# Patient Record
Sex: Female | Born: 1960
Health system: Southern US, Community
[De-identification: ages and names within clinical notes are randomized; demographics above are authoritative.]

## PROBLEM LIST (undated history)

## (undated) DIAGNOSIS — F32A Depression, unspecified: Secondary | ICD-10-CM

## (undated) DIAGNOSIS — F329 Major depressive disorder, single episode, unspecified: Secondary | ICD-10-CM

## (undated) DIAGNOSIS — I10 Essential (primary) hypertension: Secondary | ICD-10-CM

## (undated) DIAGNOSIS — M1990 Unspecified osteoarthritis, unspecified site: Secondary | ICD-10-CM

## (undated) DIAGNOSIS — G43909 Migraine, unspecified, not intractable, without status migrainosus: Secondary | ICD-10-CM

## (undated) DIAGNOSIS — E669 Obesity, unspecified: Secondary | ICD-10-CM

## (undated) HISTORY — PX: BREAST MASS EXCISION: SHX1267

## (undated) HISTORY — PX: ABDOMINAL HYSTERECTOMY: SHX81

## (undated) HISTORY — DX: Unspecified osteoarthritis, unspecified site: M19.90

## (undated) HISTORY — DX: Essential (primary) hypertension: I10

## (undated) HISTORY — DX: Migraine, unspecified, not intractable, without status migrainosus: G43.909

## (undated) HISTORY — DX: Depression, unspecified: F32.A

## (undated) HISTORY — DX: Obesity, unspecified: E66.9

---
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
## 1998-07-04 ENCOUNTER — Emergency Department (HOSPITAL_COMMUNITY): Admission: EM | Admit: 1998-07-04 | Discharge: 1998-07-04 | Payer: Self-pay | Admitting: Emergency Medicine

## 1999-07-12 ENCOUNTER — Encounter: Payer: Self-pay | Admitting: Emergency Medicine

## 1999-07-12 ENCOUNTER — Emergency Department (HOSPITAL_COMMUNITY): Admission: EM | Admit: 1999-07-12 | Discharge: 1999-07-12 | Payer: Self-pay | Admitting: Emergency Medicine

## 2000-04-19 ENCOUNTER — Other Ambulatory Visit: Admission: RE | Admit: 2000-04-19 | Discharge: 2000-04-19 | Payer: Self-pay | Admitting: Obstetrics and Gynecology

## 2003-06-22 ENCOUNTER — Emergency Department (HOSPITAL_COMMUNITY): Admission: EM | Admit: 2003-06-22 | Discharge: 2003-06-23 | Payer: Self-pay | Admitting: Emergency Medicine

## 2003-06-26 ENCOUNTER — Emergency Department (HOSPITAL_COMMUNITY): Admission: EM | Admit: 2003-06-26 | Discharge: 2003-06-26 | Payer: Self-pay | Admitting: Emergency Medicine

## 2003-07-01 ENCOUNTER — Emergency Department (HOSPITAL_COMMUNITY): Admission: EM | Admit: 2003-07-01 | Discharge: 2003-07-01 | Payer: Self-pay | Admitting: *Deleted

## 2003-12-22 ENCOUNTER — Emergency Department (HOSPITAL_COMMUNITY): Admission: EM | Admit: 2003-12-22 | Discharge: 2003-12-22 | Payer: Self-pay | Admitting: Emergency Medicine

## 2007-01-09 ENCOUNTER — Encounter: Admission: RE | Admit: 2007-01-09 | Discharge: 2007-01-09 | Payer: Self-pay | Admitting: Obstetrics and Gynecology

## 2007-02-06 ENCOUNTER — Encounter (INDEPENDENT_AMBULATORY_CARE_PROVIDER_SITE_OTHER): Payer: Self-pay | Admitting: Obstetrics and Gynecology

## 2007-02-06 ENCOUNTER — Ambulatory Visit (HOSPITAL_COMMUNITY): Admission: RE | Admit: 2007-02-06 | Discharge: 2007-02-07 | Payer: Self-pay | Admitting: Obstetrics and Gynecology

## 2008-01-28 ENCOUNTER — Emergency Department (HOSPITAL_COMMUNITY): Admission: EM | Admit: 2008-01-28 | Discharge: 2008-01-29 | Payer: Self-pay | Admitting: Emergency Medicine

## 2009-11-25 ENCOUNTER — Emergency Department (HOSPITAL_COMMUNITY): Admission: EM | Admit: 2009-11-25 | Discharge: 2009-11-25 | Payer: Self-pay | Admitting: Emergency Medicine

## 2009-12-16 ENCOUNTER — Emergency Department (HOSPITAL_COMMUNITY): Admission: EM | Admit: 2009-12-16 | Discharge: 2009-12-16 | Payer: Self-pay | Admitting: Emergency Medicine

## 2009-12-29 ENCOUNTER — Emergency Department (HOSPITAL_COMMUNITY): Admission: EM | Admit: 2009-12-29 | Discharge: 2009-12-29 | Payer: Self-pay | Admitting: Emergency Medicine

## 2010-01-02 ENCOUNTER — Emergency Department (HOSPITAL_COMMUNITY): Admission: EM | Admit: 2010-01-02 | Discharge: 2010-01-02 | Payer: Self-pay | Admitting: Emergency Medicine

## 2010-01-07 ENCOUNTER — Emergency Department (HOSPITAL_COMMUNITY): Admission: EM | Admit: 2010-01-07 | Discharge: 2010-01-07 | Payer: Self-pay | Admitting: Emergency Medicine

## 2010-01-13 ENCOUNTER — Emergency Department (HOSPITAL_COMMUNITY): Admission: EM | Admit: 2010-01-13 | Discharge: 2010-01-13 | Payer: Self-pay | Admitting: Emergency Medicine

## 2010-01-16 ENCOUNTER — Emergency Department (HOSPITAL_COMMUNITY): Admission: EM | Admit: 2010-01-16 | Discharge: 2010-01-16 | Payer: Self-pay | Admitting: Emergency Medicine

## 2010-01-19 ENCOUNTER — Emergency Department (HOSPITAL_COMMUNITY): Admission: EM | Admit: 2010-01-19 | Discharge: 2010-01-19 | Payer: Self-pay | Admitting: Emergency Medicine

## 2010-01-20 ENCOUNTER — Emergency Department (HOSPITAL_COMMUNITY): Admission: EM | Admit: 2010-01-20 | Discharge: 2010-01-20 | Payer: Self-pay | Admitting: Emergency Medicine

## 2010-05-28 ENCOUNTER — Emergency Department (HOSPITAL_COMMUNITY): Admission: EM | Admit: 2010-05-28 | Discharge: 2009-12-26 | Payer: Self-pay | Admitting: Emergency Medicine

## 2010-11-03 NOTE — H&P (Signed)
NAMEALLIAH, Jennifer Waters            ACCOUNT NO.:  [id number]   MEDICAL RECORD NO.:  [id number]          PATIENT TYPE:  AMB   LOCATION:  SDC                           FACILITY:  [facility]   PHYSICIAN:  Janine Limbo, M.D.DATE OF BIRTH:  07-13-1960   DATE OF ADMISSION:  02/06/2007  DATE OF DISCHARGE:                              HISTORY & PHYSICAL   HISTORY OF PRESENT ILLNESS:  Jennifer Waters is a 50 year old female, para  0-1-0-1, who presents for a vaginal hysterectomy.  The patient has been  followed at the [facility]  [facility].  The patient complains of irregular  bleeding, dysmenorrhea, and fibroids.  Her evaluation included an  ultrasound which showed a uterus that measured 10.7 cm x 9.5 cm x 7.5  cm.  Her ovaries appeared normal.  Multiple fibroids were seen with the  largest fibroid measuring 5.3 cm in size.  The patient has had a prior  tubal ligation.  The patient had an endometrial biopsy that showed  benign elements.  Her most recent Pap smear was within normal limits.  Her most recent mammogram was within normal limits.  The patient does  have a past history of herpes simplex virus but she denies any other  sexually transmitted infections.  She has had a tubal ligation in the  past.   OBSTETRICAL HISTORY:  In 1991 the patient had a preterm vaginal  delivery.   DRUG ALLERGIES:  No known drug allergies.  No latex allergies.  No  Betadine allergies.   PAST MEDICAL HISTORY:  The patient has a history of hypertension,  depression/anxiety, migraine headaches, and seasonal allergies.   CURRENT MEDICATIONS:  Atenolol, Effexor, Zyrtec, ibuprofen, and Relpax.   She did have one wisdom tooth removed in 1990.  She has had a tubal  ligation as mentioned above.   SOCIAL HISTORY:  The patient smokes one pack of cigarettes each day.  She drinks alcohol socially.  She denies other recreational drug uses.  She is  currently divorced.   REVIEW OF SYSTEMS:  Please see history of present illness and past  medical history.   FAMILY HISTORY:  The patient has a family history of hypertension,  emphysema, and diabetes.   PHYSICAL EXAM:  Weight is 217 pounds.  Height is 5 feet 8 inches.  HEENT:  Within normal limits.  CHEST:  Show smoker's rhonchi.  HEART:  Regular rate and rhythm.  BREASTS:  Without masses.  ABDOMEN:  Nontender.  NEUROLOGIC:  Grossly normal.  EXTREMITIES:  Grossly normal.  PELVIC EXAM:  External genitalia is normal.  Vagina is normal.  Cervix  is nontender.  The uterus is 10-12 weeks' size and irregular, adnexa, no  masses. Rectovaginal exam confirms.   ASSESSMENT:  1. Fibroid uterus.  2. Menorrhagia.  3. Dysmenorrhea.  4. Hypertension.  5. Depression/anxiety.  6. Migraine headaches.  7. Seasonal allergies.  8. Obesity.   PLAN:  The patient will undergo a vaginal hysterectomy.  She understands  the indications for her surgical procedure as well as the alternative  treatment options.  She accepts  the risks of, but not limited to,  anesthetic complications, bleeding, infection, and possible damage to  the surrounding organs.      Janine Limbo, M.D.  Electronically Signed     AVS/MEDQ  D:  02/01/2007  T:  02/02/2007  Job:  [id number]

## 2010-11-03 NOTE — Op Note (Signed)
NAMELEE-ANNE, FLICKER            ACCOUNT NO.:  [id number]   MEDICAL RECORD NO.:  [id number]          PATIENT TYPE:  AMB   LOCATION:  SDC                           FACILITY:  [facility]   PHYSICIAN:  Janine Limbo, M.D.DATE OF BIRTH:  17-Mar-1961   DATE OF PROCEDURE:  02/06/2007  DATE OF DISCHARGE:                               OPERATIVE REPORT   PREOPERATIVE DIAGNOSIS:  1. Fibroid uterus.  2. Dysmenorrhea.  3. Menorrhagia.   POSTOPERATIVE DIAGNOSIS:  1. Fibroid uterus.  2. Dysmenorrhea.  3. Menorrhagia.   PROCEDURE:  Vaginal hysterectomy with the uterine morcellation.   SURGEON:  Dr. Leonard Schwartz   FIRST ASSISTANT:  Dois Davenport A. Rivard, M.D.   ANESTHETIC:  Is a general.   DISPOSITION:  Ms. Nembhard is a 50 year old female, para 0-1-0-1, who  presents with the above-mentioned diagnosis.  The patient understands  the indications for her surgical procedure and she accepts the risks of,  but not limited to, anesthetic complications, bleeding, infection, and  possible damage to surrounding organs.   FINDINGS:  The patient was noted to have a 14-week size multi fibroid  uterus.  The fallopian tubes and ovaries appeared normal.  The estimated  weight of the uterus was approximately 350 grams.   PROCEDURE:  The patient was taken to the operating room where general  anesthetic was given.  The patient's abdomen, perineum, and vagina were  prepped with multiple layers of Betadine.  A Foley catheter was placed  in the bladder.  The patient was sterilely draped.  Examination under  anesthesia was performed.  The cervix was injected with 20 mL of a  diluted solution of Pitressin and saline.  A circumferential incision  was made around the cervix and the vaginal mucosa was advanced  anteriorly and posteriorly.  The posterior cul-de-sac was sharply  entered.  Alternating from right to left uterosacral ligaments,  paracervical tissues, parametrial tissues, and uterine arteries  were  clamped, cut, sutured, and tied securely.  The anterior cul-de-sac was  sharply entered.  Attempts were made to invert the uterus to the  posterior colpotomy but these attempts were unsuccessful.  We then began  a sequential morcellation of the uterus.  Multiple fibroids were removed  through the posterior colpotomy.  The uterus was cut into multiple small  pieces.  We were finally able to isolate the upper pedicles with Heaney  clamps.  The uterus was transected from the adnexa and the uterus was  removed from the operative field.  The sutures attached to the  uterosacral ligaments were brought out through the vaginal angles.  A  McCall culdoplasty suture was placed in the posterior cul-de-sac  incorporating the uterosacral ligaments bilaterally.  Figure-of-eight  sutures were placed at the right adnexa when a small area of bleeding  was encountered.  Hemostasis was confirmed throughout at this point.  The vaginal cuff was closed using figure-of-eight sutures incorporating  the anterior vaginal mucosa, the anterior peritoneum, the posterior  peritoneum, and the posterior vaginal mucosa.  All sutures were tied  securely.  The McCall culdoplasty suture was tied and the apex of  the  vagina was noted to elevate into the mid pelvis.  Sponge, needle,  instrument counts were correct on two occasions.  Estimated blood loss  for the procedure was 250 mL.  The patient tolerated her procedure well.  She was awakened from her anesthetic and taken to the recovery room in  stable condition.  0 Vicryl suture material used throughout the  procedure.  The uterus (which had been morcellated) was taken to  pathology area for evaluation.  The patient was taken to the recovery  room in stable condition.  She was noted to drain 100 mL of clear yellow  urine.      Janine Limbo, M.D.  Electronically Signed     AVS/MEDQ  D:  02/06/2007  T:  02/06/2007  Job:  [id number]

## 2010-11-06 NOTE — Discharge Summary (Signed)
Jennifer Waters            ACCOUNT NO.:  [id number]   MEDICAL RECORD NO.:  [id number]          PATIENT TYPE:  OIB   LOCATION:  9317                          FACILITY:  [facility]   PHYSICIAN:  Janine Limbo, M.D.DATE OF BIRTH:  12-01-1960   DATE OF ADMISSION:  02/06/2007  DATE OF DISCHARGE:  02/07/2007                               DISCHARGE SUMMARY   DISCHARGE DIAGNOSES:  1. Symptomatic uterine fibroids.  2. Adenomyosis.  3. Menorrhagia.  4. Dysmenorrhea.   OPERATION:  On the day of admission, the patient underwent a vaginal  hysterectomy with uterine morcellation, tolerating procedure well.  The  patient was found to have a 14-week size, multi fibroid uterus along  with fallopian tubes and ovaries that appeared normal.  Estimated weight  of the uterus was approximately 350 grams.   HISTORY OF PRESENT ILLNESS:  Jennifer Waters is a 50 year old female, para  0-1-0-1, who presents for a vaginal hysterectomy because of symptomatic  uterine fibroids.  Please see the patient's dictated History and  Physical examination for details.  Preoperative physical exam showed  weight 217 pounds, height 5 feet 8 inches tall. General exam was within  normal limits.  Pelvic exam:  External genitalia was normal, vagina  normal, cervix nontender.  Uterus was 10 to 12-week size, irregular.  Adnexa was without masses.  Rectovaginal exam confirms.   HOSPITAL COURSE:  On the day of admission, the patient underwent  aforementioned procedures, tolerating them all well.  Postoperative  course was unremarkable with the patient tolerating a postop hemoglobin  of 7.5 (preop hemoglobin 12.8) and, therefore, declining blood  transfusion.  The patient had resumed bowel and bladder function by  postop day #1 and was, therefore, deemed ready for discharge home.   Discharge medications are not available at the time of this dictation.   FOLLOW UP:  The patient is to call [facility] at [phone]  to schedule a 6-week postoperative visit with Dr. Stefano Gaul.   DISCHARGE INSTRUCTIONS:  The patient was given a copy of [address]  [facility] postoperative instruction sheet.  She was further  advised to avoid driving for 1-2 weeks, heavy lifting for 4 weeks,  intercourse for 6 weeks. She may shower. She may walk up steps.  She  should increase her activities slowly.  Her diet was without  restriction.   FINAL PATHOLOGY:  Uterus hysterectomy:  Uterine cervix:  Benign  ectocervical and endocervical mucosa.  Uterine corpus:  Benign  proliferative endometrium, adenomyosis and multiple submucosal  intramural and subserosal leiomyomata.      Jennifer Waters.      Janine Limbo, M.D.  Electronically Signed    EJP/MEDQ  D:  03/04/2007  T:  03/05/2007  Job:  [id number]

## 2011-03-19 LAB — POCT I-STAT, CHEM 8
HCT: 38
Hemoglobin: 12.9
Potassium: 3.7
Sodium: 143

## 2011-03-19 LAB — POCT CARDIAC MARKERS
CKMB, poc: <1 — ABNORMAL LOW
Myoglobin, poc: 37.4

## 2011-04-02 LAB — URINALYSIS, ROUTINE W REFLEX MICROSCOPIC
Bilirubin Urine: NEGATIVE
Ketones, ur: NEGATIVE
Nitrite: NEGATIVE
Protein, ur: NEGATIVE
pH: 5.5

## 2011-04-02 LAB — CBC
HCT: 24 — ABNORMAL LOW
HCT: 37.7
Hemoglobin: 12.8
Hemoglobin: 7.5 — CL
Hemoglobin: 8.1 — ABNORMAL LOW
MCHC: 33.7
MCHC: 33.8
MCV: 91
Platelets: 311
RBC: 2.46 — ABNORMAL LOW
RBC: 2.68 — ABNORMAL LOW
RDW: 13.6
WBC: 9.2

## 2011-04-02 LAB — BASIC METABOLIC PANEL
GFR calc Af Amer: >60
GFR calc non Af Amer: >60
Potassium: 3.7
Sodium: 138

## 2011-04-02 LAB — URINE MICROSCOPIC-ADD ON

## 2014-10-17 ENCOUNTER — Ambulatory Visit (INDEPENDENT_AMBULATORY_CARE_PROVIDER_SITE_OTHER): Payer: BLUE CROSS/BLUE SHIELD | Admitting: Family

## 2014-10-17 ENCOUNTER — Other Ambulatory Visit (INDEPENDENT_AMBULATORY_CARE_PROVIDER_SITE_OTHER): Payer: BLUE CROSS/BLUE SHIELD

## 2014-10-17 ENCOUNTER — Encounter: Payer: Self-pay | Admitting: Family

## 2014-10-17 VITALS — BP 144/100 | HR 92 | Temp 98.1°F | Resp 18 | Ht 69.0 in | Wt 225.0 lb

## 2014-10-17 DIAGNOSIS — I10 Essential (primary) hypertension: Secondary | ICD-10-CM | POA: Insufficient documentation

## 2014-10-17 DIAGNOSIS — M159 Polyosteoarthritis, unspecified: Secondary | ICD-10-CM | POA: Diagnosis not present

## 2014-10-17 DIAGNOSIS — M62838 Other muscle spasm: Secondary | ICD-10-CM | POA: Insufficient documentation

## 2014-10-17 DIAGNOSIS — M1990 Unspecified osteoarthritis, unspecified site: Secondary | ICD-10-CM | POA: Insufficient documentation

## 2014-10-17 LAB — BASIC METABOLIC PANEL
BUN: 16 mg/dL (ref 6–23)
CALCIUM: 9.5 mg/dL (ref 8.4–10.5)
CO2: 30 mEq/L (ref 19–32)
Chloride: 104 mEq/L (ref 96–112)
Creatinine, Ser: 0.9 mg/dL (ref 0.40–1.20)
GFR: 84.26 mL/min (ref >60.00)
Glucose, Bld: 105 mg/dL — ABNORMAL HIGH (ref 70–99)
Potassium: 4.4 mEq/L (ref 3.5–5.1)
SODIUM: 139 meq/L (ref 135–145)

## 2014-10-17 LAB — MAGNESIUM: Magnesium: 2 mg/dL (ref 1.5–2.5)

## 2014-10-17 LAB — PHOSPHORUS: PHOSPHORUS: 3.5 mg/dL (ref 2.3–4.6)

## 2014-10-17 MED ORDER — AMLODIPINE BESYLATE 5 MG PO TABS: 5.0000 mg | ORAL_TABLET | Freq: Every day | ORAL | Status: DC

## 2014-10-17 NOTE — Progress Notes (Signed)
Subjective:    Patient ID: Jennifer Waters, female    DOB: 05/21/1961, 54 y.o.   MRN: [id number]  Chief Complaint  Patient presents with  . Establish Care    states that she gets muscle spasms all over her body, it has been going on for years, happens randomly and getting worse, has pain in hands and joints,     HPI:  Jennifer GIAMMONA is a 54 y.o. female with a PMH of arthritis, hypertension, and migraines who presents today for an office visit to establish care.   1) Muscle spasms - Associated symptom of muscle spasm located all over her body has been going on for several years. Notes that a lot of the muscle cramps/spasms occurs around her abdomen and back. Described as sharp and severity ranges depending upon where it is on her body. Modifying factor includes pushing on them or any over the counter medications. Muscles spasms have gotten worse. Indicates that her caffeine consumption is about 8-10 cups per day.   2) Pain in hands / joints - associated pain located in multiple joints, but specifically around her right thumb which has been going on for a couple of years. Indicates that the right thumb is worse then the left thumb. Denies any modifying factors that make it better or worse.    No Known Allergies   No current outpatient prescriptions on file prior to visit.   No current facility-administered medications on file prior to visit.    Past Medical History  Diagnosis Date  . Arthritis   . Migraines   . Hypertension     Past Surgical History  Procedure Laterality Date  . Abdominal hysterectomy      Family History  Problem Relation Age of Onset  . Arthritis Mother   . Ovarian cancer Mother   . Migraines Mother     History   Social History  . Marital Status: Divorced    Spouse Name: N/A  . Number of Children: 1  . Years of Education: 14   Occupational History  . Supervisor    Social History Main Topics  . Smoking status: Current Some Day Smoker      Types: Cigarettes  . Smokeless tobacco: Never Used  . Alcohol Use: 3.0 oz/week    0 Standard drinks or equivalent, 5 Glasses of wine per week  . Drug Use: No  . Sexual Activity: Yes    Birth Control/ Protection: Surgical   Other Topics Concern  . Not on file   Social History Narrative   Fun: Puzzles, cats   Denies any religious beliefs effecting health care.     Review of Systems  Constitutional: Negative for fever and chills.  Musculoskeletal: Positive for arthralgias.       Positive for muscle cramping.       Objective:    BP 144/100 mmHg  Pulse 92  Temp(Src) 98.1 F (36.7 C) (Oral)  Resp 18  Ht 5\' 9"  (1.753 m)  Wt 225 lb (102.059 kg)  BMI 33.21 kg/m2  SpO2 94% Nursing note and vital signs reviewed.  Physical Exam  Constitutional: She is oriented to person, place, and time. She appears well-developed and well-nourished. No distress.  Cardiovascular: Normal rate, regular rhythm, normal heart sounds and intact distal pulses.   Pulmonary/Chest: Effort normal and breath sounds normal.  Musculoskeletal:  Right thumb: No obvious deformity, discoloration, or edema right thumb noted. Mild tenderness elicited over MCP joint. Some range of motion and strength are  normal. Ligamentous testing is negative.  Neurological: She is alert and oriented to person, place, and time.  Skin: Skin is warm and dry.  Psychiatric: She has a normal mood and affect. Her behavior is normal. Judgment and thought content normal.       Assessment & Plan:

## 2014-10-17 NOTE — Assessment & Plan Note (Signed)
Right thumb symptoms consistent with osteoarthritis. Treat conservatively at this time with nonsteroidal anti-inflammatories as needed, ice after activity, and a thumb spica splint as needed. If symptoms worsen consider imaging and potential steroid injection.

## 2014-10-17 NOTE — Progress Notes (Signed)
Pre visit review using our clinic review tool, if applicable. No additional management support is needed unless otherwise documented below in the visit note. 

## 2014-10-17 NOTE — Patient Instructions (Signed)
Thank you for choosing [facility].  Summary/Instructions:   For your thumb recommend ice after activity and possible thumb spica splint as needed. May also use tylenol, ibuprofen or aleve.   Please stop by the lab on the basement level of the building for your blood work. Your results will be released to [facility] (or called to you) after review, usually within 72 hours after test completion. If any changes need to be made, you will be notified at that same time.  If your symptoms worsen or fail to improve, please contact our office for further instruction, or in case of emergency go directly to the emergency room at the closest medical facility.    Osteoarthritis Osteoarthritis is a disease that causes soreness and inflammation of a joint. It occurs when the cartilage at the affected joint wears down. Cartilage acts as a cushion, covering the ends of bones where they meet to form a joint. Osteoarthritis is the most common form of arthritis. It often occurs in older people. The joints affected most often by this condition include those in the:  Ends of the fingers.  Thumbs.  Neck.  Lower back.  Knees.  Hips. CAUSES  Over time, the cartilage that covers the ends of bones begins to wear away. This causes bone to rub on bone, producing pain and stiffness in the affected joints.  RISK FACTORS Certain factors can increase your chances of having osteoarthritis, including:  Older age.  Excessive body weight.  Overuse of joints.  Previous joint injury. SIGNS AND SYMPTOMS   Pain, swelling, and stiffness in the joint.  Over time, the joint may lose its normal shape.  Small deposits of bone (osteophytes) may grow on the edges of the joint.  Bits of bone or cartilage can break off and float inside the joint space. This may cause more pain and damage. DIAGNOSIS  Your health care provider will do a physical exam and ask about your symptoms. Various tests may be ordered, such  as:  X-rays of the affected joint.  An MRI scan.  Blood tests to rule out other types of arthritis.  Joint fluid tests. This involves using a needle to draw fluid from the joint and examining the fluid under a microscope. TREATMENT  Goals of treatment are to control pain and improve joint function. Treatment plans may include:  A prescribed exercise program that allows for rest and joint relief.  A weight control plan.  Pain relief techniques, such as:  Properly applied heat and cold.  Electric pulses delivered to nerve endings under the skin (transcutaneous electrical nerve stimulation [TENS]).  Massage.  Certain nutritional supplements.  Medicines to control pain, such as:  Acetaminophen.  Nonsteroidal anti-inflammatory drugs (NSAIDs), such as naproxen.  Narcotic or central-acting agents, such as tramadol.  Corticosteroids. These can be given orally or as an injection.  Surgery to reposition the bones and relieve pain (osteotomy) or to remove loose pieces of bone and cartilage. Joint replacement may be needed in advanced states of osteoarthritis. HOME CARE INSTRUCTIONS   Take medicines only as directed by your health care provider.  Maintain a healthy weight. Follow your health care provider's instructions for weight control. This may include dietary instructions.  Exercise as directed. Your health care provider can recommend specific types of exercise. These may include:  Strengthening exercises. These are done to strengthen the muscles that support joints affected by arthritis. They can be performed with weights or with exercise bands to add resistance.  Aerobic activities.  These are exercises, such as brisk walking or low-impact aerobics, that get your heart pumping.  Range-of-motion activities. These keep your joints limber.  Balance and agility exercises. These help you maintain daily living skills.  Rest your affected joints as directed by your health care  provider.  Keep all follow-up visits as directed by your health care provider. SEEK MEDICAL CARE IF:   Your skin turns red.  You develop a rash in addition to your joint pain.  You have worsening joint pain.  You have a fever along with joint or muscle aches. SEEK IMMEDIATE MEDICAL CARE IF:  You have a significant loss of weight or appetite.  You have night sweats. [place] of Arthritis and Musculoskeletal and Skin Diseases: www.niams.SouthExposed.es  [employer] on Aging: http://kim-miller.com/  American College of Rheumatology: www.rheumatology.org Document Released: 06/07/2005 Document Revised: 10/22/2013 Document Reviewed: 02/12/2013 [facility] Patient Information 2015 [place], [place]. This information is not intended to replace advice given to you by your health care provider. Make sure you discuss any questions you have with your health care provider.

## 2014-10-17 NOTE — Assessment & Plan Note (Signed)
No muscle spasms experienced during visit. Obtain basic metabolic panel, magnesium, and phosphorus to rule out metabolic causes. Patient instructed to decrease her caffeine intake and increase her water intake. Also discussed stretching muscles as needed. Follow up pending lab work.

## 2014-10-17 NOTE — Assessment & Plan Note (Signed)
Hypertension not currently treated with medications. She is being treated with atenolol for her headaches, which has had minimal effect on her blood pressure. It remains above goal of 140/90. Start amlodipine 5 mg daily. Follow-up in one month to check blood pressure.

## 2014-10-19 ENCOUNTER — Telehealth: Payer: Self-pay | Admitting: Family

## 2014-10-19 NOTE — Telephone Encounter (Signed)
Please inform the patient that her kidney function and electrolytes are all within the normal limits which does not give [name] a reason for her cramping feeling all over her body. Please have her continue to monitor the cramping for potential causes

## 2014-10-19 NOTE — Telephone Encounter (Signed)
Please inform the patient that her kidney function and electrolytes are all within the normal limits which does not give [name] a reason for her cramping feeling all over her body. Please have her continue to monitor the cramping for potential causes.

## 2014-10-21 NOTE — Telephone Encounter (Signed)
LVM for pt to call back.

## 2014-10-21 NOTE — Telephone Encounter (Signed)
Pt aware of results 

## 2014-10-21 NOTE — Telephone Encounter (Signed)
Pt stated she is at work and not able to answer.  Please call and left it on her VM

## 2015-05-01 ENCOUNTER — Encounter: Payer: Self-pay | Admitting: Family

## 2015-05-01 ENCOUNTER — Ambulatory Visit (INDEPENDENT_AMBULATORY_CARE_PROVIDER_SITE_OTHER): Payer: BLUE CROSS/BLUE SHIELD | Admitting: Family

## 2015-05-01 VITALS — BP 142/98 | HR 98 | Temp 98.2°F | Resp 18 | Ht 69.0 in | Wt 228.1 lb

## 2015-05-01 DIAGNOSIS — M62838 Other muscle spasm: Secondary | ICD-10-CM

## 2015-05-01 DIAGNOSIS — Z23 Encounter for immunization: Secondary | ICD-10-CM | POA: Diagnosis not present

## 2015-05-01 DIAGNOSIS — J309 Allergic rhinitis, unspecified: Secondary | ICD-10-CM | POA: Diagnosis not present

## 2015-05-01 MED ORDER — ALBUTEROL SULFATE HFA 108 (90 BASE) MCG/ACT IN AERS: 2.0000 | INHALATION_SPRAY | Freq: Four times a day (QID) | RESPIRATORY_TRACT | Status: DC | PRN

## 2015-05-01 MED ORDER — HYDROCODONE-HOMATROPINE 5-1.5 MG/5ML PO SYRP: 5.0000 mL | ORAL_SOLUTION | Freq: Three times a day (TID) | ORAL | Status: DC | PRN

## 2015-05-01 MED ORDER — AMLODIPINE BESYLATE 5 MG PO TABS: 5.0000 mg | ORAL_TABLET | Freq: Every day | ORAL | Status: DC

## 2015-05-01 MED ORDER — PREDNISONE 20 MG PO TABS
20.0000 mg | ORAL_TABLET | Freq: Two times a day (BID) | ORAL | Status: DC
Start: 2015-05-01 — End: 2015-05-13

## 2015-05-01 MED ORDER — BENZONATATE 100 MG PO CAPS: 100.0000 mg | ORAL_CAPSULE | Freq: Three times a day (TID) | ORAL | Status: DC | PRN

## 2015-05-01 NOTE — Assessment & Plan Note (Signed)
Previous blood work negative for watchful and imbalance. Continues to experience sporadic muscle spasms in various locations of undetermined origin. Most likely not orthopedic related in nature with next set pattern. Refer to neurology for further assessment and workup. Follow-up if symptoms worsen or fail to improve.

## 2015-05-01 NOTE — Progress Notes (Signed)
Pre visit review using our clinic review tool, if applicable. No additional management support is needed unless otherwise documented below in the visit note. 

## 2015-05-01 NOTE — Progress Notes (Signed)
Subjective:    Patient ID: Jennifer Waters, female    DOB: Sep 01, 1960, 54 y.o.   MRN: [id number]  Chief Complaint  Patient presents with  . Cough    has a cough that started yesterday thinks she has bronchitis, already has bad allergies, did have an issue a few days ago with runny nose    HPI:  Jennifer Waters is a 54 y.o. female who  has a past medical history of Arthritis; Migraines; and Hypertension. and presents today for an acute office visit.   1.) Cough - This is a new problem. Associated symptom of cough, itchy watery eyes, sneezing,  and runny nose has been going on for about 1 day. 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 Modifying factors include diphenhydramine which has not helped with her symptoms. Timing of her symptoms are worse at night.  2.) Muscle Spasms - associated symptoms of transient and sporadic muscle spasms has been going on for a couple years. Previous workup for electrolytes and magnesium was negative. Describes no specific pattern or timing of symptoms. Most recent event was last evening when she experienced muscle spasm in her right side which prevention eased up on its own after several hours.  No Known Allergies   Current Outpatient Prescriptions on File Prior to Visit  Medication Sig Dispense Refill  . atenolol (TENORMIN) 100 MG tablet Take 100 mg by mouth daily.    . divalproex (DEPAKOTE) 250 MG DR tablet Take 250 mg by mouth 2 (two) times daily.    Marland Kitchen venlafaxine (EFFEXOR) 75 MG tablet Take 75 mg by mouth 2 (two) times daily.     No current facility-administered medications on file prior to visit.   Past Medical History  Diagnosis Date  . Arthritis   . Migraines   . Hypertension      Review of Systems  Constitutional: Negative for fever and chills.  HENT: Positive for rhinorrhea, sneezing and sore throat.   Respiratory: Positive for cough. Negative for chest tightness and  shortness of breath.       Objective:    BP 142/98 mmHg  Pulse 98  Temp(Src) 98.2 F (36.8 C) (Oral)  Resp 18  Ht 5\' 9"  (1.753 m)  Wt 228 lb 1.9 oz (103.475 kg)  BMI 33.67 kg/m2  SpO2 99% Nursing note and vital signs reviewed.  Physical Exam  Constitutional: She is oriented to person, place, and time. She appears well-developed and well-nourished. No distress.  HENT:  Right Ear: Hearing, tympanic membrane, external ear and ear canal normal.  Left Ear: Hearing, tympanic membrane, external ear and ear canal normal.  Nose: Nose normal. Right sinus exhibits no maxillary sinus tenderness and no frontal sinus tenderness. Left sinus exhibits no maxillary sinus tenderness and no frontal sinus tenderness.  Mouth/Throat: Uvula is midline, oropharynx is clear and moist and mucous membranes are normal.  Enlarged turbinates noted in nose bilaterally  Neck: Neck supple.  Cardiovascular: Normal rate, regular rhythm, normal heart sounds and intact distal pulses.   Pulmonary/Chest: Effort normal and breath sounds normal.  Lymphadenopathy:    She has no cervical adenopathy.  Neurological: She is alert and oriented to person, place, and time.  Skin: Skin is warm and dry.  Psychiatric: She has a normal mood and affect. Her behavior is normal. Judgment and thought content normal.       Assessment & Plan:   Problem List Items Addressed This Visit  Respiratory   Allergic rhinitis    Symptoms and exam consistent with allergic rhinitis. Start second-generation antihistamine and nasal corticosteroid. Start prednisone. Start Hycodan as needed for cough and sleep. Continue over-the-counter medications as needed for symptom relief and supportive care. Follow-up if symptoms worsen or fail to improve for possible antibiotic therapy.        Other   Muscle spasm    Previous blood work negative for watchful and imbalance. Continues to experience sporadic muscle spasms in various locations of  undetermined origin. Most likely not orthopedic related in nature with next set pattern. Refer to neurology for further assessment and workup. Follow-up if symptoms worsen or fail to improve.      Relevant Orders   Ambulatory referral to Neurology    Other Visit Diagnoses    Encounter for immunization    -  Primary

## 2015-05-01 NOTE — Assessment & Plan Note (Signed)
Symptoms and exam consistent with allergic rhinitis. Start second-generation antihistamine and nasal corticosteroid. Start prednisone. Start Hycodan as needed for cough and sleep. Continue over-the-counter medications as needed for symptom relief and supportive care. Follow-up if symptoms worsen or fail to improve for possible antibiotic therapy.

## 2015-05-01 NOTE — Patient Instructions (Signed)
  Thank you for choosing [facility].  Summary/Instructions:  Your prescription(s) have been submitted to your pharmacy or been printed and provided for you. Please take as directed and contact our office if you believe you are having problem(s) with the medication(s) or have any questions.  Referrals have been made during this visit. You should expect to hear back from our schedulers in about 7-10 days in regards to establishing an appointment with the specialists we discussed.   If your symptoms worsen or fail to improve, please contact our office for further instruction, or in case of emergency go directly to the emergency room at the closest medical facility.    Over the counter flonase and any of : Claritin, Allegra, or Zyrtec.

## 2015-05-06 ENCOUNTER — Telehealth: Payer: Self-pay | Admitting: Family

## 2015-05-06 NOTE — Telephone Encounter (Signed)
Pt states she still is not feeling better and she was told that you would call in an antibiotic. Pharmacy is [facility] on [address].

## 2015-05-07 MED ORDER — AZITHROMYCIN 250 MG PO TABS: ORAL_TABLET | ORAL | Status: DC

## 2015-05-07 NOTE — Addendum Note (Signed)
Addended by: Mauricio Po D on: 05/07/2015 12:46 PM   Modules accepted: Orders

## 2015-05-07 NOTE — Telephone Encounter (Signed)
LVM letting pt know.  

## 2015-05-07 NOTE — Telephone Encounter (Signed)
Medication sent.

## 2015-05-07 NOTE — Telephone Encounter (Signed)
[facility] on [address]. Visit notes do not mention a specific antibiotic that you wanted to use. Please follow up.

## 2015-05-07 NOTE — Telephone Encounter (Signed)
Patient is calling to follow up on this. It appears that the note was closed in error. Patient is awaiting antibiotic to be sent to

## 2015-05-13 ENCOUNTER — Encounter: Payer: Self-pay | Admitting: Internal Medicine

## 2015-05-13 ENCOUNTER — Ambulatory Visit (INDEPENDENT_AMBULATORY_CARE_PROVIDER_SITE_OTHER): Payer: BLUE CROSS/BLUE SHIELD | Admitting: Internal Medicine

## 2015-05-13 VITALS — BP 130/88 | HR 80 | Temp 98.3°F | Resp 16 | Ht 69.0 in | Wt 228.0 lb

## 2015-05-13 DIAGNOSIS — J040 Acute laryngitis: Secondary | ICD-10-CM | POA: Diagnosis not present

## 2015-05-13 NOTE — Patient Instructions (Signed)
Laryngitis  Laryngitis is inflammation of your vocal cords. This causes hoarseness, coughing, loss of voice, sore throat, or a dry throat. Your vocal cords are two bands of muscles that are found in your throat. When you speak, these cords come together and vibrate. These vibrations come out through your mouth as sound. When your vocal cords are inflamed, your voice sounds different.  Laryngitis can be temporary (acute) or long-term (chronic). Most cases of acute laryngitis improve with time. Chronic laryngitis is laryngitis that lasts for more than three weeks.  CAUSES  Acute laryngitis may be caused by:   A viral infection.   Lots of talking, yelling, or singing. This is also called vocal strain.   Bacterial infections.  Chronic laryngitis may be caused by:   Vocal strain.   Injury to your vocal cords.   Acid reflux (gastroesophageal reflux disease or GERD).   Allergies.   Sinus infection.   Smoking.   Alcohol abuse.   Breathing in chemicals or dust.   Growths on the vocal cords.  RISK FACTORS  Risk factors for laryngitis include:   Smoking.   Alcohol abuse.   Having allergies.  SIGNS AND SYMPTOMS  Symptoms of laryngitis may include:   Low, hoarse voice.   Loss of voice.   Dry cough.   Sore throat.   Stuffy nose.  DIAGNOSIS  Laryngitis may be diagnosed by:   Physical exam.   Throat culture.   Blood test.   Laryngoscopy. This procedure allows your health care provider to look at your vocal cords with a mirror or viewing tube.  TREATMENT  Treatment for laryngitis depends on what is causing it. Usually, treatment involves resting your voice and using medicines to soothe your throat. However, if your laryngitis is caused by a bacterial infection, you may need to take antibiotic medicine. If your laryngitis is caused by a growth, you may need to have a procedure to remove it.  HOME CARE INSTRUCTIONS   Drink enough fluid to keep your urine clear or pale yellow.   Breathe in moist air. Use a  humidifier if you live in a dry climate.   Take medicines only as directed by your health care provider.   If you were prescribed an antibiotic medicine, finish it all even if you start to feel better.   Do not smoke cigarettes or electronic cigarettes. If you need help quitting, ask your health care provider.   Talk as little as possible. Also avoid whispering, which can cause vocal strain.   Write instead of talking. Do this until your voice is back to normal.  SEEK MEDICAL CARE IF:   You have a fever.   You have increasing pain.   You have difficulty swallowing.  SEEK IMMEDIATE MEDICAL CARE IF:   You cough up blood.   You have trouble breathing.     This information is not intended to replace advice given to you by your health care provider. Make sure you discuss any questions you have with your health care provider.     Document Released: 06/07/2005 Document Revised: 06/28/2014 Document Reviewed: 11/20/2013  Elsevier Interactive Patient Education 2016 [employer]

## 2015-05-13 NOTE — Progress Notes (Signed)
Subjective:  Patient ID: Jennifer Waters, female    DOB: 1961-01-19  Age: 54 y.o. 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 The other complaints have all resolved.  Outpatient Prescriptions Prior to Visit  Medication Sig Dispense Refill  . albuterol (PROVENTIL HFA;VENTOLIN HFA) 108 (90 BASE) MCG/ACT inhaler Inhale 2 puffs into the lungs every 6 (six) hours as needed for wheezing or shortness of breath. 1 Inhaler 0  . amLODipine (NORVASC) 5 MG tablet Take 1 tablet (5 mg total) by mouth daily. 90 tablet 3  . atenolol (TENORMIN) 100 MG tablet Take 100 mg by mouth daily.    Marland Kitchen azithromycin (ZITHROMAX) 250 MG tablet Take 2 tablets for 1 day and then 1 tablet daily for 4 days. 6 tablet 0  . benzonatate (TESSALON) 100 MG capsule Take 1 capsule (100 mg total) by mouth 3 (three) times daily as needed for cough. 30 capsule 0  . divalproex (DEPAKOTE) 250 MG DR tablet Take 250 mg by mouth 2 (two) times daily.    Marland Kitchen HYDROcodone-homatropine (HYCODAN) 5-1.5 MG/5ML syrup Take 5 mLs by mouth every 8 (eight) hours as needed for cough. 120 mL 0  . predniSONE (DELTASONE) 20 MG tablet Take 1 tablet (20 mg total) by mouth 2 (two) times daily with a meal. 10 tablet 0  . venlafaxine (EFFEXOR) 75 MG tablet Take 75 mg by mouth 2 (two) times daily.     No facility-administered medications prior to visit.    ROS Review of Systems  Constitutional: Negative.  Negative for fever, chills, diaphoresis, appetite change and fatigue.  HENT: Positive for voice change. Negative for congestion, facial swelling, rhinorrhea, sinus pressure and sore throat.   Eyes: Negative.   Respiratory: Negative.  Negative for cough, choking, chest tightness, shortness of breath and stridor.   Cardiovascular: Negative.  Negative for chest  pain, palpitations and leg swelling.  Gastrointestinal: Negative.  Negative for nausea, vomiting, abdominal pain, diarrhea and constipation.  Endocrine: Negative.   Genitourinary: Negative.   Musculoskeletal: Negative.   Skin: Negative.  Negative for rash.  Allergic/Immunologic: Negative.   Neurological: Negative.   Hematological: Negative.  Negative for adenopathy. Does not bruise/bleed easily.  Psychiatric/Behavioral: Negative.     Objective:  BP 130/88 mmHg  Pulse 80  Temp(Src) 98.3 F (36.8 C) (Oral)  Resp 16  Ht 5\' 9"  (1.753 m)  Wt 228 lb (103.42 kg)  BMI 33.65 kg/m2  SpO2 94%  BP Readings from Last 3 Encounters:  05/13/15 130/88  05/01/15 142/98  10/17/14 144/100    Wt Readings from Last 3 Encounters:  05/13/15 228 lb (103.42 kg)  05/01/15 228 lb 1.9 oz (103.475 kg)  10/17/14 225 lb (102.059 kg)    Physical Exam  Constitutional: She is oriented to person, place, and time.  Non-toxic appearance. She does not have a sickly appearance. She does not appear ill. No distress.  Her voice is mildly raspy  HENT:  Mouth/Throat: Oropharynx is clear and moist. No oropharyngeal exudate.  Eyes: Conjunctivae are normal. Right eye exhibits no discharge. Left eye exhibits no discharge. No scleral icterus.  Neck: Normal range of motion. Neck supple. No JVD present. No tracheal deviation present. No thyromegaly present.  Cardiovascular: Normal rate, regular rhythm, normal heart sounds and intact distal pulses.  Exam reveals  no gallop and no friction rub.   No murmur heard. Pulmonary/Chest: Effort normal and breath sounds normal. No stridor. No respiratory distress. She has no wheezes. She has no rales. She exhibits no tenderness.  Abdominal: Soft. Bowel sounds are normal. She exhibits no distension and no mass. There is no tenderness. There is no rebound and no guarding.  Musculoskeletal: Normal range of motion. She exhibits no edema or tenderness.  Lymphadenopathy:    She has no  cervical adenopathy.  Neurological: She is oriented to person, place, and time.  Skin: Skin is warm and dry. No rash noted. She is not diaphoretic. No erythema. No pallor.    Lab Results  Component Value Date   WBC 19.7* 02/07/2007   HGB 12.9 01/29/2008   HCT 38.0 01/29/2008   PLT 229 02/07/2007   GLUCOSE 105* 10/17/2014   NA 139 10/17/2014   K 4.4 10/17/2014   CL 104 10/17/2014   CREATININE 0.90 10/17/2014   BUN 16 10/17/2014   CO2 30 10/17/2014    No results found.  Assessment & Plan:   Earnstine was seen today for laryngitis.  Diagnoses and all orders for this visit:  Laryngitis- she will rest her voice for 2 weeks, she was asked to quit smoking, she will gargle with warm salty water, she will let me know if the laryngitis does not resolve, if it doesn't then I will refer to ENT for evaluation   I have discontinued Ms. Hinnant's venlafaxine, predniSONE, benzonatate, HYDROcodone-homatropine, and azithromycin. I am also having her maintain her atenolol, albuterol, amLODipine, divalproex, and venlafaxine XR.  Meds ordered this encounter  Medications  . divalproex (DEPAKOTE) 500 MG DR tablet    Sig:   . venlafaxine XR (EFFEXOR-XR) 75 MG 24 hr capsule    Sig:      Follow-up: Return in about 3 weeks (around 06/03/2015).  Scarlette Calico, MD

## 2015-05-13 NOTE — Progress Notes (Signed)
Pre visit review using our clinic review tool, if applicable. No additional management support is needed unless otherwise documented below in the visit note. 

## 2015-05-22 ENCOUNTER — Telehealth: Payer: Self-pay | Admitting: Family

## 2015-05-22 DIAGNOSIS — J040 Acute laryngitis: Secondary | ICD-10-CM

## 2015-05-22 NOTE — Telephone Encounter (Signed)
Pt was seen by Dr. Ronnald Ramp on 11/22 and was taken off work till 12/5. She states her voice still is not better and is requesting another work not extending her time off. Please advise

## 2015-05-22 NOTE — Telephone Encounter (Signed)
No, either she goes back to work or she sees an ENT about the laryngitis

## 2015-05-22 NOTE — Telephone Encounter (Signed)
Ok to extend until what date? i will type letter.

## 2015-05-23 NOTE — Telephone Encounter (Signed)
Pt has called back regarding the encounters below. She would like a referral to an ENT

## 2015-05-26 NOTE — Telephone Encounter (Signed)
Referral made 

## 2015-05-27 NOTE — Telephone Encounter (Signed)
Pt aware.

## 2015-08-01 ENCOUNTER — Encounter: Payer: Self-pay | Admitting: Neurology

## 2015-08-01 ENCOUNTER — Other Ambulatory Visit (INDEPENDENT_AMBULATORY_CARE_PROVIDER_SITE_OTHER): Payer: BLUE CROSS/BLUE SHIELD

## 2015-08-01 ENCOUNTER — Ambulatory Visit (INDEPENDENT_AMBULATORY_CARE_PROVIDER_SITE_OTHER): Payer: BLUE CROSS/BLUE SHIELD | Admitting: Neurology

## 2015-08-01 VITALS — BP 130/90 | HR 85 | Wt 230.0 lb

## 2015-08-01 DIAGNOSIS — R252 Cramp and spasm: Secondary | ICD-10-CM | POA: Diagnosis not present

## 2015-08-01 DIAGNOSIS — F17200 Nicotine dependence, unspecified, uncomplicated: Secondary | ICD-10-CM | POA: Diagnosis not present

## 2015-08-01 DIAGNOSIS — E669 Obesity, unspecified: Secondary | ICD-10-CM

## 2015-08-01 DIAGNOSIS — R202 Paresthesia of skin: Secondary | ICD-10-CM | POA: Diagnosis not present

## 2015-08-01 LAB — SEDIMENTATION RATE: Sed Rate: 3 mm/hr (ref 0–22)

## 2015-08-01 LAB — VITAMIN B12: Vitamin B-12: 862 pg/mL (ref 211–911)

## 2015-08-01 LAB — TSH: TSH: 0.93 u[IU]/mL (ref 0.35–4.50)

## 2015-08-01 LAB — CK: CK TOTAL: 213 U/L — AB (ref 7–177)

## 2015-08-01 MED ORDER — CYCLOBENZAPRINE HCL 5 MG PO TABS: 5.0000 mg | ORAL_TABLET | Freq: Every evening | ORAL | Status: DC | PRN

## 2015-08-01 NOTE — Progress Notes (Signed)
[facility] Clinic Note - Initial Visit   Date: 08/01/2015  Jennifer Waters MRN: [id number] DOB: 1960/10/29   Dear Jennifer Po, NP:  Thank you for your kind referral of Jennifer Waters for consultation of muscle cramps. Although her history is well known to you, please allow [name] to reiterate it for the purpose of our medical record. The patient was accompanied to the clinic by self.    History of Present Illness: Jennifer Waters is a 55 y.o. right-handed African [place] female with migraines, hypertension, tobacco user, and arthritis presenting for evaluation of muscle cramps.    Starting around in her 50s, she began having sudden and severe muscle cramps involving the abdomen and feet.  There involve her abdomen most commonly, but also affects her hands and feet and have been worsening over the years.   Her muscle spasms are painful and prevent her from staying active.  She tries to massage and rub the muscles to relax, but can last 5 minutes.  Activity usually triggers the spasms.  The frequency varies and can occur once every few weeks to several times per day.  She stays well-hydrated, but admits to drinking four large cups of coffee daily.  She denies any stiffness, muscle spasm, dysarthria, dysphagia, or dark-colored urine.  There is no family history of neuromuscular disorders.  In early 2017, she began experiencing tingling sensation over the right from her fingertips to her elbow.  Symptoms are intermittent throughout the day.  Denies any weakness or neck pain.   She sees Dr. Melton Alar for her migraines and takes depakote 517m BID and venlafaxine 761mdaily.  Out-side paper records, electronic medical record, and images have been reviewed where available and summarized as:  Labs Na 139, K 4.4, Chl 104, BUN 16, Cr 0.90, Mg 2.0, Phos 3.5  XR cervical spine 12/23/2003: Straightening of the normal cervical lordosis without evidence for acute bony  abnormality. This can be related to patient positioning, muscle spasm, or soft tissue injury.      Past Medical History  Diagnosis Date  . Arthritis   . Migraines   . Hypertension     Past Surgical History  Procedure Laterality Date  . Abdominal hysterectomy       Medications:  Outpatient Encounter Prescriptions as of 08/01/2015  Medication Sig Note  . amLODipine (NORVASC) 5 MG tablet Take 1 tablet (5 mg total) by mouth daily.   . Marland Kitchentenolol (TENORMIN) 100 MG tablet Take 100 mg by mouth daily.   . divalproex (DEPAKOTE) 500 MG DR tablet  05/13/2015: Received from: External Pharmacy  . omeprazole (PRILOSEC) 40 MG capsule take 1 capsule by mouth once daily BEFORE DINNER 08/01/2015: Received from: External Pharmacy  . venlafaxine XR (EFFEXOR-XR) 75 MG 24 hr capsule  05/13/2015: Received from: External Pharmacy  . [DISCONTINUED] albuterol (PROVENTIL HFA;VENTOLIN HFA) 108 (90 BASE) MCG/ACT inhaler Inhale 2 puffs into the lungs every 6 (six) hours as needed for wheezing or shortness of breath.   . cyclobenzaprine (FLEXERIL) 5 MG tablet Take 1 tablet (5 mg total) by mouth at bedtime as needed for muscle spasms.    No facility-administered encounter medications on file as of 08/01/2015.     Allergies: No Known Allergies  Family History: Family History  Problem Relation Age of Onset  . Arthritis Mother   . Ovarian cancer Mother   . Migraines Mother     Social History: Social History  Substance Use Topics  . Smoking status: Current Every  Day Smoker -- 0.50 packs/day for 20 years    Types: Cigarettes  . Smokeless tobacco: Never Used  . Alcohol Use: 3.0 oz/week    0 Standard drinks or equivalent, 5 Glasses of wine per week   Social History   Social History Narrative   Fun: Puzzles, cats   Denies any religious beliefs effecting health care. Lives with daughter in a one story home.  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  Education: some college.                                                                +    Review of Systems:  CONSTITUTIONAL: No fevers, chills, night sweats, or weight loss.   EYES: No visual changes or eye pain ENT: No hearing changes.  No history of nose bleeds.   RESPIRATORY: No cough, wheezing and shortness of breath.   CARDIOVASCULAR: Negative for chest pain, and palpitations.   GI: Negative for abdominal discomfort, blood in stools or black stools.  No recent change in bowel habits.   GU:  No history of incontinence.   MUSCLOSKELETAL: No history of joint pain or swelling.  No myalgias.   SKIN: Negative for lesions, rash, and itching.   HEMATOLOGY/ONCOLOGY: Negative for prolonged bleeding, bruising easily, and swollen nodes.  No history of cancer.   ENDOCRINE: Negative for cold or heat intolerance, polydipsia or goiter.   PSYCH:  No depression or anxiety symptoms.   NEURO: As Above.   Vital Signs:  BP 130/90 mmHg  Pulse 85  Wt 230 lb (104.327 kg)  SpO2 96% Pain Scale: 0 on a scale of 0-10   General Medical Exam:   General:  Well appearing, comfortable.   Eyes/ENT: see cranial nerve examination.   Neck: No masses appreciated.  Full range of motion without tenderness.  No carotid bruits. Respiratory:  Clear to auscultation, good air entry bilaterally.   Cardiac:  Regular rate and rhythm, no murmur.   Extremities:  No deformities, edema, or skin discoloration.  Skin:  No rashes or lesions.  Neurological Exam: MENTAL STATUS including orientation to time, place, person, recent and remote memory, attention span and concentration, language, and fund of knowledge is normal.  Speech is not dysarthric.  CRANIAL NERVES: II:  No visual field defects.  Unremarkable fundi.   III-IV-VI: Pupils equal round and reactive to light.  Normal conjugate, extra-ocular eye movements in all directions of gaze.  No nystagmus.  No ptosis.   V:  Normal facial sensation.    VII:  Normal facial symmetry and movements.  No pathologic facial  reflexes.  VIII:  Normal hearing and vestibular function.   IX-X:  Normal palatal movement.   XI:  Normal shoulder shrug and head rotation.   XII:  Normal tongue strength and range of motion, no deviation or fasciculation.  MOTOR:  No atrophy, fasciculations or abnormal movements.  No pronator drift.  Tone is normal.  No eyelid or grip myotonia.  No percussion myotonia.  Right Upper Extremity:    Left Upper Extremity:    Deltoid  5/5   Deltoid  5/5   Biceps  5/5   Biceps  5/5   Triceps  5/5   Triceps  5/5   Wrist extensors  5/5  Wrist extensors  5/5   Wrist flexors  5/5   Wrist flexors  5/5   Finger extensors  5/5   Finger extensors  5/5   Finger flexors  5/5   Finger flexors  5/5   Dorsal interossei  5/5   Dorsal interossei  5/5   Abductor pollicis  5/5   Abductor pollicis  5/5   Tone (Ashworth scale)  0  Tone (Ashworth scale)  0   Right Lower Extremity:    Left Lower Extremity:    Hip flexors  5/5   Hip flexors  5/5   Hip extensors  5/5   Hip extensors  5/5   Knee flexors  5/5   Knee flexors  5/5   Knee extensors  5/5   Knee extensors  5/5   Dorsiflexors  5/5   Dorsiflexors  5/5   Plantarflexors  5/5   Plantarflexors  5/5   Toe extensors  5/5   Toe extensors  5/5   Toe flexors  5/5   Toe flexors  5/5   Tone (Ashworth scale)  0  Tone (Ashworth scale)  0   MSRs:  Right                                                                 Left brachioradialis 2+  brachioradialis 2+  biceps 2+  biceps 2+  triceps 2+  triceps 2+  patellar 2+  patellar 2+  ankle jerk 2+  ankle jerk 2+  Hoffman no  Hoffman no  plantar response down  plantar response down   SENSORY:  Normal and symmetric perception of light touch, pinprick, vibration, and proprioception.  Romberg's sign absent.   COORDINATION/GAIT: Normal finger-to- nose-finger and heel-to-shin.  Intact rapid alternating movements bilaterally.  Able to rise from a chair without using arms.  Gait narrow based and stable. Tandem and  stressed gait intact.    IMPRESSION: Ms. Borowski is a delightful 55 year-old female referred for evaluation of muscle cramps and new complaints of right arm paresthesias.  Her exam is entirely normal.  There is no evidence myopathy or spasticity to suggest cramps are from muscle disease or spinal stenosis.  Medications were reviewed and are not associated with muscle cramps.  Her electrolytes are within normal limits.  I explained that sometimes it is difficult to find the underlying etiology of muscle cramps, but for completeness I will check labs for potential causes.  Because of her right hand paresthesias, NCS/EMG will be done.    Other options:   neurontin 369m q HS/elavil 25-100 q HS, Riboflavin 1071m Benadryl 2551mI also spent a lot of time counseling her on tobacco cessation and weight loss strategies.  PLAN: 1.  Check ESR, TSH, CK, aldolase, Vitamin E excess, selenium deficiency, vitamin B12 2.  NCS/EMG of the right arm 3.  Start magnesium oxide every day for 5 day, then increase to one tablet twice daily 4.  Try tonic water 2-3 glasses daily 5.  Start flexeril 5mg24m bedtime 6.  Start yoga or pilates exercises 7.  Stay well hydrated and limit coffee to two cups daily 8.  Smoking cessation instruction/counseling given:  counseled patient on the dangers of tobacco use, advised patient to stop smoking, and reviewed strategies  to maximize success   Return to clinic in 3 months   The duration of this appointment visit was 60 minutes of face-to-face time with the patient.  Greater than 50% of this time was spent in counseling, explanation of diagnosis, planning of further management, and coordination of care.   Thank you for allowing me to participate in patient's care.  If I can answer any additional questions, I would be pleased to do so.    Sincerely,    Calin Ellery K. Posey Pronto, DO

## 2015-08-01 NOTE — Patient Instructions (Addendum)
1.  Start magnesium oxide every day for 5 day, then increase to one tablet twice daily 2.  You can also try tonic water 2-3 glasses daily 3.  Start flexeril 5mg  at bedtime 4.  Check blood work 5.  Start yoga or pilates exercises 6.  Stay well hydrated 7.  NCS/EMG of the right arm 8.  Start working with your daughter to quit smoking!  Return to clinic 52-months

## 2015-08-03 LAB — SELENIUM SERUM: Selenium, Blood: 109 mcg/L (ref 63–160)

## 2015-08-04 LAB — ALDOLASE: Aldolase: 6.2 U/L (ref <=8.1)

## 2015-08-05 ENCOUNTER — Ambulatory Visit (INDEPENDENT_AMBULATORY_CARE_PROVIDER_SITE_OTHER): Payer: BLUE CROSS/BLUE SHIELD | Admitting: Neurology

## 2015-08-05 DIAGNOSIS — R202 Paresthesia of skin: Secondary | ICD-10-CM

## 2015-08-05 DIAGNOSIS — R252 Cramp and spasm: Secondary | ICD-10-CM

## 2015-08-05 LAB — VITAMIN B6: Vitamin B6: 11.8 ng/mL (ref 2.1–21.7)

## 2015-08-05 NOTE — Procedures (Signed)
[facility]  [address], [address]  [place], [place] [address] Tel: [phone] Fax:  [phone] Test Date:  08/05/2015  Patient: Jennifer Waters DOB: Feb 20, 1961 Physician: Narda Amber  Sex: Female Height: 5\' 9"  Ref Phys: Narda Amber  ID#: [id number] Temp: 33.2C Technician: Jerilynn Mages. Dean   Patient Complaints: This is a 55 year old female referred for evaluation of right arm tingling and generalized muscle cramps.  NCV & EMG Findings: Extensive electrodiagnostic testing of the right upper extremity shows: 1. Right median, ulnar, radial, and palmer sensory responses are within normal limits. 2. Right median and ulnar motor responses are within normal limits. 3. Chronic motor axon loss changes are seen affecting C6 myotomes right, without accompanied active denervation.    Impression: 1. Chronic C6 radiculopathy affecting the right upper extremity; mild in degree electrically. 2. There is no evidence of myopathy or carpal tunnel syndrome affecting the right upper extremity.   _____________________________ Narda Amber, D.O.    Nerve Conduction Studies Anti Sensory Summary Table   Stim Site NR Peak (ms) Norm Peak (ms) P-T Amp (V) Norm P-T Amp  Right Median Anti Sensory (2nd Digit)  Wrist    3.0 <3.6 32.1 >15  Right Ulnar Anti Sensory (5th Digit)  Wrist    2.9 <3.1 27.1 >10  Right Radial Anti Sensory (Base 1st Digit)  Wrist    1.6 <2.7 37.3 >14   Motor Summary Table   Stim Site NR Onset (ms) Norm Onset (ms) O-P Amp (mV) Norm O-P Amp Site1 Site2 Delta-0 (ms) Dist (cm) Vel (m/s) Norm Vel (m/s)  Right Median Motor (Abd Poll Brev)  Wrist    3.8 <4.0 10.6 >6 Elbow Wrist 4.6 25.0 54 >50  Elbow    8.4  10.0         Right Ulnar Motor (Abd Dig Minimi)  Wrist    2.6 <3.1 11.3 >7 B Elbow Wrist 4.4 25.0 57 >50  B Elbow    7.0  10.7  A Elbow B Elbow 1.5 10.0 67 >50  A Elbow    8.5  10.2          Comparison Summary Table   Site NR Peak (ms) Norm Peak (ms) P-T Amp  (V) Site1 Site2 Delta-P (ms) Norm Delta (ms)  Right Median/Ulnar Palm Comparison (Wrist - 8cm)  Median Palm    2.0 <2.2 50.3 Median Palm Ulnar Palm 0.0   Ulnar Palm    2.0 <2.2 19.2        EMG   Side Muscle Ins Act Fibs Psw Fasc Number Recrt Dur Dur. Amp Amp. Poly Poly. Comment  Right 1stDorInt Nml Nml Nml Nml Nml Nml Nml Nml Nml Nml Nml Nml N/A  Right Ext Indicis Nml Nml Nml Nml Nml Nml Nml Nml Nml Nml Nml Nml N/A  Right PronatorTeres Nml Nml Nml Nml 1- Rapid Few 1+ Few 1+ Nml Nml N/A  Right Biceps Nml Nml Nml Nml 1- Rapid Some 1+ Nml Nml Nml Nml N/A  Right Triceps Nml Nml Nml Nml 1- Rapid Few 1+ Nml Nml Nml Nml N/A  Right Deltoid Nml Nml Nml Nml Nml Nml Nml Nml Nml Nml Nml Nml N/A      Waveforms:

## 2015-08-06 LAB — VITAMIN E
GAMMA-TOCOPHEROL (VIT E): 2.3 mg/L (ref <=4.3)
Vitamin E (Alpha Tocopherol): 10.4 mg/L (ref 5.7–19.9)

## 2015-10-31 ENCOUNTER — Ambulatory Visit: Payer: BLUE CROSS/BLUE SHIELD | Admitting: Neurology

## 2015-12-11 ENCOUNTER — Ambulatory Visit (INDEPENDENT_AMBULATORY_CARE_PROVIDER_SITE_OTHER): Payer: BLUE CROSS/BLUE SHIELD | Admitting: Family

## 2015-12-11 ENCOUNTER — Encounter: Payer: Self-pay | Admitting: Family

## 2015-12-11 VITALS — BP 104/74 | HR 92 | Temp 98.1°F | Resp 16 | Ht 69.0 in | Wt 231.0 lb

## 2015-12-11 DIAGNOSIS — M546 Pain in thoracic spine: Secondary | ICD-10-CM

## 2015-12-11 DIAGNOSIS — M549 Dorsalgia, unspecified: Secondary | ICD-10-CM

## 2015-12-11 DIAGNOSIS — R0683 Snoring: Secondary | ICD-10-CM

## 2015-12-11 DIAGNOSIS — J309 Allergic rhinitis, unspecified: Secondary | ICD-10-CM

## 2015-12-11 DIAGNOSIS — K219 Gastro-esophageal reflux disease without esophagitis: Secondary | ICD-10-CM | POA: Diagnosis not present

## 2015-12-11 MED ORDER — IBUPROFEN-FAMOTIDINE 800-26.6 MG PO TABS: 1.0000 | ORAL_TABLET | Freq: Three times a day (TID) | ORAL | Status: DC | PRN

## 2015-12-11 MED ORDER — FLUTICASONE PROPIONATE 50 MCG/ACT NA SUSP
2.0000 | Freq: Every day | NASAL | Status: DC
Start: 2015-12-11 — End: 2017-02-15

## 2015-12-11 MED ORDER — OMEPRAZOLE 20 MG PO CPDR: 20.0000 mg | DELAYED_RELEASE_CAPSULE | Freq: Every day | ORAL | Status: DC

## 2015-12-11 MED ORDER — DICLOFENAC SODIUM 2 % TD SOLN: 1.0000 "application " | Freq: Two times a day (BID) | TRANSDERMAL | Status: DC | PRN

## 2015-12-11 NOTE — Patient Instructions (Addendum)
Thank you for choosing [facility].  Summary/Instructions:  Please continue to take your medications as prescribed.  Ice 2-3 times per day as needed and after activity.  Stretching and exercises multiple times per day.  Duexis - 3x per day as needed.  Pennsaid - 2x per day as needed.   Check on ergonomics at home.  They will call to schedule your appointment for a sleep study.   Your prescription(s) have been submitted to your pharmacy or been printed and provided for you. Please take as directed and contact our office if you believe you are having problem(s) with the medication(s) or have any questions.  If your symptoms worsen or fail to improve, please contact our office for further instruction, or in case of emergency go directly to the emergency room at the closest medical facility.   Mid-Back Strain With Rehab  A strain is an injury in which a tendon or muscle is torn. The muscles and tendons of the mid-back are vulnerable to strains. However, these muscles and tendons are very strong and require a great force to be injured. The muscles of the mid-back are responsible for stabilizing the spinal column, as well as spinal twisting (rotation). Strains are classified into three categories. Grade 1 strains cause pain, but the tendon is not lengthened. Grade 2 strains include a lengthened ligament, due to the ligament being stretched or partially ruptured. With grade 2 strains there is still function, although the function may be decreased. Grade 3 strains involve a complete tear of the tendon or muscle, and function is usually impaired. SYMPTOMS   Pain in the middle of the back.  Pain that may affect only one side, and is worse with movement.  Muscle spasms, and often swelling in the back.  Loss of strength of the back muscles.  Crackling sound (crepitation) when the muscles are touched. CAUSES  Mid-back strains occur when a force is placed on the muscles or tendons that  is greater than they can handle. Common causes of injury include:  Ongoing overuse of the muscle-tendon units in the middle back, usually from incorrect body posture.  A single violent injury or force applied to the back. RISK INCREASES WITH:  Sports that involve twisting forces on the spine or a lot of bending at the waist (football, rugby, weightlifting, bowling, golf, tennis, speed skating, racquetball, swimming, running, gymnastics, diving).  Poor strength and flexibility.  Failure to warm up properly before activity.  Family history of low back pain or disk disorders.  Previous back injury or surgery (especially fusion). PREVENTION  Learn and use proper sports technique.  Warm up and stretch properly before activity.  Allow for adequate recovery between workouts.  Maintain physical fitness:  Strength, flexibility, and endurance.  Cardiovascular fitness. PROGNOSIS  If treated properly, mid-back strains usually heal within 6 weeks. RELATED COMPLICATIONS   Frequently recurring symptoms, resulting in a chronic problem. Properly treating the problem the first time decreases frequency of recurrence.  Chronic inflammation, scarring, and partial muscle-tendon tear.  Delayed healing or resolution of symptoms, especially if activity is resumed too soon.  Prolonged disability. TREATMENT Treatment first involves the use of ice and medicine, to reduce pain and inflammation. As the pain begins to subside, you may begin strengthening and stretching exercises to improve body posture and sport technique. These exercises may be performed at home or with a therapist. Severe injuries may require referral to a therapist for further evaluation and treatment, such as ultrasound. Corticosteroid injections may be  given to help reduce inflammation. Biofeedback (watching monitors of your body processes) and psychotherapy may also be prescribed. Prolonged bed rest is felt to do more harm than good.  Massage may help break the muscle spasms. Sometimes, an injection of cortisone, with or without local anesthetics, may be given to help relieve the pain and spasms. MEDICATION   If pain medicine is needed, nonsteroidal anti-inflammatory medicines (aspirin and ibuprofen), or other minor pain relievers (acetaminophen), are often advised.  Do not take pain medicine for 7 days before surgery.  Prescription pain relievers may be given, if your caregiver thinks they are needed. Use only as directed and only as much as you need.  Ointments applied to the skin may be helpful.  Corticosteroid injections may be given by your caregiver. These injections should be reserved for the most serious cases, because they may only be given a certain number of times. HEAT AND COLD:   Cold treatment (icing) should be applied for 10 to 15 minutes every 2 to 3 hours for inflammation and pain, and immediately after activity that aggravates your symptoms. Use ice packs or an ice massage.  Heat treatment may be used before performing stretching and strengthening activities prescribed by your caregiver, physical therapist, or athletic trainer. Use a heat pack or a warm water soak. SEEK IMMEDIATE MEDICAL CARE IF:  Symptoms get worse or do not improve in 2 to 4 weeks, despite treatment.  You develop numbness, weakness, or loss of bowel or bladder function.  New, unexplained symptoms develop. (Drugs used in treatment may produce side effects.) EXERCISES RANGE OF MOTION (ROM) AND STRETCHING EXERCISES - Mid-Back Strain These exercises may help you when beginning to rehabilitate your injury. In order to successfully resolve your symptoms, you must improve your posture. These exercises are designed to help reduce the forward-head and rounded-shoulder posture which contributes to this condition. Your symptoms may resolve with or without further involvement from your physician, physical therapist or athletic trainer. While  completing these exercises, remember:   Restoring tissue flexibility helps normal motion to return to the joints. This allows healthier, less painful movement and activity.  An effective stretch should be held for at least 30 seconds.  A stretch should never be painful. You should only feel a gentle lengthening or release in the stretched tissue. STRETCH - Axial Extension  Stand or sit on a firm surface. Assume a good posture: chest up, shoulders drawn back, stomach muscles slightly tense, knees unlocked (if standing) and feet hip width apart.  Slowly retract your chin, so your head slides back and your chin slightly lowers. Continue to look straight ahead.  You should feel a gentle stretch in the back of your head. Be certain not to feel an aggressive stretch since this can cause headaches later.  Hold for __________ seconds. Repeat __________ times. Complete this exercise __________ times per day. RANGE OF MOTION- Upper Thoracic Extension  Sit on a firm chair with a high back. Assume a good posture: chest up, shoulders drawn back, abdominal muscles slightly tense, and feet hip width apart. Place a small pillow or folded towel in the curve of your lower back, if you are having difficulty maintaining good posture.  Gently brace your neck with your hands, allowing your arms to rest on your chest.  Continue to support your neck and slowly extend your back over the chair. You will feel a stretch across your upper back.  Hold __________ seconds. Slowly return to the starting position. Repeat __________  times. Complete this exercise __________ times per day. RANGE OF MOTION- Mid-Thoracic Extension  Roll a towel so that it is about 4 inches in diameter.  Position the towel lengthwise. Lay on the towel so that your spine, but not your shoulder blades, are supported.  You should feel your mid-back arching toward the floor. To increase the stretch, extend your arms away from your  body.  Hold for __________ seconds. Repeat exercise __________ times, __________ times per day. STRENGTHENING EXERCISES - Mid-Back Strain These exercises may help you when beginning to rehabilitate your injury. They may resolve your symptoms with or without further involvement from your physician, physical therapist or athletic trainer. While completing these exercises, remember:   Muscles can gain both the endurance and the strength needed for everyday activities through controlled exercises.  Complete these exercises as instructed by your physician, physical therapist or athletic trainer. Increase the resistance and repetitions only as guided by your caregiver.  You may experience muscle soreness or fatigue, but the pain or discomfort you are trying to eliminate should never worsen during these exercises. If this pain does worsen, stop and make certain you are following the directions exactly. If the pain is still present after adjustments, discontinue the exercise until you can discuss the trouble with your caregiver. STRENGTHENING - Quadruped, Opposite UE/LE Lift  Assume a hands and knees position on a firm surface. Keep your hands under your shoulders and your knees under your hips. You may place padding under your knees for comfort.  Find your neutral spine and gently tense your abdominal muscles so that you can maintain this position. Your shoulders and hips should form a rectangle that is parallel with the floor and is not twisted.  Keeping your trunk steady, lift your right hand no higher than your shoulder and then your left leg no higher than your hip. Make sure you are not holding your breath. Hold this position __________ seconds.  Continuing to keep your abdominal muscles tense and your back steady, slowly return to your starting position. Repeat with the opposite arm and leg. Repeat __________ times. Complete this exercise __________ times per day.  STRENGTH - Shoulder  Extensors  Secure a rubber exercise band or tubing to a fixed object (table, pole) so that it is at the height of your shoulders when you are either standing, or sitting on a firm armless chair.  With a thumbs-up grip, grasp an end of the band in each hand. Straighten your elbows and lift your hands straight in front of you at shoulder height. Step back away from the secured end of band, until it becomes tense.  Squeezing your shoulder blades together, pull your hands down to the sides of your thighs. Do not allow your hands to go behind you.  Hold for __________ seconds. Slowly ease the tension on the band, as you reverse the directions and return to the starting position. Repeat __________ times. Complete this exercise __________ times per day.  STRENGTH - Horizontal Abductors Choose one of the two positions to complete this exercise. Prone: lying on stomach:  Lie on your stomach on a firm surface so that your right / left arm overhangs the edge. Rest your forehead on your opposite forearm. With your palm facing the floor and your elbow straight, hold a __________ weight in your hand.  Squeeze your right / left shoulder blade to your mid-back spine and then slowly raise your arm to the height of the bed.  Hold for __________  seconds. Slowly reverse the directions and return to the starting position, controlling the weight as you lower your arm. Repeat __________ times. Complete this exercise __________ times per day. Standing:   Secure a rubber exercise band or tubing, so that it is at the height of your shoulders when you are either standing, or sitting on a firm armless chair.  Grasp an end of the band in each hand and have your palms face each other. Straighten your elbows and lift your hands straight in front of you at shoulder height. Step back away from the secured end of band, until it becomes tense.  Squeeze your shoulder blades together. Keeping your elbows locked and your hands  at shoulder height, spread your arms apart, forming a "T" shape with your body. Hold __________ seconds. Slowly ease the tension on the band, as you reverse the directions and return to the starting position. Repeat __________ times. Complete this exercise __________ times per day. STRENGTH - Scapular Retractors and External Rotators, Rowing  Secure a rubber exercise band or tubing, so that it is at the height of your shoulders when you are either standing, or sitting on a firm armless chair.  With a palm-down grip, grasp an end of the band in each hand. Straighten your elbows and lift your hands straight in front of you at shoulder height. Step back away from the secured end of band, until it becomes tense.  Step 1: Squeeze your shoulder blades together. Bending your elbows, draw your hands to your chest as if you are rowing a boat. At the end of this motion, your hands and elbow should be at shoulder height and your elbows should be out to your sides.  Step 2: Rotate your shoulder to raise your hands above your head. Your forearms should be vertical and your upper arms should be horizontal.  Hold for __________ seconds. Slowly ease the tension on the band, as you reverse the directions and return to the starting position. Repeat __________ times. Complete this exercise __________ times per day.  POSTURE AND BODY MECHANICS CONSIDERATIONS - Mid-Back Strain Keeping correct posture when sitting, standing or completing your activities will reduce the stress put on different body tissues, allowing injured tissues a chance to heal and limiting painful experiences. The following are general guidelines for improved posture. Your physician or physical therapist will provide you with any instructions specific to your needs. While reading these guidelines, remember:  The exercises prescribed by your provider will help you have the flexibility and strength to maintain correct postures.  The correct posture  provides the best environment for your joints to work. All of your joints have less wear and tear when properly supported by a spine with good posture. This means you will experience a healthier, less painful body.  Correct posture must be practiced with all of your activities, especially prolonged sitting and standing. Correct posture is as important when doing repetitive low-stress activities (typing) as it is when doing a single heavy-load activity (lifting). PROPER SITTING POSTURE In order to minimize stress and discomfort on your spine, you must sit with correct posture. Sitting with good posture should be effortless for a healthy body. Returning to good posture is a gradual process. Many people can work toward this most comfortably by using various supports until they have the flexibility and strength to maintain this posture on their own. When sitting with proper posture, your ears will fall over your shoulders and your shoulders will fall over your hips.  You should use the back of the chair to support your upper back. Your lower back will be in a neutral position, just slightly arched. You may place a small pillow or folded towel at the base of your low back for  support.  When working at a desk, create an environment that supports good, upright posture. Without extra support, muscles fatigue and lead to excessive strain on joints and other tissues. Keep these recommendations in mind: CHAIR:  A chair should be able to slide under your desk when your back makes contact with the back of the chair. This allows you to work closely.  The chair's height should allow your eyes to be level with the upper part of your monitor and your hands to be slightly lower than your elbows. BODY POSITION  Your feet should make contact with the floor. If this is not possible, use a foot rest.  Keep your ears over your shoulders. This will reduce stress on your neck and lower back. INCORRECT SITTING POSTURES If  you are feeling tired and unable to assume a healthy sitting posture, do not slouch or slump. This puts excessive strain on your back tissues, causing more damage and pain. Healthier options include:  Using more support, like a lumbar pillow.  Switching tasks to something that requires you to be upright or walking.  Talking a brief walk.  Lying down to rest in a neutral-spine position. CORRECT STANDING POSTURES Proper standing posture should be assumed with all daily activities, even if they only take a few moments, like when brushing your teeth. As in sitting, your ears should fall over your shoulders and your shoulders should fall over your hips. You should keep a slight tension in your abdominal muscles to brace your spine. Your tailbone should point down to the ground, not behind your body, resulting in an over-extended swayback posture.  INCORRECT STANDING POSTURES Common incorrect standing postures include a forward head, locked knees, and an excessive swayback. WALKING Walk with an upright posture. Your ears, shoulders and hips should all line-up. CORRECT LIFTING TECHNIQUES DO :   Assume a wide stance. This will provide you more stability and the opportunity to get as close as possible to the object which you are lifting.  Tense your abdominals to brace your spine. Bend at the knees and hips. Keeping your back locked in a neutral-spine position, lift using your leg muscles. Lift with your legs, keeping your back straight.  Test the weight of unknown objects before attempting to lift them.  Try to keep your elbows locked down at your sides in order get the best strength from your shoulders when carrying an object.  Always ask for help when lifting heavy or awkward objects. INCORRECT LIFTING TECHNIQUES DO NOT:   Lock your knees when lifting, even if it is a small object.  Bend and twist. Pivot at your feet or move your feet when needing to change directions.  Assume that you  can safely pick up even a paperclip without proper posture.   This information is not intended to replace advice given to you by your health care provider. Make sure you discuss any questions you have with your health care provider.   Document Released: 06/07/2005 Document Revised: 10/22/2014 Document Reviewed: 09/19/2008 Elsevier Interactive Patient Education [employer].

## 2015-12-11 NOTE — Progress Notes (Signed)
Pre visit review using our clinic review tool, if applicable. No additional management support is needed unless otherwise documented below in the visit note. 

## 2015-12-11 NOTE — Assessment & Plan Note (Signed)
Upper back pain with no significant trauma most likely related to poor ergonomics while working. Treat conservatively with ice/heat, home exercise therapy, Duexis, and Pennsaid. Continue previously prescribed cyclobenzaprine. Follow-up for symptom worsening.

## 2015-12-11 NOTE — Assessment & Plan Note (Signed)
Snoring and periods of apnea described by family with concern for sleep apnea. Refer to sleep medicine for evaluation of sleep apnea. Encouraged weight loss through lifestyle management. Follow up pending referral.

## 2015-12-11 NOTE — Progress Notes (Signed)
Subjective:    Patient ID: Jennifer Waters, female    DOB: 03-Feb-1961, 55 y.o.   MRN: [id number]  Chief Complaint  Patient presents with  . Back Pain    has back pain when moving right arm, pain is in the upper part of her back, x1 week    HPI:  Jennifer Waters is a 55 y.o. female who  has a past medical history of Arthritis; Migraines; and Hypertension. and presents today For an acute office visit.  1.) Upper back pain - This is a new problem. Associated symptom of pain located in her upper back and exacerbated by moving her right arm has been going on for approximately two weeks. Denies trauma. Modifying factors include Tylenol and ibuprofen which helps for a couple of hours and then returns. 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 Radiates to her right shoulder. Right hand dominant. No numbness or tingling. Course of the symptoms has gotten worse over the 2 week period. Improved when not at work.   2.) Snoring - this is a new problem. Associated symptom of snoring and occasional apnea while sleeping have been reported by patient's family. Described as coughing/gasping for air on occasion. Does experience daytime sleepiness. Significant family history of sleep apnea. Patient requesting a sleep study.   No Known Allergies   Current Outpatient Prescriptions on File Prior to Visit  Medication Sig Dispense Refill  . amLODipine (NORVASC) 5 MG tablet Take 1 tablet (5 mg total) by mouth daily. 90 tablet 3  . atenolol (TENORMIN) 100 MG tablet Take 100 mg by mouth daily.    . cyclobenzaprine (FLEXERIL) 5 MG tablet Take 1 tablet (5 mg total) by mouth at bedtime as needed for muscle spasms. 30 tablet 5  . divalproex (DEPAKOTE) 500 MG DR tablet     . venlafaxine XR (EFFEXOR-XR) 75 MG 24 hr capsule      No current facility-administered medications on file prior to visit.    Past Medical History  Diagnosis Date  . Arthritis   . Migraines   . Hypertension     Review  of Systems  Constitutional: Negative for fever and chills.  Musculoskeletal: Positive for back pain.  Neurological: Negative for weakness and numbness.      Objective:    BP 104/74 mmHg  Pulse 92  Temp(Src) 98.1 F (36.7 C) (Oral)  Resp 16  Ht 5\' 9"  (1.753 m)  Wt 231 lb (104.781 kg)  BMI 34.10 kg/m2  SpO2 97% Nursing note and vital signs reviewed.  Physical Exam  Constitutional: She is oriented to person, place, and time. She appears well-developed and well-nourished. No distress.  Cardiovascular: Normal rate, regular rhythm, normal heart sounds and intact distal pulses.   Pulmonary/Chest: Effort normal and breath sounds normal.  Musculoskeletal:  Upper back/neck - no obvious deformity, discoloration, or edema noted. Palpable tenderness along spinous processes and paraspinal musculature. There does appear to be pectoralis major tightness and rhomboid/trapezius weakness. Range of motion is slightly restricted and lateral bending of the neck. Distal pulses and sensation are intact and appropriate.  Neurological: She is alert and oriented to person, place, and time.  Skin: Skin is warm and dry.  Psychiatric: She has a normal mood and affect. Her behavior is normal. Judgment and thought content normal.       Assessment & Plan:   Problem List Items Addressed This Visit      Respiratory   Allergic rhinitis   Relevant  Medications   fluticasone (FLONASE) 50 MCG/ACT nasal spray     Digestive   GERD (gastroesophageal reflux disease)   Relevant Medications   omeprazole (PRILOSEC) 20 MG capsule     Other   Upper back pain - Primary    Upper back pain with no significant trauma most likely related to poor ergonomics while working. Treat conservatively with ice/heat, home exercise therapy, Duexis, and Pennsaid. Continue previously prescribed cyclobenzaprine. Follow-up for symptom worsening.      Relevant Medications   Ibuprofen-Famotidine 800-26.6 MG TABS   Diclofenac Sodium  (PENNSAID) 2 % SOLN   Snoring    Snoring and periods of apnea described by family with concern for sleep apnea. Refer to sleep medicine for evaluation of sleep apnea. Encouraged weight loss through lifestyle management. Follow up pending referral.       Relevant Orders   Ambulatory referral to Neurology       I have changed Ms. Street's omeprazole. I am also having her start on fluticasone, Ibuprofen-Famotidine, and Diclofenac Sodium. Additionally, I am having her maintain her atenolol, amLODipine, divalproex, venlafaxine XR, and cyclobenzaprine.   Meds ordered this encounter  Medications  . omeprazole (PRILOSEC) 20 MG capsule    Sig: Take 1 capsule (20 mg total) by mouth daily.    Dispense:  90 capsule    Refill:  1    Order Specific Question:  Supervising Provider    Answer:  Pricilla Holm A [id number]  . fluticasone (FLONASE) 50 MCG/ACT nasal spray    Sig: Place 2 sprays into both nostrils daily.    Dispense:  16 g    Refill:  6    Order Specific Question:  Supervising Provider    Answer:  Pricilla Holm A [id number]  . Ibuprofen-Famotidine 800-26.6 MG TABS    Sig: Take 1 tablet by mouth 3 (three) times daily as needed.    Dispense:  90 tablet    Refill:  1    Order Specific Question:  Supervising Provider    Answer:  Pricilla Holm A [id number]  . Diclofenac Sodium (PENNSAID) 2 % SOLN    Sig: Place 1 application onto the skin 2 (two) times daily as needed.    Dispense:  112 g    Refill:  1    Order Specific Question:  Supervising Provider    Answer:  Pricilla Holm A [id number]     Follow-up: Return in about 1 month (around 01/10/2016), or if symptoms worsen or fail to improve.  Mauricio Po, FNP

## 2015-12-19 ENCOUNTER — Telehealth: Payer: Self-pay | Admitting: Neurology

## 2015-12-19 NOTE — Telephone Encounter (Signed)
Pt needs to talk to someone about how to help with the pain she is having please call [phone]

## 2015-12-19 NOTE — Telephone Encounter (Signed)
I spoke with patient and she is having back pain on the right side.  She just saw Mauricio Po, NP last week and his last note said to call back if not any better.  Instructed her to call him and let them know what is going on.  If he thinks she needs a neuro appt, she will call back to schedule.

## 2016-01-21 ENCOUNTER — Ambulatory Visit (INDEPENDENT_AMBULATORY_CARE_PROVIDER_SITE_OTHER): Payer: BLUE CROSS/BLUE SHIELD | Admitting: Neurology

## 2016-01-21 ENCOUNTER — Encounter: Payer: Self-pay | Admitting: Neurology

## 2016-01-21 VITALS — BP 130/88 | HR 76 | Resp 20 | Ht 69.0 in | Wt 221.0 lb

## 2016-01-21 DIAGNOSIS — R0683 Snoring: Secondary | ICD-10-CM

## 2016-01-21 DIAGNOSIS — R519 Headache, unspecified: Secondary | ICD-10-CM

## 2016-01-21 DIAGNOSIS — F17200 Nicotine dependence, unspecified, uncomplicated: Secondary | ICD-10-CM | POA: Diagnosis not present

## 2016-01-21 DIAGNOSIS — R51 Headache: Secondary | ICD-10-CM

## 2016-01-21 DIAGNOSIS — R5383 Other fatigue: Secondary | ICD-10-CM

## 2016-01-21 DIAGNOSIS — G4730 Sleep apnea, unspecified: Secondary | ICD-10-CM

## 2016-01-21 DIAGNOSIS — G4710 Hypersomnia, unspecified: Secondary | ICD-10-CM

## 2016-01-21 DIAGNOSIS — G479 Sleep disorder, unspecified: Secondary | ICD-10-CM

## 2016-01-21 NOTE — Progress Notes (Signed)
SLEEP MEDICINE CLINIC   Provider:  Larey Seat, M D  Referring Provider: [facility], FNP Primary Care Physician:  Mauricio Po, [place]  Chief Complaint  Patient presents with  . New Patient (Initial Visit)    snores, had sleep study over 10 years ago    HPI:  Jennifer Waters is a 55 y.o. female , seen here as a referral from [place], Maryanna Shape for a sleep consultation,  Chief complaint according to patient : " my daughter repots that I snore ! "   The patient has a general neurologist and Dr. Rochele Raring and Dr. Orlene Och, who follows her for migraine headaches.   Jennifer Waters explained to me that she is here basically because her family, mainly her daughter has described her as breathing very irregularly at night snoring loudly, and sometimes coughing and gasping for air. She does experience daytime sleepiness but she has not awoken herself from snoring or apnea. She reports that she feels she sleeps soundly.  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 She is talking to various people throughout the day and usually sips some coffee ( 6 cups or more )  throughout the day. She works from 6:30 AM to 3 PM on her on the phones but she states that it is sometimes hard to wind down at night she has tried some melatonin to help her fall asleep. She has worked this kind of early dayshift for the last year but in the past has worked night shift and late shifts. Altogether she has a history of shift work for well over a decade. The patient is taking Norvasc and Tenormin for blood pressure the Tenormin is also meant to help with migraines, she's taking Depakote and Effexor and has cyclobenzaprine available for muscle spasms as needed. She does not feel that Flexeril makes her sleepy so. She also takes supplemental magnesium, drinks tonic  water for quinine and has found some help with abdominal girdle spasms, which are unrelated to meal times. They seem to be triggered by bending over..    Sleep habits are as follows: The patient is single but her daughter is living with her, she stated that her bedroom is cool, quiet and dark. She likes to sleep on her side, she usually usually just one pillow. Some nights she is exhausted that she will go to bed at 5:00 and will sleep, his bedtime however is around 9 PM. She usually falls asleep rather promptly. Her right arm numbness and shoulder pain sometimes wake her, but abdominal cramps have not let to nocturnal arousals. She usually can sleep through the night except for bathroom breaks the frequency here depends on how much fluid she has taken in but usually she has one bathroom break each night -on a bad night 3. On Effexor she has dreamless sleep, when she forgets Effexor she has experienced " electric shock " headaches and vivid dreams.   Alarm usually goes off at 5; 13, and she usually has breakfast at home in a coffee before going to work. Since she has no commute and does need to address she usually has a very brief morning.  Sleep medical history and family sleep history:  "They snore " brother, sisters on CPAP.   Social history: single, living with her daughter. Caffeine 6-8 cups throughout the work day. Smokes  1 ppd / "stress therapy " , ETOH ; 2 a week/ or less.   Review of Systems: Out of a complete 14 system review, the patient complains of only the following symptoms, and all other reviewed systems are negative.  The Epworth Sleepiness Scale was endorsed at 8 points, the fatigue severity scale was endorsed at the highest possible level LXIII points Epworth score 8 , Fatigue severity score 63   , depression score 2/15    Social History   Social History  . Marital status: Divorced    Spouse name: N/A  . Number of children: 1  . Years of education: 37   Occupational  History  . Supervisor    Social History Main Topics  . Smoking status: Current Every Day Smoker    Packs/day: 0.50    Years: 20.00    Types: Cigarettes  . Smokeless tobacco: Never Used  . Alcohol use 3.0 oz/week    5 Glasses of wine per week  . Drug use: No  . Sexual activity: Yes    Birth control/ protection: Surgical   Other Topics Concern  . Not on file   Social History Narrative   Fun: Puzzles, cats   Denies any religious beliefs effecting health care. Lives with daughter in a one story home.  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  Education: some college.                                                               +    Family History  Problem Relation Age of Onset  . Arthritis Mother   . Ovarian cancer Mother   . Migraines Mother     Past Medical History:  Diagnosis Date  . Arthritis   . Hypertension   . Migraines     Past Surgical History:  Procedure Laterality Date  . ABDOMINAL HYSTERECTOMY      Current Outpatient Prescriptions  Medication Sig Dispense Refill  . amLODipine (NORVASC) 5 MG tablet Take 1 tablet (5 mg total) by mouth daily. 90 tablet 3  . atenolol (TENORMIN) 100 MG tablet Take 100 mg by mouth daily.    . cyclobenzaprine (FLEXERIL) 5 MG tablet Take 1 tablet (5 mg total) by mouth at bedtime as needed for muscle spasms. 30 tablet 5  . Diclofenac Sodium (PENNSAID) 2 % SOLN Place 1 application onto the skin 2 (two) times daily as needed. 112 g 1  . divalproex (DEPAKOTE) 500 MG DR tablet     . fluticasone (FLONASE) 50 MCG/ACT nasal spray Place 2 sprays into both nostrils daily. 16 g 6  . Ibuprofen-Famotidine 800-26.6 MG TABS Take 1 tablet by mouth 3 (three) times daily as needed. 90 tablet 1  . omeprazole (PRILOSEC) 20 MG capsule Take 1 capsule (20 mg total) by mouth daily. 90 capsule 1  . venlafaxine XR (EFFEXOR-XR) 75 MG 24 hr capsule      No current facility-administered medications for this visit.     Allergies as of  01/21/2016  . (No Known Allergies)    Vitals: BP 130/88   Pulse 76   Resp 20   Ht 5\' 9"  (1.753 m)   Wt 221 lb (100.2 kg)   BMI 32.64 kg/m  Last Weight:  Wt Readings from Last 1  Encounters:  01/21/16 221 lb (100.2 kg)   [id number] mass index is 32.64 kg/m.     Last Height:   Ht Readings from Last 1 Encounters:  01/21/16 5\' 9"  (1.753 m)    Physical exam:  General: The patient is awake, alert and appears not in acute distress. The patient is well groomed. Head: Normocephalic, atraumatic. Neck is supple. Mallampati 4,  neck circumference; 16.5 . Nasal airflow patent , TMJ click is not  evident . Retrognathia is seen.  Cardiovascular:  Regular rate and rhythm , without  murmurs or carotid bruit, and without distended neck veins. Respiratory: Lungs are clear to auscultation. Skin:  Without evidence of edema, or rash Trunk: BMI is elevated  The patient's posture is erect  Neurologic exam : The patient is awake and alert, oriented to place and time.   Memory subjective described as intact.  Attention span & concentration ability appears normal.  Speech is fluent,  without dysarthria, mild dysphonia , no aphasia.  Mood and affect are appropriate.  Cranial nerves: Pupils are equal and briskly reactive to light. Funduscopic exam without   evidence of pallor or edema. Extraocular movements  in vertical and horizontal planes intact and without nystagmus. Visual fields by finger perimetry are intact. Hearing to finger rub intact.   Facial sensation intact to fine touch.  Facial motor strength is symmetric and tongue and uvula move midline. Shoulder shrug was symmetrical.   Motor exam:  Normal tone, muscle bulk and symmetric strength in all extremities.  Sensory:  Fine touch, pinprick and vibration,  Proprioception tested in the upper extremities was normal.  Coordination: Finger-to-nose maneuver  normal without evidence of ataxia, dysmetria or tremor.  Gait and station: Patient  walks without assistive device and is able unassisted to climb up to the exam table. Strength within normal limits.  Stance is stable and normal.  Turns with  3 Steps.  Deep tendon reflexes: in the  upper and lower extremities are symmetric and intact. Babinski maneuver response is downgoing.   Dear nurse practitioner Elna Breslow, I very much appreciate your referral of Jennifer Waters, whom I had the pleasure of seeing today on 01/21/2016. As you know she has several risk factors for sleep apnea and her family has noted that she snores. There is also a strong family history of siblings with sleep apnea, currently treated with CPAP. In addition years ago she was even told during a hospital visit that she fell asleep snoring very loudly and her daughter seems to be concerned about apneas. She is aware that weight loss may help snoring and apnea but that there are other risk factors present as well with include retrognathia, high-grade Mallampati and her smoking. In addition she has sometimes migrainous headaches that she wakes up from sleep with. These migraines used to wake her up but now they're just present sometimes when she wakes up no longer the cause of her arousals. She has been successfully treated with Dr. Vira Agar. I would like to add carbon dioxide measurements-capnography to her sleep study to make sure that she has a normal gas exchange this is also necessary because she endorses such a high degree of fatigue rather than sleepiness.    The patient was advised of the nature of the diagnosed sleep disorder , the treatment options and risks for general a health and wellness arising from not treating the condition.  I spent more than 45 minutes of face to face time with the patient. Greater than 50%  of time was spent in counseling and coordination of care. We have discussed the diagnosis and differential and I answered the patient's questions.     Assessment:  After physical and neurologic  examination, review of laboratory studies,  Personal review of imaging studies, reports of other /same  Imaging studies ,  Results of polysomnography/ neurophysiology testing and pre-existing records as far as provided in visit., my assessment is   Nicotine use, caffeine use, extreme daytime fatigue, witnessed snoring and apneaCarmen Edvin Waters, M.D. overweight. Retrognathia is not a neck circumference high-grade Mallampati.  The patient reports joint pain shoulder elbow and wrist and hands and has parents slides family members that were severely crippled by arthritis. She may want to introduce herself to Dr. Amil Amen, rheumatology. His office is very close to Dr. Vira Agar.  Abdominal spasms have been treated by Dr. Rochele Raring. I learn that the patient has not yet had a colonoscopy and would like a female GI doctor. I mentioned Dr. Collene Mares to her, I think Dr. Elicia Lamp has retired.   SPLIT with capnography, RV after sleep study.       Asencion Partridge Evette Diclemente MD  01/21/2016   CC: [facility], [place] [address] [place], [place] [address]

## 2016-02-06 ENCOUNTER — Encounter: Payer: Self-pay | Admitting: Neurology

## 2016-02-06 ENCOUNTER — Ambulatory Visit (INDEPENDENT_AMBULATORY_CARE_PROVIDER_SITE_OTHER): Payer: BLUE CROSS/BLUE SHIELD | Admitting: Neurology

## 2016-02-06 VITALS — BP 150/90 | HR 120 | Ht 69.0 in | Wt 226.4 lb

## 2016-02-06 DIAGNOSIS — F17200 Nicotine dependence, unspecified, uncomplicated: Secondary | ICD-10-CM

## 2016-02-06 DIAGNOSIS — M542 Cervicalgia: Secondary | ICD-10-CM

## 2016-02-06 DIAGNOSIS — R252 Cramp and spasm: Secondary | ICD-10-CM

## 2016-02-06 MED ORDER — CYCLOBENZAPRINE HCL 5 MG PO TABS: 5.0000 mg | ORAL_TABLET | Freq: Every evening | ORAL | 3 refills | Status: DC | PRN

## 2016-02-06 NOTE — Progress Notes (Signed)
Follow-up Visit   Date: 02/06/16    Jennifer Waters MRN: [id number] DOB: 1960-12-04   Interim History: Jennifer Waters is a 55 y.o. right-handed African Ame[place] female with migraines, hypertension, tobacco user, and arthritis returning to the clinic for follow-up of muscle cramps.  The patient was accompanied to the clinic by self.  History of present illness: Starting around in her 11s, she began having sudden and severe muscle cramps involving the abdomen and feet.  There involve her abdomen most commonly, but also affects her hands and feet and have been worsening over the years.   Her muscle spasms are painful and prevent her from staying active.  She tries to massage and rub the muscles to relax, but can last 5 minutes.  Activity usually triggers the spasms.  The frequency varies and can occur once every few weeks to several times per day.  She stays well-hydrated, but admits to drinking four large cups of coffee daily.  She denies any stiffness, muscle spasm, dysarthria, dysphagia, or dark-colored urine.  There is no family history of neuromuscular disorders.  In early 2017, she began experiencing tingling sensation over the right from her fingertips to her elbow.  Symptoms are intermittent throughout the day.  Denies any weakness or neck pain.   She sees Dr. Catalina Gravel for her migraines and takes depakote 540m BID and venlafaxine 753mdaily.  UPDATE 02/06/2016:  Since starting flexeril 79m7mnd tonic water, her muscle spasm have markedly improved, now only occurring if she skips a muscle relaxer.  She complains of upper neck and shoulder pain, but attributes this to increased stress.  She complains of itching over the left hand and still has occasional paresthesias in the right hand.    Medications:  Current Outpatient Prescriptions on File Prior to Visit  Medication Sig Dispense Refill  . amLODipine (NORVASC) 5 MG tablet Take 1 tablet (5 mg total) by mouth daily. 90 tablet 3    . atenolol (TENORMIN) 100 MG tablet Take 100 mg by mouth daily.    . cyclobenzaprine (FLEXERIL) 5 MG tablet Take 1 tablet (5 mg total) by mouth at bedtime as needed for muscle spasms. 30 tablet 5  . Diclofenac Sodium (PENNSAID) 2 % SOLN Place 1 application onto the skin 2 (two) times daily as needed. 112 g 1  . divalproex (DEPAKOTE) 500 MG DR tablet     . fluticasone (FLONASE) 50 MCG/ACT nasal spray Place 2 sprays into both nostrils daily. 16 g 6  . Ibuprofen-Famotidine 800-26.6 MG TABS Take 1 tablet by mouth 3 (three) times daily as needed. 90 tablet 1  . omeprazole (PRILOSEC) 20 MG capsule Take 1 capsule (20 mg total) by mouth daily. 90 capsule 1  . venlafaxine XR (EFFEXOR-XR) 75 MG 24 hr capsule      No current facility-administered medications on file prior to visit.     Allergies: No Known Allergies  Review of Systems:  CONSTITUTIONAL: No fevers, chills, night sweats, or weight loss.  EYES: No visual changes or eye pain ENT: No hearing changes.  No history of nose bleeds.   RESPIRATORY: No cough, wheezing and shortness of breath.   CARDIOVASCULAR: Negative for chest pain, and palpitations.   GI: Negative for abdominal discomfort, blood in stools or black stools.  No recent change in bowel habits.   GU:  No history of incontinence.   MUSCLOSKELETAL: No history of joint pain or swelling.  No myalgias.   SKIN: Negative for lesions, rash, and  itching.   ENDOCRINE: Negative for cold or heat intolerance, polydipsia or goiter.   PSYCH:  No depression or anxiety symptoms.   NEURO: As Above.   Vital Signs:  BP (!) 150/90   Pulse (!) 120   Ht 5' 9" (1.753 m)   Wt 226 lb 6 oz (102.7 kg)   SpO2 96%   BMI 33.43 kg/m   Neurological Exam: MENTAL STATUS including orientation to time, place, person, recent and remote memory, attention span and concentration, language, and fund of knowledge is normal.  Speech is not dysarthric.  CRANIAL NERVES:Pupils equal round and reactive to light.   Normal conjugate, extra-ocular eye movements in all directions of gaze.  No ptosis..  Face is symmetric. Palate elevates symmetrically.  Tongue is midline.  MOTOR:  Motor strength is 5/5 in all extremities.  Reduced neck ROM in rotation to right. No atrophy, fasciculations or abnormal movements.  No pronator drift.  Tone is normal.    COORDINATION/GAIT:   Gait narrow based and stable.   Data: NCS/EMG of the right upper extremity 08/05/2015: 1. Chronic C6 radiculopathy affecting the right upper extremity; mild in degree electrically. 2. There is no evidence of myopathy or carpal tunnel syndrome affecting the right upper extremity.  Labs 08/01/2015:  ESR 3, TSH 0.93, CK 213, aldolase 6.2, Vitamin E 2.3, selenium 109, vitamin B12 862  IMPRESSION/PLAN: 1.  Muscle spasms, significantly improved with tonic water and muscle relaxants  - Continue flexeril 5mg at bedtime  - Continue tonic water  - She is able to taper magesium supplements to see which strategies is helping her the most  2.  Cervicalgia and bilateral hand paresthesias, mild C6 radiculopathy on EMG with overlapping stress/tension causing muscle strain  - Start neck PT  3.  Tobacco use  - Encouraged her to stop smoking  Return to clinic as needed  The duration of this appointment visit was 25 minutes of face-to-face time with the patient.  Greater than 50% of this time was spent in counseling, explanation of diagnosis, planning of further management, and coordination of care.   Thank you for allowing me to participate in patient's care.  If I can answer any additional questions, I would be pleased to do so.    Sincerely,    Donika K. Patel, DO   

## 2016-02-06 NOTE — Patient Instructions (Addendum)
1.  Continue flexeril 5mg  at bedtime 2.  Start neck physiotherapy 3.  You can try cutting back on magnesium to see if you really need to continue taking it 4.  Encouraged to stop smoking   Return to clinic as needed

## 2016-03-09 ENCOUNTER — Telehealth: Payer: Self-pay | Admitting: Neurology

## 2016-03-09 NOTE — Telephone Encounter (Signed)
Put it I, please. CD

## 2016-03-09 NOTE — Telephone Encounter (Signed)
[employer] denied Split Sleep Study however did authorize a HST.  Could I get an order for HST

## 2016-03-15 ENCOUNTER — Ambulatory Visit (INDEPENDENT_AMBULATORY_CARE_PROVIDER_SITE_OTHER): Payer: BLUE CROSS/BLUE SHIELD | Admitting: Family

## 2016-03-15 ENCOUNTER — Encounter: Payer: Self-pay | Admitting: Family

## 2016-03-15 DIAGNOSIS — R05 Cough: Secondary | ICD-10-CM | POA: Diagnosis not present

## 2016-03-15 DIAGNOSIS — R059 Cough, unspecified: Secondary | ICD-10-CM

## 2016-03-15 MED ORDER — ALBUTEROL SULFATE HFA 108 (90 BASE) MCG/ACT IN AERS: 2.0000 | INHALATION_SPRAY | Freq: Four times a day (QID) | RESPIRATORY_TRACT | 0 refills | Status: DC | PRN

## 2016-03-15 MED ORDER — AZITHROMYCIN 250 MG PO TABS: ORAL_TABLET | ORAL | 0 refills | Status: DC

## 2016-03-15 MED ORDER — PREDNISONE 20 MG PO TABS: 20.0000 mg | ORAL_TABLET | Freq: Every day | ORAL | 0 refills | Status: DC

## 2016-03-15 NOTE — Assessment & Plan Note (Signed)
Symptoms and exam concerning for bronchitis. Treat conservatively with over-the-counter medications as needed for symptom relief and supportive care. Start prednisone and albuterol as needed for wheezing. If symptoms worsen or do not improve start azithromycin. Instructions for watchful waiting provided. Follow-up if symptoms worsen or do not improve.

## 2016-03-15 NOTE — Patient Instructions (Signed)
Thank you for choosing [facility].  SUMMARY AND INSTRUCTIONS:  Medication:  Your prescription(s) have been submitted to your pharmacy or been printed and provided for you. Please take as directed and contact our office if you believe you are having problem(s) with the medication(s) or have any questions.  Follow up:  If your symptoms worsen or fail to improve, please contact our office for further instruction, or in case of emergency go directly to the emergency room at the closest medical facility.    General Recommendations:    Please drink plenty of fluids.  Get plenty of rest   Sleep in humidified air  Use saline nasal sprays  Netti pot   OTC Medications:  Decongestants - helps relieve congestion   Flonase (generic fluticasone) or Nasacort (generic triamcinolone) - please make sure to use the "cross-over" technique at a 45 degree angle towards the opposite eye as opposed to straight up the nasal passageway.   Sudafed (generic pseudoephedrine - Note this is the one that is available behind the pharmacy counter); Products with phenylephrine (-PE) may also be used but is often not as effective as pseudoephedrine.   If you have HIGH BLOOD PRESSURE - Coricidin HBP; AVOID any product that is -D as this contains pseudoephedrine which may increase your blood pressure.  Afrin (oxymetazoline) every 6-8 hours for up to 3 days.   Allergies - helps relieve runny nose, itchy eyes and sneezing   Claritin (generic loratidine), Allegra (fexofenidine), or Zyrtec (generic cyrterizine) for runny nose. These medications should not cause drowsiness.  Note - Benadryl (generic diphenhydramine) may be used however may cause drowsiness  Cough -   Delsym or Robitussin (generic dextromethorphan)  Expectorants - helps loosen mucus to ease removal   Mucinex (generic guaifenesin) as directed on the package.  Headaches / General Aches   Tylenol (generic acetaminophen) - DO NOT  EXCEED 3 grams (3,000 mg) in a 24 hour time period  Advil/Motrin (generic ibuprofen)   Sore Throat -   Salt water gargle   Chloraseptic (generic benzocaine) spray or lozenges / Sucrets (generic dyclonine)     

## 2016-03-15 NOTE — Progress Notes (Signed)
Subjective:    Patient ID: Jennifer Waters, female    DOB: [birth year], 55 y.o.   MRN: [id number]  Chief Complaint  Patient presents with  . Cough    since saturday, cough, weakness, headache, SOB, and wheezing    HPI:  Jennifer Waters is a 55 y.o. female who  has a past medical history of Arthritis; Hypertension; and Migraines. and presents today for an acute office vist.   This is a new problem. Associated symptoms of cough, weakness, headache, shortness of breath and wheeizng has been going on for about 3 days. Notes a recent visitor to her home was sick. No fevers. Modifying factors include Mucinex which has helped a little. Course of the symptoms have remained about the same since her initial onset. Has decreased appetite but is able to eat. No recent antibiotic use.  No Known Allergies    Outpatient Medications Prior to Visit  Medication Sig Dispense Refill  . amLODipine (NORVASC) 5 MG tablet Take 1 tablet (5 mg total) by mouth daily. 90 tablet 3  . atenolol (TENORMIN) 100 MG tablet Take 100 mg by mouth daily.    . cyclobenzaprine (FLEXERIL) 5 MG tablet Take 1 tablet (5 mg total) by mouth at bedtime as needed for muscle spasms. 90 tablet 3  . Diclofenac Sodium (PENNSAID) 2 % SOLN Place 1 application onto the skin 2 (two) times daily as needed. 112 g 1  . divalproex (DEPAKOTE) 500 MG DR tablet     . fluticasone (FLONASE) 50 MCG/ACT nasal spray Place 2 sprays into both nostrils daily. 16 g 6  . Ibuprofen-Famotidine 800-26.6 MG TABS Take 1 tablet by mouth 3 (three) times daily as needed. 90 tablet 1  . omeprazole (PRILOSEC) 20 MG capsule Take 1 capsule (20 mg total) by mouth daily. 90 capsule 1  . venlafaxine XR (EFFEXOR-XR) 75 MG 24 hr capsule      No facility-administered medications prior to visit.       Past Surgical History:  Procedure Laterality Date  . ABDOMINAL HYSTERECTOMY        Past Medical History:  Diagnosis Date  . Arthritis   . Hypertension   .  Migraines       Review of Systems  Constitutional: Negative for chills and fever.  HENT: Positive for congestion. Negative for ear pain, sinus pressure and sore throat.   Respiratory: Positive for cough and wheezing. Negative for shortness of breath.   Neurological: Positive for weakness and headaches.      Objective:    BP (!) 148/94 (BP Location: Left Arm, Patient Position: Sitting, Cuff Size: Large)   Pulse (!) 56   Temp 98.2 F (36.8 C) (Oral)   Resp 16   Ht 5\' 9"  (1.753 m)   Wt 219 lb (99.3 kg)   SpO2 92%   BMI 32.34 kg/m  Nursing note and vital signs reviewed.  Physical Exam  Constitutional: She is oriented to person, place, and time. She appears well-developed and well-nourished. No distress.  HENT:  Right Ear: Hearing, tympanic membrane, external ear and ear canal normal.  Left Ear: Hearing, tympanic membrane, external ear and ear canal normal.  Nose: Nose normal. Right sinus exhibits no maxillary sinus tenderness and no frontal sinus tenderness. Left sinus exhibits no maxillary sinus tenderness and no frontal sinus tenderness.  Mouth/Throat: Uvula is midline, oropharynx is clear and moist and mucous membranes are normal.  Cardiovascular: Normal rate, regular rhythm, normal heart sounds and intact distal pulses.  Pulmonary/Chest: Effort normal and breath sounds normal.  Neurological: She is alert and oriented to person, place, and time.  Skin: Skin is warm and dry.  Psychiatric: She has a normal mood and affect. Her behavior is normal. Judgment and thought content normal.       Assessment & Plan:   Problem List Items Addressed This Visit      Other   Cough    Symptoms and exam concerning for bronchitis. Treat conservatively with over-the-counter medications as needed for symptom relief and supportive care. Start prednisone and albuterol as needed for wheezing. If symptoms worsen or do not improve start azithromycin. Instructions for watchful waiting provided.  Follow-up if symptoms worsen or do not improve.      Relevant Medications   albuterol (PROVENTIL HFA;VENTOLIN HFA) 108 (90 Base) MCG/ACT inhaler   predniSONE (DELTASONE) 20 MG tablet   azithromycin (ZITHROMAX) 250 MG tablet    Other Visit Diagnoses   None.      I am having [name] start on albuterol, predniSONE, and azithromycin. I am also having her maintain her atenolol, amLODipine, divalproex, venlafaxine XR, omeprazole, fluticasone, Ibuprofen-Famotidine, Diclofenac Sodium, and cyclobenzaprine.   Meds ordered this encounter  Medications  . albuterol (PROVENTIL HFA;VENTOLIN HFA) 108 (90 Base) MCG/ACT inhaler    Sig: Inhale 2 puffs into the lungs every 6 (six) hours as needed for wheezing or shortness of breath.    Dispense:  1 Inhaler    Refill:  0    Order Specific Question:   Supervising Provider    Answer:   Pricilla Holm A [id number]  . predniSONE (DELTASONE) 20 MG tablet    Sig: Take 1 tablet (20 mg total) by mouth daily with breakfast.    Dispense:  10 tablet    Refill:  0    Order Specific Question:   Supervising Provider    Answer:   Pricilla Holm A [id number]  . azithromycin (ZITHROMAX) 250 MG tablet    Sig: Take 2 tablets by mouth for 1 day and then 1 tablet daily for 4 days.    Dispense:  6 tablet    Refill:  0    Order Specific Question:   Supervising Provider    Answer:   Pricilla Holm A [id number]     Follow-up: Return if symptoms worsen or fail to improve.  Mauricio Po, FNP

## 2016-03-16 ENCOUNTER — Encounter: Payer: BLUE CROSS/BLUE SHIELD | Admitting: Neurology

## 2016-03-17 ENCOUNTER — Encounter (HOSPITAL_COMMUNITY): Payer: Self-pay | Admitting: *Deleted

## 2016-03-17 ENCOUNTER — Emergency Department (HOSPITAL_COMMUNITY)
Admission: EM | Admit: 2016-03-17 | Discharge: 2016-03-17 | Disposition: A | Discharge status: Home / Self Care | Payer: BLUE CROSS/BLUE SHIELD | Attending: Emergency Medicine | Admitting: Emergency Medicine

## 2016-03-17 DIAGNOSIS — M25511 Pain in right shoulder: Secondary | ICD-10-CM | POA: Diagnosis not present

## 2016-03-17 DIAGNOSIS — I10 Essential (primary) hypertension: Secondary | ICD-10-CM | POA: Insufficient documentation

## 2016-03-17 DIAGNOSIS — Z791 Long term (current) use of non-steroidal anti-inflammatories (NSAID): Secondary | ICD-10-CM | POA: Diagnosis not present

## 2016-03-17 DIAGNOSIS — R200 Anesthesia of skin: Secondary | ICD-10-CM | POA: Insufficient documentation

## 2016-03-17 DIAGNOSIS — R51 Headache: Secondary | ICD-10-CM | POA: Insufficient documentation

## 2016-03-17 DIAGNOSIS — F17210 Nicotine dependence, cigarettes, uncomplicated: Secondary | ICD-10-CM | POA: Diagnosis not present

## 2016-03-17 DIAGNOSIS — R519 Headache, unspecified: Secondary | ICD-10-CM

## 2016-03-17 DIAGNOSIS — Z79899 Other long term (current) drug therapy: Secondary | ICD-10-CM | POA: Diagnosis not present

## 2016-03-17 MED ORDER — DIPHENHYDRAMINE HCL 50 MG/ML IJ SOLN
12.5000 mg | Freq: Once | INTRAMUSCULAR | Status: AC
  Administered 2016-03-17: 12.5 mg via INTRAVENOUS
  Filled 2016-03-17: qty 1

## 2016-03-17 MED ORDER — DEXAMETHASONE SODIUM PHOSPHATE 10 MG/ML IJ SOLN
10.0000 mg | Freq: Once | INTRAMUSCULAR | Status: AC
  Administered 2016-03-17: 10 mg via INTRAVENOUS
  Filled 2016-03-17: qty 1

## 2016-03-17 MED ORDER — SODIUM CHLORIDE 0.9 % IV BOLUS (SEPSIS)
500.0000 mL | Freq: Once | INTRAVENOUS | Status: AC
  Administered 2016-03-17: 500 mL via INTRAVENOUS

## 2016-03-17 MED ORDER — PROMETHAZINE HCL 25 MG/ML IJ SOLN
12.5000 mg | Freq: Once | INTRAMUSCULAR | Status: AC
  Administered 2016-03-17: 12.5 mg via INTRAVENOUS
  Filled 2016-03-17: qty 1

## 2016-03-17 NOTE — ED Provider Notes (Signed)
[facility] DEPT Provider Note   CSN: [id number] Arrival date & time: 03/17/16  0406     History   Chief Complaint Chief Complaint  Patient presents with  . Migraine    HPI Jennifer Waters is a 55 y.o. female.  Jennifer Waters is a 55 y.o. female with h/o migraine headaches, HTN, and arthritis presents to ED with complaint of migraine headache. Pt reports a long history of migraine headaches since her 20's, last severe headache was approximately 6-7 years ago. When headaches are severe she typically has to come to the ED for tx. She is followed by a headache specialist - Conrad Armstrong. She reports she was trying an at home sleep apnea test last night; she awoke a 1:30am with severe headache. She took 800mg  ibuprofen and paced/walked around, headache subsided and she tried to go back to sleep; however, she awoke again at 3am with continued headache. Headache is in frontal region, described as a throbbing sensation. She has associated nausea and photophobia. She denies fever, trouble swallowing, neck pain, visual changes, chest pain, difficulty breathing, abdominal pain, vomiting, dizziness, lightheadedness, facial droop, slurred speech, weakness, syncope, or head trauma. She reports numbness in her right arm secondary to shoulder pain; however, present prior to onset of headache and chronic - she is being followed by her PCP and is undergoing physical therapy. She states this headache is the same as her typical severe migraine headaches. States her severe migraine headaches have woken her up in the past as well. She was recently started on steroids and an albuterol inhaler for a URI. No personal hx or family hx of SAH.       Past Medical History:  Diagnosis Date  . Arthritis   . Hypertension   . Migraines     Patient Active Problem List   Diagnosis Date Noted  . Cough 03/15/2016  . Upper back pain 12/11/2015  . Snoring 12/11/2015  . GERD (gastroesophageal reflux disease)  12/11/2015  . Laryngitis 05/13/2015  . Allergic rhinitis 05/01/2015  . Osteoarthritis 10/17/2014  . Essential hypertension 10/17/2014    Past Surgical History:  Procedure Laterality Date  . ABDOMINAL HYSTERECTOMY      OB History    No data available       Home Medications    Prior to Admission medications   Medication Sig Start Date End Date Taking? Authorizing Provider  albuterol (PROVENTIL HFA;VENTOLIN HFA) 108 (90 Base) MCG/ACT inhaler Inhale 2 puffs into the lungs every 6 (six) hours as needed for wheezing or shortness of breath. 03/15/16  Yes Golden Circle, FNP  atenolol (TENORMIN) 100 MG tablet Take 100 mg by mouth daily.   Yes Historical Provider, MD  cyclobenzaprine (FLEXERIL) 5 MG tablet Take 1 tablet (5 mg total) by mouth at bedtime as needed for muscle spasms. 02/06/16  Yes Donika K Patel, DO  divalproex (DEPAKOTE) 500 MG DR tablet Take 500 mg by mouth 2 (two) times daily.  05/06/15  Yes Historical Provider, MD  fluticasone (FLONASE) 50 MCG/ACT nasal spray Place 2 sprays into both nostrils daily. 12/11/15  Yes Golden Circle, FNP  Ibuprofen-Famotidine 800-26.6 MG TABS Take 1 tablet by mouth 3 (three) times daily as needed. 12/11/15  Yes Golden Circle, FNP  omeprazole (PRILOSEC) 20 MG capsule Take 1 capsule (20 mg total) by mouth daily. 12/11/15  Yes Golden Circle, FNP  predniSONE (DELTASONE) 20 MG tablet Take 1 tablet (20 mg total) by mouth daily with breakfast. 03/15/16  Yes Golden Circle, FNP  venlafaxine XR (EFFEXOR-XR) 75 MG 24 hr capsule Take 75 mg by mouth 2 (two) times daily.  05/06/15  Yes Historical Provider, MD  amLODipine (NORVASC) 5 MG tablet Take 1 tablet (5 mg total) by mouth daily. Patient not taking: Reported on 03/17/2016 05/01/15   Golden Circle, FNP  azithromycin (ZITHROMAX) 250 MG tablet Take 2 tablets by mouth for 1 day and then 1 tablet daily for 4 days. Patient not taking: Reported on 03/17/2016 03/15/16   Golden Circle, FNP  Diclofenac  Sodium (PENNSAID) 2 % SOLN Place 1 application onto the skin 2 (two) times daily as needed. Patient not taking: Reported on 03/17/2016 12/11/15   Golden Circle, FNP    Family History Family History  Problem Relation Age of Onset  . Arthritis Mother   . Ovarian cancer Mother   . Migraines Mother     Social History Social History  Substance Use Topics  . Smoking status: Current Every Day Smoker    Packs/day: 0.50    Years: 20.00    Types: Cigarettes  . Smokeless tobacco: Never Used  . Alcohol use 3.0 oz/week    5 Glasses of wine per week     Allergies   Review of patient's allergies indicates no known allergies.   Review of Systems Review of Systems  Constitutional: Negative for fever.  HENT: Negative for trouble swallowing.   Eyes: Negative for visual disturbance.  Respiratory: Positive for cough ( recent dx of URI). Negative for shortness of breath.   Cardiovascular: Negative for chest pain.  Gastrointestinal: Positive for nausea. Negative for abdominal pain, blood in stool, constipation, diarrhea and vomiting.  Genitourinary: Negative for dysuria and hematuria.  Musculoskeletal: Positive for arthralgias ( right shoulder). Negative for neck pain.  Skin: Negative for rash.  Neurological: Positive for numbness ( in right UE, chronic). Negative for dizziness, syncope, facial asymmetry, speech difficulty, weakness, light-headedness and headaches.     Physical Exam Updated Vital Signs BP 155/71 (BP Location: Left Arm)   Pulse 72   Temp 98 F (36.7 C) (Oral)   Resp 18   Ht 5\' 9"  (1.753 m)   Wt 99.3 kg   SpO2 96%   BMI 32.34 kg/m   Physical Exam  Constitutional: She appears well-developed and well-nourished. No distress.  HENT:  Head: Normocephalic and atraumatic.  Mouth/Throat: Oropharynx is clear and moist. No oropharyngeal exudate.  Eyes: Conjunctivae and EOM are normal. Pupils are equal, round, and reactive to light. Right eye exhibits no discharge. Left  eye exhibits no discharge. Scleral icterus is present.  Neck: Normal range of motion and phonation normal. Neck supple. No neck rigidity. Normal range of motion present.  Cardiovascular: Normal rate, regular rhythm, normal heart sounds and intact distal pulses.   No murmur heard. Pulmonary/Chest: Effort normal and breath sounds normal. No stridor. No respiratory distress. She has no wheezes. She has no rales.  Abdominal: Soft. Bowel sounds are normal. She exhibits no distension. There is no tenderness. There is no rigidity, no rebound, no guarding and no CVA tenderness.  Musculoskeletal: Normal range of motion.  Lymphadenopathy:    She has no cervical adenopathy.  Neurological: She is alert. She is not disoriented. Coordination and gait normal. GCS eye subscore is 4. GCS verbal subscore is 5. GCS motor subscore is 6.  Mental Status:  Alert, thought content appropriate, able to give a coherent history. Speech fluent without evidence of aphasia. Able to follow 2 step  commands without difficulty.  Cranial Nerves:  II:  Peripheral visual fields grossly normal, pupils equal, round, reactive to light III,IV, VI: ptosis not present, extra-ocular motions intact bilaterally  V,VII: smile symmetric, facial light touch sensation equal VIII: hearing grossly normal to voice  X: uvula elevates symmetrically  XI: bilateral shoulder shrug symmetric and strong XII: midline tongue extension without fassiculations Motor:  Normal tone. 5/5 in upper and lower extremities bilaterally including strong and equal grip strength and dorsiflexion/plantar flexion Sensory: light touch normal in all extremities. Cerebellar: normal finger-to-nose with bilateral upper extremities Gait: normal gait and balance CV: distal pulses palpable throughout   Skin: Skin is warm and dry. She is not diaphoretic.  Psychiatric: She has a normal mood and affect. Her behavior is normal.     ED Treatments / Results  Labs (all labs  ordered are listed, but only abnormal results are displayed) Labs Reviewed - No data to display  EKG  EKG Interpretation None       Radiology No results found.  Procedures Procedures (including critical care time)  Medications Ordered in ED Medications  sodium chloride 0.9 % bolus 500 mL (0 mLs Intravenous Stopped 03/17/16 1046)  diphenhydrAMINE (BENADRYL) injection 12.5 mg (12.5 mg Intravenous Given 03/17/16 0819)  promethazine (PHENERGAN) injection 12.5 mg (12.5 mg Intravenous Given 03/17/16 0819)  dexamethasone (DECADRON) injection 10 mg (10 mg Intravenous Given 03/17/16 1010)     Initial Impression / Assessment and Plan / ED Course  I have reviewed the triage vital signs and the nursing notes.  Pertinent labs & imaging results that were available during my care of the patient were reviewed by me and considered in my medical decision making (see chart for details).  Clinical Course    Patient presents to ED with complaint of headache. Patient is afebrile and non-toxic appearing in NAD. Vital signs remarkable for elevated BP. Pt HA treated and improved while in ED.  Presentation is like pts typical HA.  Discussed possibility of head CT, patient at this time declines, states "this is exactly like my typical migraines." Low suspicion for [facility], ICH, Meningitis, or temporal arteritis. Pt is afebrile with no focal neuro deficits, nuchal rigidity, or change in vision. Pt is to follow up with headache specialist to discuss further management. Return precautions given. Pt verbalizes understanding and is agreeable with plan to dc.      Final Clinical Impressions(s) / ED Diagnoses   Final diagnoses:  Bad headache    New Prescriptions Discharge Medication List as of 03/17/2016 10:12 AM       Roxanna Mew, PA-C 03/17/16 1101    Leo Grosser, MD 03/17/16 [phone]

## 2016-03-17 NOTE — ED Triage Notes (Signed)
Pt states that she was doing a sleep study at home and woke up around 0130 with a severe headache; pt states that she has not had a headache that sever in approx 5 yrs; pt states that she walked around and paced; took some medication and got the pain under control; pt states that she laid back down and the pain returned; pt states that she is unsure why she began having the headache and reports no changes in her environment; pt states that she did start some new medication 2 days ago; denies visual changes; pt c/o light sensitivity

## 2016-03-17 NOTE — Discharge Instructions (Signed)
Read the information below.  Your headache improved with treatment.  I encourage you to follow up with your headache specialist this week for re-evaluation. Please call for appointment.  You may return to the Emergency Department at any time for worsening condition or any new symptoms that concern you. Return to ED if you develop facial droop, slurred speech, numbness, weakness, changes in vision, or any new/different sxs from typical migraine.

## 2016-04-13 ENCOUNTER — Other Ambulatory Visit: Payer: Self-pay | Admitting: Family

## 2016-04-13 DIAGNOSIS — M549 Dorsalgia, unspecified: Secondary | ICD-10-CM

## 2016-04-22 ENCOUNTER — Ambulatory Visit: Payer: BLUE CROSS/BLUE SHIELD | Admitting: Family

## 2016-04-22 ENCOUNTER — Ambulatory Visit (HOSPITAL_COMMUNITY)
Admission: EM | Admit: 2016-04-22 | Discharge: 2016-04-22 | Disposition: A | Discharge status: Home / Self Care | Payer: BLUE CROSS/BLUE SHIELD | Attending: Emergency Medicine | Admitting: Emergency Medicine

## 2016-04-22 ENCOUNTER — Encounter (HOSPITAL_COMMUNITY): Payer: Self-pay | Admitting: Family Medicine

## 2016-04-22 ENCOUNTER — Ambulatory Visit (INDEPENDENT_AMBULATORY_CARE_PROVIDER_SITE_OTHER): Payer: BLUE CROSS/BLUE SHIELD

## 2016-04-22 DIAGNOSIS — M5412 Radiculopathy, cervical region: Secondary | ICD-10-CM

## 2016-04-22 MED ORDER — CYCLOBENZAPRINE HCL 5 MG PO TABS: 5.0000 mg | ORAL_TABLET | Freq: Every day | ORAL | 0 refills | Status: DC

## 2016-04-22 MED ORDER — PREDNISONE 50 MG PO TABS: ORAL_TABLET | ORAL | 0 refills | Status: DC

## 2016-04-22 MED ORDER — GABAPENTIN 300 MG PO CAPS: 300.0000 mg | ORAL_CAPSULE | Freq: Every day | ORAL | 0 refills | Status: DC

## 2016-04-22 NOTE — Discharge Instructions (Signed)
Your x-ray shows progression of arthritis changes. This is caused some narrowing with the nerves exit the spine. This is likely contributing to your pain. Take prednisone daily for 5 days. This is to get rid of inflammation. Try increasing your Flexeril to 2 tablets at bedtime for the next few days. Take gabapentin at bedtime to help with nerve irritation. Make an appointment with your regular doctor in the next few weeks. These medications may need to be titrated.

## 2016-04-22 NOTE — ED Triage Notes (Signed)
Pt here for 6 months of upper back pain, neck pain and right arm numbness.sts she has been doing therapy and the pain is not getting better it is getting worse. sts that she also does repetitive motion with arms and hand typing.

## 2016-04-22 NOTE — ED Provider Notes (Signed)
[facility]    CSN: [id number] Arrival date & time: 04/22/16  1110     History   Chief Complaint Chief Complaint  Patient presents with  . Back Pain  . Neck Pain  . Numbness    HPI Jennifer Waters is a 55 y.o. female.   HPI She is a 55 year old woman here for evaluation of upper back pain, shoulder pain, and right arm numbness and tingling. This has been ongoing for 6 months. She has been taking 800 mg of ibuprofen 3 times a day without improvement. She is also tried therapy which has helped some with the back and shoulder pain. The numbness in her arm has been getting worse. Sometimes it is the whole arm, but most of the time it is from the elbow down. She describes it as a pins and needles sensation. It is constant at this point. It is worsened by her work. She works at a call center and on a computer. She denies any weakness in the arm. No specific injury or trauma.  Past Medical History:  Diagnosis Date  . Arthritis   . Hypertension   . Migraines     Patient Active Problem List   Diagnosis Date Noted  . Cough 03/15/2016  . Upper back pain 12/11/2015  . Snoring 12/11/2015  . GERD (gastroesophageal reflux disease) 12/11/2015  . Laryngitis 05/13/2015  . Allergic rhinitis 05/01/2015  . Osteoarthritis 10/17/2014  . Essential hypertension 10/17/2014    Past Surgical History:  Procedure Laterality Date  . ABDOMINAL HYSTERECTOMY      OB History    No data available       Home Medications    Prior to Admission medications   Medication Sig Start Date End Date Taking? Authorizing Provider  albuterol (PROVENTIL HFA;VENTOLIN HFA) 108 (90 Base) MCG/ACT inhaler Inhale 2 puffs into the lungs every 6 (six) hours as needed for wheezing or shortness of breath. 03/15/16   Golden Circle, FNP  atenolol (TENORMIN) 100 MG tablet Take 100 mg by mouth daily.    Historical Provider, MD  cyclobenzaprine (FLEXERIL) 5 MG tablet Take 1-2 tablets (5-10 mg total) by  mouth at bedtime. 04/22/16   Melony Overly, MD  divalproex (DEPAKOTE) 500 MG DR tablet Take 500 mg by mouth 2 (two) times daily.  05/06/15   Historical Provider, MD  DUEXIS 800-26.6 MG TABS Take 1 tablet by mouth 3 times daily as needed. 04/14/16   Golden Circle, FNP  fluticasone (FLONASE) 50 MCG/ACT nasal spray Place 2 sprays into both nostrils daily. 12/11/15   Golden Circle, FNP  gabapentin (NEURONTIN) 300 MG capsule Take 1 capsule (300 mg total) by mouth at bedtime. 04/22/16   Melony Overly, MD  omeprazole (PRILOSEC) 20 MG capsule Take 1 capsule (20 mg total) by mouth daily. 12/11/15   Golden Circle, FNP  predniSONE (DELTASONE) 50 MG tablet Take 1 pill daily for 5 days. 04/22/16   Melony Overly, MD  venlafaxine XR (EFFEXOR-XR) 75 MG 24 hr capsule Take 75 mg by mouth 2 (two) times daily.  05/06/15   Historical Provider, MD    Family History Family History  Problem Relation Age of Onset  . Arthritis Mother   . Ovarian cancer Mother   . Migraines Mother     Social History Social History  Substance Use Topics  . Smoking status: Current Every Day Smoker    Packs/day: 0.50    Years: 20.00    Types: Cigarettes  .  Smokeless tobacco: Never Used  . Alcohol use 3.0 oz/week    5 Glasses of wine per week     Allergies   Review of patient's allergies indicates no known allergies.   Review of Systems Review of Systems As in history of present illness  Physical Exam Triage Vital Signs ED Triage Vitals  Enc Vitals Group     BP 04/22/16 1142 142/100     Pulse Rate 04/22/16 1142 111     Resp 04/22/16 1142 16     Temp 04/22/16 1142 98.5 F (36.9 C)     Temp src --      SpO2 04/22/16 1142 97 %     Weight --      Height --      Head Circumference --      Peak Flow --      Pain Score 04/22/16 1147 6     Pain Loc --      Pain Edu? --      Excl. in [place]? --    No data found.   Updated Vital Signs BP 142/100   Pulse 111   Temp 98.5 F (36.9 C)   Resp 16   SpO2 97%    Visual Acuity Right Eye Distance:   Left Eye Distance:   Bilateral Distance:    Right Eye Near:   Left Eye Near:    Bilateral Near:     Physical Exam  Constitutional: She is oriented to person, place, and time. She appears well-developed and well-nourished. No distress.  Cardiovascular: Normal rate.   Pulmonary/Chest: Effort normal.  Musculoskeletal:  Back: No vertebral tenderness. Diffuse muscle spasms and tenderness, primarily in the upper back and shoulders. Neck: No vertebral tenderness. No point tenderness. Right arm: 2+ radial pulse. No skin changes. Palpation around the ventral and lateral elbow area reproduces pins and needles sensation in lower arm.  Neurological: She is alert and oriented to person, place, and time.     UC Treatments / Results  Labs (all labs ordered are listed, but only abnormal results are displayed) Labs Reviewed - No data to display  EKG  EKG Interpretation None       Radiology Dg Cervical Spine Complete  Result Date: 04/22/2016 CLINICAL DATA:  Cervical radicular symptoms. EXAM: CERVICAL SPINE - COMPLETE 4+ VIEW COMPARISON:  Cervical spine x-rays 12/22/2003 FINDINGS: Normal alignment. Straightening of the cervical lordosis. Negative for fracture or mass. Prevertebral soft tissues normal. Disc degeneration and spondylosis at C4-5, C5-6, C6-7. Moderate right foraminal narrowing due to spurring at C4-5, C5-6, C6-7 has progressed in the interval. Mild left foraminal narrowing at C4-5, C5-6, and C6-7 also with progression. IMPRESSION: Progressive disc degeneration and spondylosis at C4-5, C5-6, and C6-7. There is foraminal narrowing at these levels bilaterally, right greater than left. Electronically Signed   By: Franchot Gallo M.D.   On: 04/22/2016 13:06    Procedures Procedures (including critical care time)  Medications Ordered in UC Medications - No data to display   Initial Impression / Assessment and Plan / UC Course  I have reviewed  the triage vital signs and the nursing notes.  Pertinent labs & imaging results that were available during my care of the patient were reviewed by me and considered in my medical decision making (see chart for details).  Clinical Course    X-ray concerning for radicular etiology. She also likely has some impingement in the forearm. Will treat with prednisone, Flexeril, and gabapentin. Follow-up with PCP for additional  management. Recommended massages to help with chronic back and shoulder pain.  Final Clinical Impressions(s) / UC Diagnoses   Final diagnoses:  Cervical radiculopathy    New Prescriptions Discharge Medication List as of 04/22/2016  1:23 PM    START taking these medications   Details  gabapentin (NEURONTIN) 300 MG capsule Take 1 capsule (300 mg total) by mouth at bedtime., Starting Thu 04/22/2016, Print    predniSONE (DELTASONE) 50 MG tablet Take 1 pill daily for 5 days., Print         Melony Overly, MD 04/22/16 [phone]

## 2016-05-04 ENCOUNTER — Encounter: Payer: Self-pay | Admitting: Family

## 2016-05-04 ENCOUNTER — Ambulatory Visit (INDEPENDENT_AMBULATORY_CARE_PROVIDER_SITE_OTHER): Payer: BLUE CROSS/BLUE SHIELD | Admitting: Family

## 2016-05-04 DIAGNOSIS — Z23 Encounter for immunization: Secondary | ICD-10-CM

## 2016-05-04 DIAGNOSIS — M5010 Cervical disc disorder with radiculopathy, unspecified cervical region: Secondary | ICD-10-CM

## 2016-05-04 MED ORDER — GABAPENTIN 300 MG PO CAPS: 300.0000 mg | ORAL_CAPSULE | Freq: Three times a day (TID) | ORAL | 0 refills | Status: DC

## 2016-05-04 NOTE — Progress Notes (Signed)
Subjective:    Patient ID: Jennifer Waters, female    DOB: 1961/03/16, 54 y.o.   MRN: [id number]  Chief Complaint  Patient presents with  . Follow-up    right arm pain, feels like a rubber band is put on her arm, numbness and tingling in the lower right arm, x6 months    HPI:  Jennifer Waters is a 55 y.o. female who  has a past medical history of Arthritis; Hypertension; and Migraines. and presents today for a follow up office visit.   Recently evaluated in the emergency department diagnosed with cervical radiculopathy including right arm numbness and tingling that has been going on for approximately 6 months. Cervical spine films revealed progressive disc degeneration and spondylosis at C4-5, C5-6 and C6-7. There was foraminal narrowing at these bilaterally on the right greater than left. Treated with a course of prednisone, Flexeril, and gabapentin. Reports taking medication as prescribed and denies adverse side effects. The prednisone and Flexeril. Help some. She did note some improvement after starting the gabapentin and questions additional medication if that would help. She continues to take gabapentin and Flexeril. Continues to experience neck pains with radiculopathy and down to her fingertips. Described as a pressure in the arm.   No Known Allergies   Outpatient Medications Prior to Visit  Medication Sig Dispense Refill  . albuterol (PROVENTIL HFA;VENTOLIN HFA) 108 (90 Base) MCG/ACT inhaler Inhale 2 puffs into the lungs every 6 (six) hours as needed for wheezing or shortness of breath. 1 Inhaler 0  . atenolol (TENORMIN) 100 MG tablet Take 100 mg by mouth daily.    . cyclobenzaprine (FLEXERIL) 5 MG tablet Take 1-2 tablets (5-10 mg total) by mouth at bedtime. 60 tablet 0  . divalproex (DEPAKOTE) 500 MG DR tablet Take 500 mg by mouth 2 (two) times daily.     . DUEXIS 800-26.6 MG TABS Take 1 tablet by mouth 3 times daily as needed. 90 tablet 0  . fluticasone (FLONASE) 50  MCG/ACT nasal spray Place 2 sprays into both nostrils daily. 16 g 6  . omeprazole (PRILOSEC) 20 MG capsule Take 1 capsule (20 mg total) by mouth daily. 90 capsule 1  . venlafaxine XR (EFFEXOR-XR) 75 MG 24 hr capsule Take 75 mg by mouth 2 (two) times daily.     Marland Kitchen gabapentin (NEURONTIN) 300 MG capsule Take 1 capsule (300 mg total) by mouth at bedtime. 30 capsule 0  . predniSONE (DELTASONE) 50 MG tablet Take 1 pill daily for 5 days. 5 tablet 0   No facility-administered medications prior to visit.     Review of Systems  Constitutional: Negative for chills and fever.  Musculoskeletal: Positive for neck pain and neck stiffness.  Neurological: Positive for numbness. Negative for weakness.      Objective:    BP (!) 162/92 (BP Location: Left Arm, Patient Position: Sitting, Cuff Size: Large)   Pulse 87   Temp 98.1 F (36.7 C) (Oral)   Resp 18   Ht 5\' 9"  (1.753 m)   Wt 253 lb (114.8 kg)   SpO2 93%   BMI 37.36 kg/m  Nursing note and vital signs reviewed.  Physical Exam  Constitutional: She is oriented to person, place, and time. She appears well-developed and well-nourished. No distress.  Neck:  Norris deformity, discoloration, or edema. There is mild tenderness and muscle spasm located in the right upper trapezius with no crepitus or deformity. Range of motion slightly decreased in lateral bending and rotation. Distal pulses and  sensation are intact and appropriate.  Cardiovascular: Normal rate, regular rhythm, normal heart sounds and intact distal pulses.   Pulmonary/Chest: Effort normal and breath sounds normal.  Neurological: She is alert and oriented to person, place, and time.  Skin: Skin is warm and dry.  Psychiatric: She has a normal mood and affect. Her behavior is normal. Judgment and thought content normal.       Assessment & Plan:   Problem List Items Addressed This Visit      Musculoskeletal and Integument   Cervical disc disorder with radiculopathy of cervical region      Cervical disc disorder with radiculopathy that has been resistant to conservative treatment. Continue current dosage of Flexeril. Increase gabapentin. Refer to physical therapy for further assessment and treatment. Consider MRI if symptoms do not improve. May benefit in the future from cortisone injection and possible referral to neurosurgery. Continue over-the-counter medications as needed for symptom relief and supportive care. Continue to monitor.      Relevant Orders   Ambulatory referral to Physical Therapy       I have discontinued Jennifer Waters's predniSONE and gabapentin. I have also changed her gabapentin. Additionally, I am having her maintain her atenolol, divalproex, venlafaxine XR, omeprazole, fluticasone, albuterol, DUEXIS, and cyclobenzaprine.   Meds ordered this encounter  Medications  . DISCONTD: gabapentin (NEURONTIN) 300 MG capsule    Sig: Take 1 capsule (300 mg total) by mouth 3 (three) times daily.    Dispense:  90 capsule    Refill:  0    Order Specific Question:   Supervising Provider    Answer:   Pricilla Holm A [id number]  . gabapentin (NEURONTIN) 300 MG capsule    Sig: Take 1 capsule (300 mg total) by mouth 3 (three) times daily.    Dispense:  90 capsule    Refill:  0    Order Specific Question:   Supervising Provider    Answer:   Pricilla Holm A [id number]     Follow-up: Return in about 3 weeks (around 05/25/2016).  Mauricio Po, FNP

## 2016-05-04 NOTE — Assessment & Plan Note (Signed)
Cervical disc disorder with radiculopathy that has been resistant to conservative treatment. Continue current dosage of Flexeril. Increase gabapentin. Refer to physical therapy for further assessment and treatment. Consider MRI if symptoms do not improve. May benefit in the future from cortisone injection and possible referral to neurosurgery. Continue over-the-counter medications as needed for symptom relief and supportive care. Continue to monitor.

## 2016-05-04 NOTE — Patient Instructions (Signed)
Thank you for choosing [facility].  SUMMARY AND INSTRUCTIONS:  Ice/moist heat 20 minutes every 2 hours as needed and after activity and before bed.  Increase gabapentin to 300 mg twice daily and on the following day increased to gabapentin 3 times daily.  Continue Flexeril as needed for muscle spasms.  Exercises and stretching multiple times through at the day.  They will call to establish her appointment with physical therapy.  Medication:  Your prescription(s) have been submitted to your pharmacy or been printed and provided for you. Please take as directed and contact our office if you believe you are having problem(s) with the medication(s) or have any questions.  Follow up:  If your symptoms worsen or fail to improve, please contact our office for further instruction, or in case of emergency go directly to the emergency room at the closest medical facility.    Cervical Strain and Sprain Rehab Ask your health care provider which exercises are safe for you. Do exercises exactly as told by your health care provider and adjust them as directed. It is normal to feel mild stretching, pulling, tightness, or discomfort as you do these exercises, but you should stop right away if you feel sudden pain or your pain gets worse.Do not begin these exercises until told by your health care provider. Stretching and range of motion exercises These exercises warm up your muscles and joints and improve the movement and flexibility of your neck. These exercises also help to relieve pain, numbness, and tingling. Exercise A: Cervical side bend 1. Using good posture, sit on a stable chair or stand up. 2. Without moving your shoulders, slowly tilt your left / right ear to your shoulder until you feel a stretch in your neck muscles. You should be looking straight ahead. 3. Hold for __________ seconds. 4. Repeat with the other side of your neck. Repeat __________ times. Complete this exercise  __________ times a day. Exercise B: Cervical rotation 1. Using good posture, sit on a stable chair or stand up. 2. Slowly turn your head to the side as if you are looking over your left / right shoulder.  Keep your eyes level with the ground.  Stop when you feel a stretch along the side and the back of your neck. 3. Hold for __________ seconds. 4. Repeat this by turning to your other side. Repeat __________ times. Complete this exercise __________ times a day. Exercise C: Thoracic extension and pectoral stretch 1. Roll a towel or a small blanket so it is about 4 inches (10 cm) in diameter. 2. Lie down on your back on a firm surface. 3. Put the towel lengthwise, under your spine in the middle of your back. It should not be not under your shoulder blades. The towel should line up with your spine from your middle back to your lower back. 4. Put your hands behind your head and let your elbows fall out to your sides. 5. Hold for __________ seconds. Repeat __________ times. Complete this exercise __________ times a day. Strengthening exercises These exercises build strength and endurance in your neck. Endurance is the ability to use your muscles for a long time, even after your muscles get tired. Exercise D: Upper cervical flexion, isometric 1. Lie on your back with a thin pillow behind your head and a small rolled-up towel under your neck. 2. Gently tuck your chin toward your chest and nod your head down to look toward your feet. Do not lift your head off the pillow.  3. Hold for __________ seconds. 4. Release the tension slowly. Relax your neck muscles completely before you repeat this exercise. Repeat __________ times. Complete this exercise __________ times a day. Exercise E: Cervical extension, isometric 1. Stand about 6 inches (15 cm) away from a wall, with your back facing the wall. 2. Place a soft object, about 6-8 inches (15-20 cm) in diameter, between the back of your head and the  wall. A soft object could be a small pillow, a ball, or a folded towel. 3. Gently tilt your head back and press into the soft object. Keep your jaw and forehead relaxed. 4. Hold for __________ seconds. 5. Release the tension slowly. Relax your neck muscles completely before you repeat this exercise. Repeat __________ times. Complete this exercise __________ times a day. Posture and body mechanics   Body mechanics refers to the movements and positions of your body while you do your daily activities. Posture is part of body mechanics. Good posture and healthy body mechanics can help to relieve stress in your body's tissues and joints. Good posture means that your spine is in its natural S-curve position (your spine is neutral), your shoulders are pulled back slightly, and your head is not tipped forward. The following are general guidelines for applying improved posture and body mechanics to your everyday activities. Standing  When standing, keep your spine neutral and keep your feet about hip-width apart. Keep a slight bend in your knees. Your ears, shoulders, and hips should line up.  When you do a task in which you stand in one place for a long time, place one foot up on a stable object that is 2-4 inches (5-10 cm) high, such as a footstool. This helps keep your spine neutral. Sitting  When sitting, keep your spine neutral and your keep feet flat on the floor. Use a footrest, if necessary, and keep your thighs parallel to the floor. Avoid rounding your shoulders, and avoid tilting your head forward.  When working at a desk or a computer, keep your desk at a height where your hands are slightly lower than your elbows. Slide your chair under your desk so you are close enough to maintain good posture.  When working at a computer, place your monitor at a height where you are looking straight ahead and you do not have to tilt your head forward or downward to look at the screen. Resting When lying  down and resting, avoid positions that are most painful for you. Try to support your neck in a neutral position. You can use a contour pillow or a small rolled-up towel. Your pillow should support your neck but not push on it. This information is not intended to replace advice given to you by your health care provider. Make sure you discuss any questions you have with your health care provider. Document Released: 06/07/2005 Document Revised: 02/12/2016 Document Reviewed: 05/14/2015 Elsevier Interactive Patient Education  2017 [employer].

## 2016-06-24 ENCOUNTER — Other Ambulatory Visit: Payer: Self-pay | Admitting: Family

## 2016-06-24 NOTE — Telephone Encounter (Signed)
Last refill was 05/04/16

## 2016-07-14 ENCOUNTER — Encounter: Payer: Self-pay | Admitting: Family

## 2016-07-24 ENCOUNTER — Other Ambulatory Visit: Payer: Self-pay | Admitting: Family

## 2016-07-24 DIAGNOSIS — M549 Dorsalgia, unspecified: Secondary | ICD-10-CM

## 2016-08-17 ENCOUNTER — Telehealth: Payer: Self-pay | Admitting: *Deleted

## 2016-08-17 NOTE — Telephone Encounter (Signed)
Left msg on triage stating needing to get clinical information for PA on Duexis 800/26.6. Can faxed to [phone]. Printed Gregs note from 12/11/15 when med was rx...Johny Chess

## 2016-11-22 ENCOUNTER — Encounter: Payer: Self-pay | Admitting: Family

## 2016-11-22 ENCOUNTER — Ambulatory Visit (INDEPENDENT_AMBULATORY_CARE_PROVIDER_SITE_OTHER): Payer: BLUE CROSS/BLUE SHIELD | Admitting: Family

## 2016-11-22 VITALS — BP 184/102 | HR 88 | Temp 98.3°F | Resp 18 | Ht 69.0 in | Wt 245.0 lb

## 2016-11-22 DIAGNOSIS — F329 Major depressive disorder, single episode, unspecified: Secondary | ICD-10-CM | POA: Insufficient documentation

## 2016-11-22 DIAGNOSIS — I10 Essential (primary) hypertension: Secondary | ICD-10-CM

## 2016-11-22 DIAGNOSIS — M159 Polyosteoarthritis, unspecified: Secondary | ICD-10-CM | POA: Diagnosis not present

## 2016-11-22 DIAGNOSIS — F331 Major depressive disorder, recurrent, moderate: Secondary | ICD-10-CM | POA: Diagnosis not present

## 2016-11-22 DIAGNOSIS — F32A Depression, unspecified: Secondary | ICD-10-CM | POA: Insufficient documentation

## 2016-11-22 DIAGNOSIS — F419 Anxiety disorder, unspecified: Secondary | ICD-10-CM

## 2016-11-22 DIAGNOSIS — M549 Dorsalgia, unspecified: Secondary | ICD-10-CM

## 2016-11-22 MED ORDER — BUPROPION HCL ER (SR) 150 MG PO TB12: ORAL_TABLET | ORAL | 1 refills | Status: DC

## 2016-11-22 MED ORDER — CETIRIZINE HCL 10 MG PO TABS: 10.0000 mg | ORAL_TABLET | Freq: Every day | ORAL | 11 refills | Status: DC

## 2016-11-22 MED ORDER — NEBIVOLOL HCL 10 MG PO TABS: 10.0000 mg | ORAL_TABLET | Freq: Every day | ORAL | 0 refills | Status: DC

## 2016-11-22 MED ORDER — IBUPROFEN-FAMOTIDINE 800-26.6 MG PO TABS: 1.0000 | ORAL_TABLET | Freq: Three times a day (TID) | ORAL | 2 refills | Status: DC | PRN

## 2016-11-22 NOTE — Assessment & Plan Note (Signed)
Blood pressure poorly controlled and above goal 140/90 with no current medications. Start Bystolic with sample provided. Encouraged a low sodium diet. Denies worse headache of life with no symptoms of end organ damage noted on physical exam. Follow-up in one month or sooner if needed.

## 2016-11-22 NOTE — Progress Notes (Signed)
Subjective:    Patient ID: Jennifer Waters, female    DOB: March 05, 1961, 56 y.o.   MRN: [id number]  Chief Complaint  Patient presents with  . Hypertension    has been without BP meds for a couple of months, needs refill    HPI:  Jennifer Waters is a 56 y.o. female who  has a past medical history of Arthritis; Hypertension; and Migraines. and presents today for a follow up office visit.  1.) Blood pressure - Currently prescribed on atenolol and reports she has not taken her medication in the past couple of months. Blood pressures at home have been elevated with no hypotensive symptoms. Denies changes in vision, worst headache of life or new symptoms of end organ damage. Not currently working on a low sodium intake. Does continue to smoke.   BP Readings from Last 3 Encounters:  11/22/16 (!) 184/102  05/04/16 (!) 162/92  04/22/16 142/100    2.) Hand pain - This is a new problem. Associated symptom of pain located in her left hand has been going on for about 24 hours. Denies any injury or trauma. Pain is described as achy and located across her MCP joints. Modifying factors include Duexis which did seem to help. No numbness or tingling. Denies any strenuous activities but does do a lot of typing.    3.) Depression - Currently maintained on 225 mg of Effexor daily and notes continues to have symptoms of depression and lack of interest in doing things. Denies suicidal ideations. She continues to work at home. Severity of her symptoms can keep her at home and even in bed at times.   No Known Allergies    Outpatient Medications Prior to Visit  Medication Sig Dispense Refill  . albuterol (PROVENTIL HFA;VENTOLIN HFA) 108 (90 Base) MCG/ACT inhaler Inhale 2 puffs into the lungs every 6 (six) hours as needed for wheezing or shortness of breath. 1 Inhaler 0  . cyclobenzaprine (FLEXERIL) 5 MG tablet Take 1-2 tablets (5-10 mg total) by mouth at bedtime. 60 tablet 0  . divalproex (DEPAKOTE)  500 MG DR tablet Take 500 mg by mouth 2 (two) times daily.     . fluticasone (FLONASE) 50 MCG/ACT nasal spray Place 2 sprays into both nostrils daily. 16 g 6  . gabapentin (NEURONTIN) 300 MG capsule TAKE 1 CAPSULE BY MOUTH 3 TIMES DAILY 90 capsule 0  . omeprazole (PRILOSEC) 20 MG capsule Take 1 capsule (20 mg total) by mouth daily. 90 capsule 1  . venlafaxine XR (EFFEXOR-XR) 75 MG 24 hr capsule Take 75 mg by mouth 2 (two) times daily.     Marland Kitchen atenolol (TENORMIN) 100 MG tablet Take 100 mg by mouth daily.    . DUEXIS 800-26.6 MG TABS Take 1 tablet by mouth 3 times daily as needed. 90 tablet 0   No facility-administered medications prior to visit.     Review of Systems  Constitutional: Negative for chills and fever.  Eyes:       Negative for changes in vision  Respiratory: Negative for cough, chest tightness and wheezing.   Cardiovascular: Negative for chest pain, palpitations and leg swelling.  Neurological: Negative for dizziness, weakness and light-headedness.      Objective:    BP (!) 184/102 (BP Location: Left Arm, Patient Position: Sitting, Cuff Size: Large)   Pulse 88   Temp 98.3 F (36.8 C) (Oral)   Resp 18   Ht 5\' 9"  (1.753 m)   Wt 245 lb (111.1 kg)  SpO2 96%   BMI 36.18 kg/m  Nursing note and vital signs reviewed.  Physical Exam  Constitutional: She is oriented to person, place, and time. She appears well-developed and well-nourished. No distress.  Cardiovascular: Normal rate, regular rhythm, normal heart sounds and intact distal pulses.   Pulmonary/Chest: Effort normal and breath sounds normal.  Musculoskeletal:  Left hand - no obvious deformity or discoloration with mild/moderate edema around the metacarpal phalangeal joints. Range of motion within normal limits with mild discomfort noted in full flexion and extension. Capillary refill and pulses are intact and appropriate. Sensation is intact and appropriate.  Neurological: She is alert and oriented to person, place,  and time.  Skin: Skin is warm and dry.  Psychiatric: She has a normal mood and affect. Her behavior is normal. Judgment and thought content normal.       Assessment & Plan:   Problem List Items Addressed This Visit      Cardiovascular and Mediastinum   Essential hypertension - Primary    Blood pressure poorly controlled and above goal 140/90 with no current medications. Start Bystolic with sample provided. Encouraged a low sodium diet. Denies worse headache of life with no symptoms of end organ damage noted on physical exam. Follow-up in one month or sooner if needed.      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 Continue Duexis as needed for inflammation. Icing regimen to help with pain. Continue to monitor.       Relevant Medications   Ibuprofen-Famotidine (DUEXIS) 800-26.6 MG TABS     Other   Upper back pain   Relevant Medications   Ibuprofen-Famotidine (DUEXIS) 800-26.6 MG TABS   Depression    Moderate depression without psychotic features poorly controlled with venlafaxine and may be related to her current working situation by working at home. Declines counseling at this time. Decrease venlafaxine to 150 mg daily and start bupropion. Denies suicidal ideations. Continue to monitor and follow-up in one month or sooner if needed.      Relevant Medications   buPROPion (WELLBUTRIN SR) 150 MG 12 hr tablet       I have discontinued Ms. Flicker's atenolol. I have also changed her DUEXIS to Ibuprofen-Famotidine. Additionally, I am having her start on nebivolol, buPROPion, and cetirizine. Lastly, I am having her maintain her divalproex, venlafaxine XR, omeprazole, fluticasone, albuterol, cyclobenzaprine, and gabapentin.   Meds ordered this encounter    Medications  . nebivolol (BYSTOLIC) 10 MG tablet    Sig: Take 1 tablet (10 mg total) by mouth daily.    Dispense:  90 tablet    Refill:  0    Order Specific Question:   Supervising Provider    Answer:   Pricilla Holm A [[id number]]  . buPROPion (WELLBUTRIN SR) 150 MG 12 hr tablet    Sig: Take 1 tablet by mouth daily for 3 days then 1 tablet twice daily.    Dispense:  60 tablet    Refill:  1    Will decrease Effexor to 150 mg in 1 week.    Order Specific Question:   Supervising Provider    Answer:   Pricilla Holm A [[id number]]  . cetirizine (ZYRTEC) 10 MG tablet    Sig: Take 1 tablet (10 mg total) by mouth daily.  Dispense:  30 tablet    Refill:  11    Order Specific Question:   Supervising Provider    Answer:   Pricilla Holm A [[id number]]  . Ibuprofen-Famotidine (DUEXIS) 800-26.6 MG TABS    Sig: Take 1 tablet by mouth 3 (three) times daily as needed.    Dispense:  90 tablet    Refill:  2    Order Specific Question:   Supervising Provider    Answer:   Pricilla Holm A [[id number]]     Follow-up: Return in about 1 month (around 12/22/2016), or if symptoms worsen or fail to improve.  Mauricio Po, FNP

## 2016-11-22 NOTE — Assessment & Plan Note (Signed)
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 Continue Duexis as needed for inflammation. Icing regimen to help with pain. Continue to monitor.

## 2016-11-22 NOTE — Patient Instructions (Addendum)
Thank you for choosing [facility].  SUMMARY AND INSTRUCTIONS:  Continue to monitor your blood pressure at homes at least 1x per day at different times per day.  Follow a low sodium diet.   Ice your joints for 20 minutes every 2 hours or as needed.   Continue with Duexis.   Start burproprion as instructed and decreased Effexor to 150 mg daily in 1 week.   Follow up in 1 month or sooner.   Medication:  Your prescription(s) have been submitted to your pharmacy or been printed and provided for you. Please take as directed and contact our office if you believe you are having problem(s) with the medication(s) or have any questions.   Follow up:  If your symptoms worsen or fail to improve, please contact our office for further instruction, or in case of emergency go directly to the emergency room at the closest medical facility.

## 2016-11-22 NOTE — Assessment & Plan Note (Signed)
Moderate depression without psychotic features poorly controlled with venlafaxine and may be related to her current working situation by working at home. Declines counseling at this time. Decrease venlafaxine to 150 mg daily and start bupropion. Denies suicidal ideations. Continue to monitor and follow-up in one month or sooner if needed.

## 2016-12-13 ENCOUNTER — Telehealth: Payer: Self-pay | Admitting: *Deleted

## 2016-12-13 MED ORDER — VALACYCLOVIR HCL 500 MG PO TABS: 500.0000 mg | ORAL_TABLET | Freq: Two times a day (BID) | ORAL | 3 refills | Status: DC

## 2016-12-13 NOTE — Addendum Note (Signed)
Addended by: Mauricio Po D on: 12/13/2016 01:14 PM   Modules accepted: Orders

## 2016-12-13 NOTE — Telephone Encounter (Signed)
Called pt she stated that she only takes it for break-outs. She haven't had a break out in a while, but been up under a lot of stress due to death in family, and now she is needing med...Johny Chess

## 2016-12-13 NOTE — Telephone Encounter (Signed)
Medication sent to pharmacy  

## 2016-12-13 NOTE — Telephone Encounter (Signed)
Rec'd call pt states she is needing new rx for her valtrex 500 mg was originally rx by her previous PCP, and she no longer see anymore. Pls advise...Jennifer Waters

## 2016-12-13 NOTE — Telephone Encounter (Signed)
How was this previously prescribed? Is she taking it daily or for out breaks?

## 2017-01-14 ENCOUNTER — Ambulatory Visit (INDEPENDENT_AMBULATORY_CARE_PROVIDER_SITE_OTHER): Payer: BLUE CROSS/BLUE SHIELD | Admitting: Family

## 2017-01-14 ENCOUNTER — Encounter: Payer: Self-pay | Admitting: Family

## 2017-01-14 VITALS — BP 170/100 | HR 98 | Temp 98.5°F | Resp 16 | Ht 69.0 in | Wt 225.0 lb

## 2017-01-14 DIAGNOSIS — I10 Essential (primary) hypertension: Secondary | ICD-10-CM

## 2017-01-14 MED ORDER — NEBIVOLOL HCL 20 MG PO TABS: 20.0000 mg | ORAL_TABLET | Freq: Every day | ORAL | 0 refills | Status: DC

## 2017-01-14 NOTE — Assessment & Plan Note (Signed)
Blood pressure poorly controlled with current regimen and likely related to increased caffeine intake. Increase Bystolic. Decrease caffeine intake. Encouraged to monitor blood pressure at home and follow low sodium diet. Follow up in 1 month or sooner if needed.

## 2017-01-14 NOTE — Patient Instructions (Addendum)
Thank you for choosing [facility].  SUMMARY AND INSTRUCTIONS:  Increase your Bystolic to 20 mg.  Continue to monitor your blood pressure at home at differing times thoughout the day.  Decrease caffeine intake and increase water intake.   Follow up with medication names.   Follow up:  If your symptoms worsen or fail to improve, please contact our office for further instruction, or in case of emergency go directly to the emergency room at the closest medical facility.

## 2017-01-14 NOTE — Progress Notes (Signed)
Subjective:    Patient ID: Jennifer Waters, female    DOB: January 31, 1961, 56 y.o.   MRN: [id number]  Chief Complaint  Patient presents with  . Follow-up    blood pressure    HPI:  Jennifer Waters is a 56 y.o. female who  has a past medical history of Arthritis; Hypertension; and Migraines. and presents today for a follow up office visit.   1.) Hypertension - Currently maintained on nebivolol  and reports taking the medications as prescribed and denies adverse side effects or hypotensive readings. Not currently checking blood pressure at home. Denies changes in vision, worst headache of life or new symptoms of end organ damage. Working on following a low sodium diet. She did have a significant amount of coffee prior to the office visit as well as a significant amount of personal and work related stressors.   BP Readings from Last 3 Encounters:  01/14/17 (!) 170/100  11/22/16 (!) 184/102  05/04/16 (!) 162/92     No Known Allergies    Outpatient Medications Prior to Visit  Medication Sig Dispense Refill  . albuterol (PROVENTIL HFA;VENTOLIN HFA) 108 (90 Base) MCG/ACT inhaler Inhale 2 puffs into the lungs every 6 (six) hours as needed for wheezing or shortness of breath. 1 Inhaler 0  . buPROPion (WELLBUTRIN SR) 150 MG 12 hr tablet Take 1 tablet by mouth daily for 3 days then 1 tablet twice daily. 60 tablet 1  . cetirizine (ZYRTEC) 10 MG tablet Take 1 tablet (10 mg total) by mouth daily. 30 tablet 11  . cyclobenzaprine (FLEXERIL) 5 MG tablet Take 1-2 tablets (5-10 mg total) by mouth at bedtime. 60 tablet 0  . divalproex (DEPAKOTE) 500 MG DR tablet Take 500 mg by mouth 2 (two) times daily.     . fluticasone (FLONASE) 50 MCG/ACT nasal spray Place 2 sprays into both nostrils daily. 16 g 6  . gabapentin (NEURONTIN) 300 MG capsule TAKE 1 CAPSULE BY MOUTH 3 TIMES DAILY 90 capsule 0  . Ibuprofen-Famotidine (DUEXIS) 800-26.6 MG TABS Take 1 tablet by mouth 3 (three) times daily as needed.  90 tablet 2  . omeprazole (PRILOSEC) 20 MG capsule Take 1 capsule (20 mg total) by mouth daily. 90 capsule 1  . valACYclovir (VALTREX) 500 MG tablet Take 1 tablet (500 mg total) by mouth 2 (two) times daily. 6 tablet 3  . venlafaxine XR (EFFEXOR-XR) 75 MG 24 hr capsule Take 75 mg by mouth 2 (two) times daily.     . nebivolol (BYSTOLIC) 10 MG tablet Take 1 tablet (10 mg total) by mouth daily. 90 tablet 0   No facility-administered medications prior to visit.     Review of Systems  Constitutional: Negative for chills and fever.  Eyes:       Negative for changes in vision  Respiratory: Negative for cough, chest tightness and wheezing.   Cardiovascular: Negative for chest pain, palpitations and leg swelling.  Neurological: Negative for dizziness, weakness and light-headedness.      Objective:    BP (!) 170/100 (BP Location: Left Arm, Patient Position: Sitting, Cuff Size: Large)   Pulse 98   Temp 98.5 F (36.9 C) (Oral)   Resp 16   Ht 5\' 9"  (1.753 m)   Wt 225 lb (102.1 kg)   SpO2 98%   BMI 33.23 kg/m  Nursing note and vital signs reviewed.  Physical Exam  Constitutional: She is oriented to person, place, and time. She appears well-developed and well-nourished. No distress.  Cardiovascular: Normal rate, regular rhythm, normal heart sounds and intact distal pulses.   Pulmonary/Chest: Effort normal and breath sounds normal.  Neurological: She is alert and oriented to person, place, and time.  Skin: Skin is warm and dry.  Psychiatric: She has a normal mood and affect. Her behavior is normal. Judgment and thought content normal.       Assessment & Plan:   Problem List Items Addressed This Visit      Cardiovascular and Mediastinum   Essential hypertension - Primary    Blood pressure poorly controlled with current regimen and likely related to increased caffeine intake. Increase Bystolic. Decrease caffeine intake. Encouraged to monitor blood pressure at home and follow low sodium  diet. Follow up in 1 month or sooner if needed.       Relevant Medications   Nebivolol HCl (BYSTOLIC) 20 MG TABS       I have discontinued Ms. Eshbach's nebivolol. I am also having her start on Nebivolol HCl. Additionally, I am having her maintain her divalproex, venlafaxine XR, omeprazole, fluticasone, albuterol, cyclobenzaprine, gabapentin, buPROPion, cetirizine, Ibuprofen-Famotidine, and valACYclovir.   Meds ordered this encounter  Medications  . Nebivolol HCl (BYSTOLIC) 20 MG TABS    Sig: Take 1 tablet (20 mg total) by mouth daily.    Dispense:  90 tablet    Refill:  0    Order Specific Question:   Supervising Provider    Answer:   Pricilla Holm A [[id number]]     Follow-up: Return in about 1 month (around 02/14/2017), or if symptoms worsen or fail to improve.  Mauricio Po, FNP

## 2017-01-17 ENCOUNTER — Other Ambulatory Visit: Payer: Self-pay | Admitting: Family

## 2017-01-17 DIAGNOSIS — K219 Gastro-esophageal reflux disease without esophagitis: Secondary | ICD-10-CM

## 2017-02-15 ENCOUNTER — Telehealth: Payer: Self-pay | Admitting: Family

## 2017-02-15 MED ORDER — FLUTICASONE PROPIONATE 50 MCG/ACT NA SUSP: 2.0000 | Freq: Every day | NASAL | 6 refills | Status: DC

## 2017-02-15 NOTE — Telephone Encounter (Signed)
Sent rx to [facility] 

## 2017-02-15 NOTE — Telephone Encounter (Signed)
Patient is needing script for flonase to be sent to [facility] at [address].  Patient needs script in order to use health savings card.

## 2017-02-25 ENCOUNTER — Encounter: Payer: Self-pay | Admitting: Family

## 2017-02-25 ENCOUNTER — Ambulatory Visit (INDEPENDENT_AMBULATORY_CARE_PROVIDER_SITE_OTHER): Payer: BLUE CROSS/BLUE SHIELD | Admitting: Family

## 2017-02-25 VITALS — BP 148/90 | HR 76 | Resp 16 | Ht 69.0 in | Wt 229.0 lb

## 2017-02-25 DIAGNOSIS — Z23 Encounter for immunization: Secondary | ICD-10-CM | POA: Diagnosis not present

## 2017-02-25 DIAGNOSIS — I10 Essential (primary) hypertension: Secondary | ICD-10-CM

## 2017-02-25 DIAGNOSIS — L609 Nail disorder, unspecified: Secondary | ICD-10-CM | POA: Diagnosis not present

## 2017-02-25 MED ORDER — NEBIVOLOL HCL 20 MG PO TABS: 30.0000 mg | ORAL_TABLET | Freq: Every day | ORAL | 0 refills | Status: DC

## 2017-02-25 NOTE — Assessment & Plan Note (Signed)
Blood pressures improved with current medication regimen and decrease caffeine intake although above goal of 140/90. Increase Bystolic to 30 mg daily. Encouraged to monitor blood pressure at home and follow sodium diet. Denies worse headache of life with no symptoms of end organ damage noted on physical exam. Follow-up in one month for nurse visit. Continue to monitor.

## 2017-02-25 NOTE — Assessment & Plan Note (Signed)
New onset right thumbnail cracking with no significant origin for vitamin/mineral deficiency. Refer to dermatology for further assessment and treatment. Follow up if symptoms worsen or do not improve.

## 2017-02-25 NOTE — Progress Notes (Signed)
Subjective:    Patient ID: Jennifer Waters, female    DOB: [birth year], 56 y.o.   MRN: [id number]  Chief Complaint  Patient presents with  . Follow-up    hypertension    HPI:  Jennifer Waters is a 56 y.o. female who  has a past medical history of Arthritis; Hypertension; and Migraines. and presents today for a follow up office visit.  1.) Hypertension - Currently maintained on Bystolic and reports taking the medications as prescribed and denies adverse side effects or hypotensive readings. Not currently checking blood pressures at home. Has cut down significantly on caffeine intake. Denies changes in vision, worst headache of life or new symptoms of end organ damage. Working on following a low sodium diet.   BP Readings from Last 3 Encounters:  02/25/17 (!) 148/90  01/14/17 (!) 170/100  11/22/16 (!) 184/102     2.) Nail problem - This is a new problem. Associated symptom of cracking of her right thumbnail with no other nail problems has been going on for a couple of months and has been refractory to the modifying factors of over the counter medications / treatments. She has also tried trimming the nail and it continues to crack.    No Known Allergies    Outpatient Medications Prior to Visit  Medication Sig Dispense Refill  . albuterol (PROVENTIL HFA;VENTOLIN HFA) 108 (90 Base) MCG/ACT inhaler Inhale 2 puffs into the lungs every 6 (six) hours as needed for wheezing or shortness of breath. 1 Inhaler 0  . buPROPion (WELLBUTRIN SR) 150 MG 12 hr tablet Take 1 tablet by mouth daily for 3 days then 1 tablet twice daily. 60 tablet 1  . cetirizine (ZYRTEC) 10 MG tablet Take 1 tablet (10 mg total) by mouth daily. 30 tablet 11  . cyclobenzaprine (FLEXERIL) 5 MG tablet Take 1-2 tablets (5-10 mg total) by mouth at bedtime. 60 tablet 0  . divalproex (DEPAKOTE) 500 MG DR tablet Take 500 mg by mouth 2 (two) times daily.     . fluticasone (FLONASE) 50 MCG/ACT nasal spray Place 2 sprays into  both nostrils daily. 16 g 6  . gabapentin (NEURONTIN) 300 MG capsule TAKE 1 CAPSULE BY MOUTH 3 TIMES DAILY 90 capsule 0  . Ibuprofen-Famotidine (DUEXIS) 800-26.6 MG TABS Take 1 tablet by mouth 3 (three) times daily as needed. 90 tablet 2  . omeprazole (PRILOSEC) 20 MG capsule TAKE 1 CAPSULE BY MOUTH EVERY DAY 90 capsule 0  . valACYclovir (VALTREX) 500 MG tablet Take 1 tablet (500 mg total) by mouth 2 (two) times daily. 6 tablet 3  . venlafaxine XR (EFFEXOR-XR) 75 MG 24 hr capsule Take 75 mg by mouth 2 (two) times daily.     . Nebivolol HCl (BYSTOLIC) 20 MG TABS Take 1 tablet (20 mg total) by mouth daily. 90 tablet 0   No facility-administered medications prior to visit.      Past Medical History:  Diagnosis Date  . Arthritis   . Hypertension   . Migraines     Review of Systems  Constitutional: Negative for chills and fever.  Eyes:       Negative for changes in vision  Respiratory: Negative for cough, chest tightness and wheezing.   Cardiovascular: Negative for chest pain, palpitations and leg swelling.  Neurological: Negative for dizziness, weakness and light-headedness.      Objective:    BP (!) 148/90 (BP Location: Left Arm, Patient Position: Sitting, Cuff Size: Large)   Pulse 76  Resp 16   Ht 5\' 9"  (1.753 m)   Wt 229 lb (103.9 kg)   SpO2 96%   BMI 33.82 kg/m  Nursing note and vital signs reviewed.  Physical Exam  Constitutional: She is oriented to person, place, and time. She appears well-developed and well-nourished. No distress.  Cardiovascular: Normal rate, regular rhythm, normal heart sounds and intact distal pulses.   Pulmonary/Chest: Effort normal and breath sounds normal.  Neurological: She is alert and oriented to person, place, and time.  Skin: Skin is warm and dry.  Right thumbnail cracked with questionable discoloration. No changes to other nails noted.   Psychiatric: She has a normal mood and affect. Her behavior is normal. Judgment and thought content  normal.       Assessment & Plan:   Problem List Items Addressed This Visit      Cardiovascular and Mediastinum   Essential hypertension - Primary    Blood pressures improved with current medication regimen and decrease caffeine intake although above goal of 140/90. Increase Bystolic to 30 mg daily. Encouraged to monitor blood pressure at home and follow sodium diet. Denies worse headache of life with no symptoms of end organ damage noted on physical exam. Follow-up in one month for nurse visit. Continue to monitor.      Relevant Medications   Nebivolol HCl (BYSTOLIC) 20 MG TABS     Other   Nail problem    New onset right thumbnail cracking with no significant origin for vitamin/mineral deficiency. Refer to dermatology for further assessment and treatment. Follow up if symptoms worsen or do not improve.        Other Visit Diagnoses    Need for influenza vaccination       Relevant Orders   Flu Vaccine QUAD 36+ mos IM (Completed)       I have changed Ms. Lucien's Nebivolol HCl. I am also having her maintain her divalproex, venlafaxine XR, albuterol, cyclobenzaprine, gabapentin, buPROPion, cetirizine, Ibuprofen-Famotidine, valACYclovir, omeprazole, and fluticasone.   Meds ordered this encounter  Medications  . Nebivolol HCl (BYSTOLIC) 20 MG TABS    Sig: Take 1.5 tablets (30 mg total) by mouth daily.    Dispense:  90 tablet    Refill:  0    Order Specific Question:   Supervising Provider    Answer:   Pricilla Holm A [[id number]]     Follow-up: Return in about 1 month (around 03/27/2017), or if symptoms worsen or fail to improve.  Mauricio Po, FNP

## 2017-02-25 NOTE — Patient Instructions (Signed)
Thank you for choosing [facility].  SUMMARY AND INSTRUCTIONS:  Please increase Bystolic to 30 mg.  Monitor your blood pressure at home at different times throughout the day  Excellent job with decreasing caffeine intake.  Continue with low sodium intake.  Follow up with blood pressure readings in about 1 month for nurse visit.   Medication:  Your prescription(s) have been submitted to your pharmacy or been printed and provided for you. Please take as directed and contact our office if you believe you are having problem(s) with the medication(s) or have any questions.  Follow up:  If your symptoms worsen or fail to improve, please contact our office for further instruction, or in case of emergency go directly to the emergency room at the closest medical facility.

## 2017-03-01 ENCOUNTER — Other Ambulatory Visit: Payer: Self-pay | Admitting: Family

## 2017-04-06 ENCOUNTER — Telehealth: Payer: Self-pay | Admitting: *Deleted

## 2017-04-06 MED ORDER — NEBIVOLOL HCL 20 MG PO TABS: 30.0000 mg | ORAL_TABLET | Freq: Every day | ORAL | 1 refills | Status: DC

## 2017-04-06 NOTE — Telephone Encounter (Signed)
Rec'd call pt states she has made appt w/Ashleigh for 12/16. She is needing refill on her BP med (Bystolic). Until appt. Verified pharmacy inform will send enough until appt...Jennifer Waters

## 2017-05-16 ENCOUNTER — Telehealth: Payer: Self-pay | Admitting: Nurse Practitioner

## 2017-05-16 MED ORDER — NEBIVOLOL HCL 20 MG PO TABS: 30.0000 mg | ORAL_TABLET | Freq: Every day | ORAL | 0 refills | Status: DC

## 2017-05-16 NOTE — Telephone Encounter (Signed)
Per chart pt has made ap[pt w/Ashleigh for 06/03/17. Will send 90 day only must keep appt for refills...Jennifer Waters

## 2017-05-16 NOTE — Telephone Encounter (Signed)
Patient requesting bystolic 90 day script to be sent to [facility] - states her insurance will save her $150.00 this way.

## 2017-05-17 ENCOUNTER — Telehealth: Payer: Self-pay | Admitting: Nurse Practitioner

## 2017-05-17 MED ORDER — VENLAFAXINE HCL ER 75 MG PO CP24: 75.0000 mg | ORAL_CAPSULE | Freq: Two times a day (BID) | ORAL | 0 refills | Status: DC

## 2017-05-17 NOTE — Telephone Encounter (Signed)
Okay to refill 90 day supply 

## 2017-05-17 NOTE — Telephone Encounter (Signed)
Called pt no answer LMOM 90 day [facility]...Jennifer Waters

## 2017-05-17 NOTE — Telephone Encounter (Signed)
Pls advise if 90 day Effexor script is ok. Pt has appt schedule to see you 06/03/17.Marland KitchenJohny Chess

## 2017-05-17 NOTE — Telephone Encounter (Signed)
Copied from [place]. Topic: Quick Communication - See Telephone Encounter >> May 17, 2017 11:06 AM Burnis Medin, NT wrote: CRM for notification. See Telephone encounter for: Pt is calling in about  getting a prescription refill venlafaxine XR (EFFEXOR-XR) 75 MG 24 hr capsule. [facility]   05/17/17.

## 2017-05-17 NOTE — Telephone Encounter (Signed)
Review for refill- needs 90 day supply

## 2017-05-18 ENCOUNTER — Other Ambulatory Visit: Payer: Self-pay | Admitting: *Deleted

## 2017-05-18 DIAGNOSIS — M159 Polyosteoarthritis, unspecified: Secondary | ICD-10-CM

## 2017-05-18 MED ORDER — IBUPROFEN-FAMOTIDINE 800-26.6 MG PO TABS: 1.0000 | ORAL_TABLET | Freq: Three times a day (TID) | ORAL | 0 refills | Status: DC | PRN

## 2017-05-30 ENCOUNTER — Telehealth: Payer: Self-pay | Admitting: Nurse Practitioner

## 2017-05-30 NOTE — Telephone Encounter (Signed)
Copied from [place] [id number]. Topic: Quick Communication - See Telephone Encounter >> May 30, 2017 12:59 PM Cleaster Corin, NT wrote: CRM for notification. See Telephone encounter for:   05/30/17. Rep. From [facility] pre authorization dept. Called and needs clinical chart notes for Duexis tablets for piror auth.

## 2017-06-03 ENCOUNTER — Ambulatory Visit (INDEPENDENT_AMBULATORY_CARE_PROVIDER_SITE_OTHER): Payer: BLUE CROSS/BLUE SHIELD | Admitting: Nurse Practitioner

## 2017-06-03 ENCOUNTER — Encounter: Payer: Self-pay | Admitting: Nurse Practitioner

## 2017-06-03 ENCOUNTER — Other Ambulatory Visit (INDEPENDENT_AMBULATORY_CARE_PROVIDER_SITE_OTHER): Payer: BLUE CROSS/BLUE SHIELD

## 2017-06-03 VITALS — BP 160/98 | HR 90 | Temp 99.1°F | Resp 16 | Ht 69.0 in | Wt 234.0 lb

## 2017-06-03 DIAGNOSIS — I10 Essential (primary) hypertension: Secondary | ICD-10-CM

## 2017-06-03 DIAGNOSIS — M159 Polyosteoarthritis, unspecified: Secondary | ICD-10-CM

## 2017-06-03 DIAGNOSIS — R079 Chest pain, unspecified: Secondary | ICD-10-CM

## 2017-06-03 DIAGNOSIS — M791 Myalgia, unspecified site: Secondary | ICD-10-CM | POA: Diagnosis not present

## 2017-06-03 DIAGNOSIS — Z23 Encounter for immunization: Secondary | ICD-10-CM

## 2017-06-03 DIAGNOSIS — F331 Major depressive disorder, recurrent, moderate: Secondary | ICD-10-CM | POA: Diagnosis not present

## 2017-06-03 DIAGNOSIS — K219 Gastro-esophageal reflux disease without esophagitis: Secondary | ICD-10-CM | POA: Diagnosis not present

## 2017-06-03 DIAGNOSIS — F17219 Nicotine dependence, cigarettes, with unspecified nicotine-induced disorders: Secondary | ICD-10-CM

## 2017-06-03 LAB — COMPREHENSIVE METABOLIC PANEL
ALBUMIN: 4 g/dL (ref 3.5–5.2)
ALK PHOS: 67 U/L (ref 39–117)
ALT: 11 U/L (ref 0–35)
AST: 12 U/L (ref 0–37)
BILIRUBIN TOTAL: 0.3 mg/dL (ref 0.2–1.2)
BUN: 13 mg/dL (ref 6–23)
CO2: 30 mEq/L (ref 19–32)
Calcium: 8.9 mg/dL (ref 8.4–10.5)
Chloride: 109 mEq/L (ref 96–112)
Creatinine, Ser: 0.88 mg/dL (ref 0.40–1.20)
GFR: 85.63 mL/min (ref >60.00)
Glucose, Bld: 99 mg/dL (ref 70–99)
POTASSIUM: 3.8 meq/L (ref 3.5–5.1)
Sodium: 144 mEq/L (ref 135–145)
TOTAL PROTEIN: 6.5 g/dL (ref 6.0–8.3)

## 2017-06-03 LAB — CBC
HCT: 42.3 % (ref 36.0–46.0)
HEMOGLOBIN: 14.2 g/dL (ref 12.0–15.0)
MCHC: 33.6 g/dL (ref 30.0–36.0)
MCV: 92 fl (ref 78.0–100.0)
PLATELETS: 208 10*3/uL (ref 150.0–400.0)
RBC: 4.6 Mil/uL (ref 3.87–5.11)
RDW: 13.1 % (ref 11.5–15.5)
WBC: 8.9 10*3/uL (ref 4.0–10.5)

## 2017-06-03 LAB — MAGNESIUM: Magnesium: 2 mg/dL (ref 1.5–2.5)

## 2017-06-03 LAB — VITAMIN B12: Vitamin B-12: 431 pg/mL (ref 211–911)

## 2017-06-03 LAB — SEDIMENTATION RATE: SED RATE: 1 mm/h (ref 0–30)

## 2017-06-03 LAB — TSH: TSH: 1.42 u[IU]/mL (ref 0.35–4.50)

## 2017-06-03 LAB — CK: CK TOTAL: 191 U/L — AB (ref 7–177)

## 2017-06-03 MED ORDER — AMLODIPINE BESYLATE 5 MG PO TABS: 5.0000 mg | ORAL_TABLET | Freq: Every day | ORAL | 1 refills | Status: DC

## 2017-06-03 MED ORDER — NEBIVOLOL HCL 20 MG PO TABS: 20.0000 mg | ORAL_TABLET | Freq: Every day | ORAL | 1 refills | Status: DC

## 2017-06-03 MED ORDER — FLUTICASONE PROPIONATE 50 MCG/ACT NA SUSP: 2.0000 | Freq: Every day | NASAL | 6 refills | Status: DC

## 2017-06-03 MED ORDER — BUPROPION HCL ER (SR) 150 MG PO TB12: ORAL_TABLET | ORAL | 3 refills | Status: DC

## 2017-06-03 MED ORDER — OMEPRAZOLE 20 MG PO CPDR: DELAYED_RELEASE_CAPSULE | ORAL | 0 refills | Status: DC

## 2017-06-03 MED ORDER — CETIRIZINE HCL 10 MG PO TABS: 10.0000 mg | ORAL_TABLET | Freq: Every day | ORAL | 11 refills | Status: DC

## 2017-06-03 MED ORDER — IBUPROFEN-FAMOTIDINE 800-26.6 MG PO TABS: 1.0000 | ORAL_TABLET | Freq: Three times a day (TID) | ORAL | 0 refills | Status: DC | PRN

## 2017-06-03 NOTE — Progress Notes (Signed)
Subjective:    Patient ID: Jennifer Waters, female    DOB: Jul 04, 1960, 56 y.o.   MRN: [id number]  HPI Jennifer Waters is a 56 yo female who presents today to establish care. She s transferring to me from another provider in the same clinic.  Hypertension- maintained on bystolic 12OF-1.8 tabs daily History of poor blood pressure control with multiple adjustments in past year. She did not take her medication this morning but normally says she is complaint on a daily basis. Does not check bp regulary at home. 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 She has tried to cut her caffine intake which has reduced her bp some. She is a current heavy smoker and would like to quit. She smokes all day while she talks to customers on the phone, which she says helps deal with the stress of her job. She says she feels a headache when her blood pressure is up but she does have a history of migraines also and its hard to tell what type of headache she is having. She is also maintained on depakote and neurontin for her headaches. Shes also felt some sharp aching in the left side of her chest and mild shortness of breath recently. She also says she has daily aches and pains all over her body, which prevents her from exercising. shes been taking duexis prn for her body pain and arthritis which does help her pain. She denies syncope, weakness, vision changes, nausea, vomiting, palpitations, edema.   BP Readings from Last 3 Encounters:  06/03/17 (!) 160/98  02/25/17 (!) 148/90  01/14/17 (!) 170/100    Depression- miantained on effexor xr 75 daily, wellbutrin SR 150 BID She did run out of her wellbutrin recently. She works from home and says she Doesn't feel like leaving the house. She does enjoy spending time with her ex-husband, who is taking her on a trip to the mountains this weekend. Shes had thoughts that she would be better off dead, but denies these thoughts  today. She denies any active plans to hurt herself or hurt others.   Depression screen PHQ 2/9 06/03/2017  Decreased Interest 2  Down, Depressed, Hopeless 3  PHQ - 2 Score 5  Altered sleeping 3  Tired, decreased energy 3  Change in appetite 3  Feeling bad or failure about yourself  3  Trouble concentrating 1  Moving slowly or fidgety/restless 0  Suicidal thoughts 1  PHQ-9 Score 19   GAD 7 : Generalized Anxiety Score 06/03/2017  Nervous, Anxious, on Edge 3  Control/stop worrying 3  Worry too much - different things 3  Trouble relaxing 3  Restless 0  Easily annoyed or irritable 0  Afraid - awful might happen 1  Total GAD 7 Score 13      Review of Systems  See HPI  Past Medical History:  Diagnosis Date  . Arthritis   . Hypertension   . Migraines      Social History   Socioeconomic History  . Marital status: Divorced    Spouse name: Not on file  . Number of children: 1  . Years of education: 64  . Highest education level: Not on file  Social Needs  . Financial resource strain: Not on file  . Food insecurity - worry: Not on file  . Food insecurity - inability: Not on file  . Transportation needs - medical: Not on file  . Transportation needs - non-medical: Not  on file  Occupational History  . Occupation: Librarian, academic  Tobacco Use  . Smoking status: Current Every Day Smoker    Packs/day: 0.50    Years: 20.00    Pack years: 10.00    Types: Cigarettes  . Smokeless tobacco: Never Used  Substance and Sexual Activity  . Alcohol use: Yes    Alcohol/week: 3.0 oz    Types: 5 Glasses of wine per week  . Drug use: No  . Sexual activity: Yes    Birth control/protection: Surgical  Other Topics Concern  . Not on file  Social History Narrative   Fun: Puzzles, cats   Denies any religious beliefs effecting health care. Lives with daughter in a one story home.  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  Education: some college.                                                                 +    Past Surgical History:  Procedure Laterality Date  . ABDOMINAL HYSTERECTOMY      Family History  Problem Relation Age of Onset  . Arthritis Mother   . Ovarian cancer Mother   . Migraines Mother     No Known Allergies  Current Outpatient Medications on File Prior to Visit  Medication Sig Dispense Refill  . cyclobenzaprine (FLEXERIL) 5 MG tablet Take 1-2 tablets (5-10 mg total) by mouth at bedtime. 60 tablet 0  . divalproex (DEPAKOTE) 500 MG DR tablet Take 500 mg by mouth 2 (two) times daily.     Marland Kitchen gabapentin (NEURONTIN) 300 MG capsule TAKE 1 CAPSULE BY MOUTH 3 TIMES DAILY 90 capsule 0  . valACYclovir (VALTREX) 500 MG tablet Take 1 tablet (500 mg total) by mouth 2 (two) times daily. 6 tablet 3  . venlafaxine XR (EFFEXOR-XR) 75 MG 24 hr capsule Take 1 capsule (75 mg total) by mouth 2 (two) times daily. Must keep appt w/new provider for future refills 180 capsule 0  . albuterol (PROVENTIL HFA;VENTOLIN HFA) 108 (90 Base) MCG/ACT inhaler Inhale 2 puffs into the lungs every 6 (six) hours as needed for wheezing or shortness of breath. (Patient not taking: Reported on 06/03/2017) 1 Inhaler 0   No current facility-administered medications on file prior to visit.     BP (!) 160/98 (BP Location: Left Arm, Patient Position: Sitting, Cuff Size: Large)   Pulse 90   Temp 99.1 F (37.3 C) (Oral)   Resp 16   Ht '5\' 9"'  (8.372 m)   Wt 234 lb (106.1 kg)   SpO2 96%   BMI 34.56 kg/m      Objective:   Physical Exam  Constitutional: She is oriented to person, place, and time. She appears well-developed and well-nourished. No distress.  HENT:  Head: Normocephalic and atraumatic.  Cardiovascular: Normal rate, regular rhythm, normal heart sounds and intact distal pulses.  Pulmonary/Chest: Effort normal and breath sounds normal. No respiratory distress.  Musculoskeletal: Normal range of motion. She exhibits no edema.  Neurological: She is alert and oriented  to person, place, and time. Coordination normal.  Skin: Skin is warm and dry.  Psychiatric: She has a normal mood and affect. Judgment and thought content normal.  Vitals reviewed.     Assessment & Plan:  RTC in 2 weeks  for BP recheck. We can decide if BP is stable enough at that time to add chantix and see how her mood is after resuming wellbutrin.  Need for Tdap vaccination - Tdap vaccine greater than or equal to 7yo IM  Chest pain, unspecified type This could be related to her uncontrolled blood pressure. Diagnostic Tests: - EKG 12-Lead I personally reviewed the patients EKG today: sinus rhythym, abnormal EKG Referrals: - Ambulatory referral to Cardiology We are going to adjust BP medications to work on better blood pressure control.  Myalgia Diagnostic testing ordered: - TSH; Future - Magnesium; Future - B12; Future - Antinuclear Antib (ANA); Future - CK (Creatine Kinase); Future - Rheumatoid Factor; Future - Sed Rate (ESR); Future  Osteoarthritis of multiple joints, unspecified osteoarthritis type Medications ordered: - Ibuprofen-Famotidine (DUEXIS) 800-26.6 MG TABS; Take 1 tablet by mouth 3 (three) times daily as needed.  Dispense: 90 tablet; Refill: 0 We discussed using lowest dosage for shortest duration of time necessary to provide relief.  Cigarette nicotine dependence with nicotine-induced disorder We discussed the need to quit smoking for reduction of blood pressure and to improve overall health status. She is interested in chantix. We need to get her blood pressure stable first. Will consider at next visit.

## 2017-06-03 NOTE — Patient Instructions (Addendum)
Please head downstairs for lab work.  Please resume your wellbutrin-start with 1 tablet daily for three days then increase to 1 tablet twice daily.  Please decrease your bystolic to 20 mg once daily. Please begin amlodipine 5 mg daily. These medications are both for your blood pressure, please take them both daily. Please try to check your blood pressure once daily or at least a few times a week, at the same time each day, and keep a log. Id like to see you back in about 2 weeks, with your blood pressure log, to see how you are doing with medication adjustments.  We are going to work on your depression and smoking cessation next, lets try to get your blood pressure under better control for our first goal.  It was nice to meet you. Thanks for letting me take care of you today :)

## 2017-06-05 NOTE — Assessment & Plan Note (Signed)
Medications ordered: Decrease bystolic to 20mg  once daily due to no improvement of BP with high dosage of bystolic Begin amlodipine. - Nebivolol HCl (BYSTOLIC) 20 MG TABS; Take 1 tablet (20 mg total) by mouth daily.  Dispense: 30 tablet; Refill: 1 - amLODipine (NORVASC) 5 MG tablet; Take 1 tablet (5 mg total) by mouth daily.  Dispense: 30 tablet; Refill: 1 Diagnostic testing ordered: - CBC; Future - Comprehensive metabolic panel; Future - TSH; Future - Ambulatory referral to Cardiology  RTC in 2 weeks for BP/medicaiton follow up

## 2017-06-05 NOTE — Assessment & Plan Note (Signed)
-   omeprazole (PRILOSEC) 20 MG capsule; TAKE 1 CAPSULE BY MOUTH EVERY DAY  Dispense: 90 capsule; Refill: 0

## 2017-06-05 NOTE — Assessment & Plan Note (Signed)
Continue effexor. Resume wellbutrin - buPROPion (WELLBUTRIN SR) 150 MG 12 hr tablet; TAKE 1 TABLET BY MOUTH DAILY FOR 3 DAYS, THEN 1 TABLET TWICE DAILY  Dispense: 60 tablet; Refill: 3 RTC in 2 weeks for follow up.

## 2017-06-06 LAB — ANA: ANA: POSITIVE — AB

## 2017-06-06 LAB — RHEUMATOID FACTOR

## 2017-06-06 LAB — ANTI-NUCLEAR AB-TITER (ANA TITER): ANA Titer 1: 1:640 {titer} — ABNORMAL HIGH

## 2017-06-08 ENCOUNTER — Other Ambulatory Visit: Payer: Self-pay | Admitting: Nurse Practitioner

## 2017-06-08 DIAGNOSIS — R768 Other specified abnormal immunological findings in serum: Secondary | ICD-10-CM

## 2017-06-08 DIAGNOSIS — R7689 Other specified abnormal immunological findings in serum: Secondary | ICD-10-CM

## 2017-06-08 NOTE — Progress Notes (Signed)
orders

## 2017-06-22 ENCOUNTER — Telehealth: Payer: Self-pay | Admitting: Nurse Practitioner

## 2017-06-22 DIAGNOSIS — I10 Essential (primary) hypertension: Secondary | ICD-10-CM

## 2017-06-22 MED ORDER — AMLODIPINE BESYLATE 5 MG PO TABS: 5.0000 mg | ORAL_TABLET | Freq: Every day | ORAL | 0 refills | Status: DC

## 2017-06-22 MED ORDER — NEBIVOLOL HCL 20 MG PO TABS: 20.0000 mg | ORAL_TABLET | Freq: Every day | ORAL | 0 refills | Status: DC

## 2017-06-22 NOTE — Telephone Encounter (Signed)
Copied from [place]. Topic: General - Other >> Jun 22, 2017 10:22 AM Neva Seat wrote: Pt called regarding all current medications that were attempted to be filled.  There is an issue with the pharmacy where the refill was requested.  Please call pt back to discuss.

## 2017-06-22 NOTE — Telephone Encounter (Signed)
Pt mail order pharmacy changed from [place] to [place]. Pt called and is out of amlodipine and bystolic  Filled for 5 days each until can get meds from [place]. Pt wants partial refill sent to [facility] [address]

## 2017-06-23 ENCOUNTER — Ambulatory Visit (INDEPENDENT_AMBULATORY_CARE_PROVIDER_SITE_OTHER): Payer: BLUE CROSS/BLUE SHIELD | Admitting: Nurse Practitioner

## 2017-06-23 ENCOUNTER — Encounter: Payer: Self-pay | Admitting: Nurse Practitioner

## 2017-06-23 VITALS — BP 164/102 | HR 82 | Temp 98.9°F | Resp 16 | Ht 69.0 in | Wt 232.0 lb

## 2017-06-23 DIAGNOSIS — I10 Essential (primary) hypertension: Secondary | ICD-10-CM

## 2017-06-23 DIAGNOSIS — F331 Major depressive disorder, recurrent, moderate: Secondary | ICD-10-CM

## 2017-06-23 DIAGNOSIS — K219 Gastro-esophageal reflux disease without esophagitis: Secondary | ICD-10-CM

## 2017-06-23 MED ORDER — FLUTICASONE PROPIONATE 50 MCG/ACT NA SUSP: 2.0000 | Freq: Every day | NASAL | 6 refills | Status: DC

## 2017-06-23 MED ORDER — OMEPRAZOLE 20 MG PO CPDR: DELAYED_RELEASE_CAPSULE | ORAL | 0 refills | Status: AC

## 2017-06-23 MED ORDER — AMLODIPINE BESYLATE 5 MG PO TABS: 5.0000 mg | ORAL_TABLET | Freq: Every day | ORAL | 0 refills | Status: DC

## 2017-06-23 MED ORDER — NEBIVOLOL HCL 20 MG PO TABS: 20.0000 mg | ORAL_TABLET | Freq: Every day | ORAL | 0 refills | Status: DC

## 2017-06-23 MED ORDER — VENLAFAXINE HCL ER 75 MG PO CP24: 75.0000 mg | ORAL_CAPSULE | Freq: Two times a day (BID) | ORAL | 0 refills | Status: DC

## 2017-06-23 MED ORDER — BUPROPION HCL ER (SR) 150 MG PO TB12: ORAL_TABLET | ORAL | 3 refills | Status: DC

## 2017-06-23 MED ORDER — CETIRIZINE HCL 10 MG PO TABS: 10.0000 mg | ORAL_TABLET | Freq: Every day | ORAL | 0 refills | Status: DC

## 2017-06-23 NOTE — Patient Instructions (Addendum)
Please continue to take your amlodipine 5 mg daily and restart your bystolic 20 mg daily.  Please continue your effexor and restart your wellbutrin daily.  Please return in 2 weeks so we can see if your blood pressure is improving.  It was good to see you. Thanks for letting me take care of you today :)

## 2017-06-23 NOTE — Progress Notes (Signed)
Subjective:    Patient ID: Jennifer Waters, female    DOB: [birth year], 58 y.o.   MRN: [id number]  HPI Jennifer Waters presents today for a follow up visit of high blood pressure and depression  Hypertension - She was seen 2 weeks ago and had elevated readings. She was instructed to decrease bystolic to 20 daily, begin amlodipine 5 daily. She was unable to get the amlodipine until today and ran out of her bystolic about 2 days ago due to change of her medical insurances prescription plan. She plans to resume both medications today. Denies headaches, vision changes, chest pain, shortness of breath, edema.  BP Readings from Last 3 Encounters:  06/23/17 (!) 164/102  06/03/17 (!) 160/98  02/25/17 (!) 148/90   Depression- maintained on effexor 75 BID. She was seen 2 weeks ago and had been off of her wellbutrin due to no refills. She was instructed to continue effexor and resume wellbutrin., She was unable to fill wellbutrin due to change of her medical insurances prescription plan. She says she will be able to get her wellbutrin today. She feels like her mood is about the same. She deneis thoughts of hurting herself or others   Review of Systems  See HPI  Past Medical History:  Diagnosis Date  . Arthritis   . Hypertension   . Migraines      Social History   Socioeconomic History  . Marital status: Divorced    Spouse name: Not on file  . Number of children: 1  . Years of education: 23  . Highest education level: Not on file  Social Needs  . Financial resource strain: Not on file  . Food insecurity - worry: Not on file  . Food insecurity - inability: Not on file  . Transportation needs - medical: Not on file  . Transportation needs - non-medical: Not on file  Occupational History  . Occupation: Librarian, academic  Tobacco Use  . Smoking status: Current Every Day Smoker    Packs/day: 0.50    Years: 20.00    Pack years: 10.00    Types: Cigarettes  . Smokeless tobacco: Never  Used  Substance and Sexual Activity  . Alcohol use: Yes    Alcohol/week: 3.0 oz    Types: 5 Glasses of wine per week  . Drug use: No  . Sexual activity: Yes    Birth control/protection: Surgical  Other Topics Concern  . Not on file  Social History Narrative   Fun: Puzzles, cats   Denies any religious beliefs effecting health care. Lives with daughter in a one story home.  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  Education: some college.                                                               +    Past Surgical History:  Procedure Laterality Date  . ABDOMINAL HYSTERECTOMY      Family History  Problem Relation Age of Onset  . Arthritis Mother   . Ovarian cancer Mother   . Migraines Mother     No Known Allergies  Current Outpatient Medications on File Prior to Visit  Medication Sig Dispense Refill  . albuterol (PROVENTIL HFA;VENTOLIN HFA) 108 (90 Base) MCG/ACT inhaler Inhale 2 puffs into the lungs  every 6 (six) hours as needed for wheezing or shortness of breath. (Patient not taking: Reported on 06/03/2017) 1 Inhaler 0  . amLODipine (NORVASC) 5 MG tablet Take 1 tablet (5 mg total) by mouth daily. 5 tablet 0  . buPROPion (WELLBUTRIN SR) 150 MG 12 hr tablet TAKE 1 TABLET BY MOUTH DAILY FOR 3 DAYS, THEN 1 TABLET TWICE DAILY 60 tablet 3  . cetirizine (ZYRTEC) 10 MG tablet Take 1 tablet (10 mg total) by mouth daily. 30 tablet 11  . cyclobenzaprine (FLEXERIL) 5 MG tablet Take 1-2 tablets (5-10 mg total) by mouth at bedtime. 60 tablet 0  . divalproex (DEPAKOTE) 500 MG DR tablet Take 500 mg by mouth 2 (two) times daily.     . fluticasone (FLONASE) 50 MCG/ACT nasal spray Place 2 sprays into both nostrils daily. 16 g 6  . gabapentin (NEURONTIN) 300 MG capsule TAKE 1 CAPSULE BY MOUTH 3 TIMES DAILY 90 capsule 0  . Ibuprofen-Famotidine (DUEXIS) 800-26.6 MG TABS Take 1 tablet by mouth 3 (three) times daily as needed. 90 tablet 0  . Nebivolol HCl (BYSTOLIC) 20 MG TABS Take 1  tablet (20 mg total) by mouth daily. 5 tablet 0  . omeprazole (PRILOSEC) 20 MG capsule TAKE 1 CAPSULE BY MOUTH EVERY DAY 90 capsule 0  . valACYclovir (VALTREX) 500 MG tablet Take 1 tablet (500 mg total) by mouth 2 (two) times daily. 6 tablet 3  . venlafaxine XR (EFFEXOR-XR) 75 MG 24 hr capsule Take 1 capsule (75 mg total) by mouth 2 (two) times daily. Must keep appt w/new provider for future refills 180 capsule 0   No current facility-administered medications on file prior to visit.     BP (!) 164/102 (BP Location: Left Arm, Patient Position: Sitting, Cuff Size: Large)   Pulse 82   Temp 98.9 F (37.2 C) (Oral)   Resp 16   Ht 5\' 9"  (1.753 m)   Wt 232 lb (105.2 kg)   SpO2 97%   BMI 34.26 kg/m        Objective:   Physical Exam  Constitutional: She is oriented to person, place, and time. She appears well-developed and well-nourished. No distress.  HENT:  Head: Normocephalic and atraumatic.  Cardiovascular: Normal rate, regular rhythm, normal heart sounds and intact distal pulses.  Pulmonary/Chest: Effort normal and breath sounds normal.  Musculoskeletal: She exhibits no edema.  Neurological: She is alert and oriented to person, place, and time. Coordination normal.  Skin: Skin is warm and dry.  Psychiatric: She has a normal mood and affect. Judgment and thought content normal.      Assessment & Plan:   Gastroesophageal reflux disease without esophagitis - omeprazole (PRILOSEC) 20 MG capsule; TAKE 1 CAPSULE BY MOUTH EVERY DAY  Dispense: 90 capsule; Refill: 0

## 2017-06-23 NOTE — Assessment & Plan Note (Signed)
She will continue effexor 75 BID and restart wellbutrin. Medications ordered: - buPROPion (WELLBUTRIN SR) 150 MG 12 hr tablet; TAKE 1 TABLET BY MOUTH DAILY FOR 3 DAYS, THEN 1 TABLET TWICE DAILY  Dispense: 60 tablet; Refill: 3 Instructed to resume medications and return in 2 weeks for follow up. We had a long discussion about the difficulty of controlling her depression  if she Is unable to take her medications daily. She verbalizes understanding and says that she intends to get back on her medications now that her insurance has changed and she has access to new prescription plan. Shell return in 2 weeks for follow up so we can see how shes doing back on her medications.

## 2017-06-23 NOTE — Assessment & Plan Note (Signed)
Medications ordered: - amLODipine (NORVASC) 5 MG tablet; Take 1 tablet (5 mg total) by mouth daily.  Dispense: 90 tablet; Refill: 0 - Nebivolol HCl (BYSTOLIC) 20 MG TABS; Take 1 tablet (20 mg total) by mouth daily.  Dispense: 90 tablet; Refill: 0 Instructed to resume medications and return in 2 weeks for follow up. We had a long discussion about how we can not treat her hypertension if she Is unable to take her medications daily. She verbalizes understanding and says that she intends to get back on her medications now that her insurance has changed and she has access to new prescription plan. Shell return in 2 weeks for follow up so we can see how shes doing back on her medications.

## 2017-06-24 ENCOUNTER — Telehealth: Payer: Self-pay

## 2017-06-24 NOTE — Telephone Encounter (Signed)
LVM for pt to call back to give me info about medications for a from that was sent over from [facility]. Need to know if she is taking depakote.

## 2017-06-28 ENCOUNTER — Telehealth: Payer: Self-pay | Admitting: Nurse Practitioner

## 2017-06-28 NOTE — Telephone Encounter (Signed)
Spoke with patient and she stated that she has a 30 day supply left of depakote and she is taking this medication every day. States she is willing to try topamax and willing to get a referral to another neurologist which is who was helping with her headaches previously but her doctor retired. I did tell her that we would slowly decrease medication to get her off of it. Pt was understanding.

## 2017-06-28 NOTE — Telephone Encounter (Signed)
Please let her know that I can not continue to prescribe her depakote because this is a medication that needs continuous monitoring and lab work to maintain safely.  I would be glad to prescribe her an alternate daily preventive medication for her headaches, we could try topamax. AND/OR I can place a referral for her to the headache specialty clinic for further treatment of her headaches, they may decide to continue or adjust her current regimen. We should decrease the depakote slowly. How much is she currently taking and how much does she have left?

## 2017-06-30 ENCOUNTER — Other Ambulatory Visit: Payer: Self-pay | Admitting: Nurse Practitioner

## 2017-06-30 DIAGNOSIS — R519 Headache, unspecified: Secondary | ICD-10-CM

## 2017-06-30 DIAGNOSIS — R51 Headache: Principal | ICD-10-CM

## 2017-06-30 NOTE — Progress Notes (Signed)
Referral placed.

## 2017-06-30 NOTE — Telephone Encounter (Signed)
Referral to neurology placed. Why don't we keep her headache medications like they are while we are awaiting her neurology appointment, then we will let them decide if changes need to be made? I don't want her to have any headaches while we are waiting for her appointment with them, so I will just keep her on the depakote twice daily for now- if she needs a refill before she is able to get in with neurology please contact me for refill.

## 2017-07-08 ENCOUNTER — Encounter: Payer: Self-pay | Admitting: Family

## 2017-07-08 ENCOUNTER — Ambulatory Visit (INDEPENDENT_AMBULATORY_CARE_PROVIDER_SITE_OTHER): Payer: BLUE CROSS/BLUE SHIELD | Admitting: Family

## 2017-07-08 ENCOUNTER — Ambulatory Visit (INDEPENDENT_AMBULATORY_CARE_PROVIDER_SITE_OTHER)
Admission: RE | Admit: 2017-07-08 | Discharge: 2017-07-08 | Disposition: A | Discharge status: Home / Self Care | Payer: BLUE CROSS/BLUE SHIELD | Source: Ambulatory Visit | Attending: Family | Admitting: Family

## 2017-07-08 ENCOUNTER — Other Ambulatory Visit: Payer: Self-pay | Admitting: Family

## 2017-07-08 VITALS — BP 160/84 | HR 83 | Temp 97.3°F | Wt 234.0 lb

## 2017-07-08 DIAGNOSIS — G8929 Other chronic pain: Secondary | ICD-10-CM

## 2017-07-08 DIAGNOSIS — M542 Cervicalgia: Secondary | ICD-10-CM

## 2017-07-08 DIAGNOSIS — K219 Gastro-esophageal reflux disease without esophagitis: Secondary | ICD-10-CM | POA: Diagnosis not present

## 2017-07-08 DIAGNOSIS — F331 Major depressive disorder, recurrent, moderate: Secondary | ICD-10-CM

## 2017-07-08 DIAGNOSIS — Z720 Tobacco use: Secondary | ICD-10-CM | POA: Diagnosis not present

## 2017-07-08 DIAGNOSIS — I10 Essential (primary) hypertension: Secondary | ICD-10-CM

## 2017-07-08 MED ORDER — AMLODIPINE BESYLATE 10 MG PO TABS: 10.0000 mg | ORAL_TABLET | Freq: Every day | ORAL | 1 refills | Status: DC

## 2017-07-08 MED ORDER — GABAPENTIN 300 MG PO CAPS: 300.0000 mg | ORAL_CAPSULE | Freq: Three times a day (TID) | ORAL | 0 refills | Status: DC

## 2017-07-08 NOTE — Patient Instructions (Signed)
Increase your Amlodipine to 10 mg daily;  Taper off the Wellbutrin- cut back to once a day x 3 days; then every other day x 3 days and then stop; stay on your Effexor;      Steps to Quit Smoking Smoking tobacco can be bad for your health. It can also affect almost every organ in your body. Smoking puts you and people around you at risk for many serious long-lasting (chronic) diseases. Quitting smoking is hard, but it is one of the best things that you can do for your health. It is never too late to quit. What are the benefits of quitting smoking? When you quit smoking, you lower your risk for getting serious diseases and conditions. They can include:  Lung cancer or lung disease.  Heart disease.  Stroke.  Heart attack.  Not being able to have children (infertility).  Weak bones (osteoporosis) and broken bones (fractures).  If you have coughing, wheezing, and shortness of breath, those symptoms may get better when you quit. You may also get sick less often. If you are pregnant, quitting smoking can help to lower your chances of having a baby of low birth weight. What can I do to help me quit smoking? Talk with your doctor about what can help you quit smoking. Some things you can do (strategies) include:  Quitting smoking totally, instead of slowly cutting back how much you smoke over a period of time.  Going to in-person counseling. You are more likely to quit if you go to many counseling sessions.  Using resources and support systems, such as: ? Database administrator with a Social worker. ? Phone quitlines. ? Careers information officer. ? Support groups or group counseling. ? Text messaging programs. ? Mobile phone apps or applications.  Taking medicines. Some of these medicines may have nicotine in them. If you are pregnant or breastfeeding, do not take any medicines to quit smoking unless your doctor says it is okay. Talk with your doctor about counseling or other things that can help  you.  Talk with your doctor about using more than one strategy at the same time, such as taking medicines while you are also going to in-person counseling. This can help make quitting easier. What things can I do to make it easier to quit? Quitting smoking might feel very hard at first, but there is a lot that you can do to make it easier. Take these steps:  Talk to your family and friends. Ask them to support and encourage you.  Call phone quitlines, reach out to support groups, or work with a Social worker.  Ask people who smoke to not smoke around you.  Avoid places that make you want (trigger) to smoke, such as: ? Bars. ? Parties. ? Smoke-break areas at work.  Spend time with people who do not smoke.  Lower the stress in your life. Stress can make you want to smoke. Try these things to help your stress: ? Getting regular exercise. ? Deep-breathing exercises. ? Yoga. ? Meditating. ? Doing a body scan. To do this, close your eyes, focus on one area of your body at a time from head to toe, and notice which parts of your body are tense. Try to relax the muscles in those areas.  Download or buy apps on your mobile phone or tablet that can help you stick to your quit plan. There are many free apps, such as QuitGuide from the [employer] Office manager for Disease Control and Prevention). You can find more  support from smokefree.gov and other websites.  This information is not intended to replace advice given to you by your health care provider. Make sure you discuss any questions you have with your health care provider. Document Released: 04/03/2009 Document Revised: 02/03/2016 Document Reviewed: 10/22/2014 Elsevier Interactive Patient Education  2018 [employer].

## 2017-07-08 NOTE — Progress Notes (Signed)
Jennifer Waters is a 57 y.o. female with the following history as recorded in Epic[facility]:  Patient Active Problem List   Diagnosis Date Noted  . Tobacco abuse 07/08/2017  . Depression 11/22/2016  . Cervical disc disorder with radiculopathy of cervical region 05/04/2016  . Cough 03/15/2016  . Upper back pain 12/11/2015  . Snoring 12/11/2015  . GERD (gastroesophageal reflux disease) 12/11/2015  . Laryngitis 05/13/2015  . Allergic rhinitis 05/01/2015  . Osteoarthritis 10/17/2014  . Essential hypertension 10/17/2014    Current Outpatient Medications  Medication Sig Dispense Refill  . albuterol (PROVENTIL HFA;VENTOLIN HFA) 108 (90 Base) MCG/ACT inhaler Inhale 2 puffs into the lungs every 6 (six) hours as needed for wheezing or shortness of breath. 1 Inhaler 0  . cetirizine (ZYRTEC) 10 MG tablet Take 1 tablet (10 mg total) by mouth daily. 90 tablet 0  . fluticasone (FLONASE) 50 MCG/ACT nasal spray Place 2 sprays into both nostrils daily. 16 g 6  . gabapentin (NEURONTIN) 300 MG capsule Take 1 capsule (300 mg total) by mouth 3 (three) times daily. 90 capsule 0  . Ibuprofen-Famotidine (DUEXIS) 800-26.6 MG TABS Take 1 tablet by mouth 3 (three) times daily as needed. 90 tablet 0  . Nebivolol HCl (BYSTOLIC) 20 MG TABS Take 1 tablet (20 mg total) by mouth daily. 90 tablet 0  . omeprazole (PRILOSEC) 20 MG capsule TAKE 1 CAPSULE BY MOUTH EVERY DAY 90 capsule 0  . valACYclovir (VALTREX) 500 MG tablet Take 1 tablet (500 mg total) by mouth 2 (two) times daily. (Patient taking differently: Take 500 mg by mouth 2 (two) times daily. As needed outbreak) 6 tablet 3  . venlafaxine XR (EFFEXOR-XR) 75 MG 24 hr capsule Take 1 capsule (75 mg total) by mouth 2 (two) times daily. Must keep appt w/new provider for future refills 180 capsule 0  . amLODipine (NORVASC) 10 MG tablet Take 1 tablet (10 mg total) by mouth daily. 30 tablet 1   No current facility-administered medications for this visit.     Allergies:  Patient has no known allergies.  Past Medical History:  Diagnosis Date  . Arthritis   . Hypertension   . Migraines     Past Surgical History:  Procedure Laterality Date  . ABDOMINAL HYSTERECTOMY      Family History  Problem Relation Age of Onset  . Arthritis Mother   . Ovarian cancer Mother   . Migraines Mother     Social History   Tobacco Use  . Smoking status: Current Every Day Smoker    Packs/day: 0.50    Years: 20.00    Pack years: 10.00    Types: Cigarettes  . Smokeless tobacco: Never Used  Substance Use Topics  . Alcohol use: Yes    Alcohol/week: 3.0 oz    Types: 5 Glasses of wine per week    Subjective:  2 week follow-up on hypertension; at last visit, Amlodipine 5 mg was added to her Bystolic 20 mg; tolerating well; Denies any chest pain, shortness of breath, blurred vision or headache; does have home cuff but not checking regularly; notes that she checked it last weekend and thinks top number was still around 148-150;   Smokes at least 1ppd; does express interest in quitting; has been on combination of Wellbutrin SR 150 bid and Effexor XR 150 daily on and off for at least a year; prefers to stay on Effexor- finds this beneficial for her depression/ anxiety/ stress related to her job;   Notes problems with pain  radiating from her neck into both her fingertips; had X-ray done in 2017; was prescribed Gabapentin at that time to help with pain; has recently re-started the Gabapentin; in reviewing old notes, was referred to PT in 2017 but patient does not think she ever went;    Objective:  Vitals:   07/08/17 0924  BP: (!) 160/84  Pulse: 83  Temp: (!) 97.3 F (36.3 C)  TempSrc: Oral  SpO2: 97%  Weight: 234 lb (106.1 kg)    General: Well developed, well nourished, in no acute distress  Skin : Warm and dry.  Head: Normocephalic and atraumatic  Eyes: Sclera and conjunctiva clear; pupils round and reactive to light; extraocular movements intact  Ears: External  normal; canals clear; tympanic membranes normal  Oropharynx: Pink, supple. No suspicious lesions  Neck: Supple without thyromegaly, adenopathy  Lungs: Respirations unlabored; clear to auscultation bilaterally without wheeze, rales, rhonchi  CVS exam: normal rate and regular rhythm.  Abdomen: Soft; nontender; nondistended; normoactive bowel sounds; no masses or hepatosplenomegaly  Musculoskeletal: No deformities; no active joint inflammation  Extremities: No edema, cyanosis, clubbing  Vessels: Symmetric bilaterally  Neurologic: Alert and oriented; speech intact; face symmetrical; moves all extremities well; CNII-XII intact without focal deficit  Assessment:  1. Essential hypertension   2. Chronic neck pain   3. Gastroesophageal reflux disease, esophagitis presence not specified   4. Tobacco abuse   5. Moderate episode of recurrent major depressive disorder ([place])     Plan:  1. Uncontrolled; Continue Bystolic 20 mg and increase Amlodipine to 10 mg daily; bring home cuff to next OV; follow-up in 1 month;   2. Update cervical X-ray; okay to continue Gabapentin; will most likely need to consider MRI, PT, referral to specialist; 3. Stable on Omeprazole 20 mg daily; 4. Will stop the Wellbutrin due to risks of interaction with her Effexor; she is in agreement and taper discussed; literature provided; will consider starting Chantix once blood pressure control obtained; 5. Stable on Effexor XR; continue this medication;   Return in about 1 month (around 08/08/2017) for with Ashleigh.  Orders Placed This Encounter  Procedures  . DG Cervical Spine 2 or 3 views    Standing Status:   Future    Number of Occurrences:   1    Standing Expiration Date:   09/06/2018    Order Specific Question:   Reason for Exam (SYMPTOM  OR DIAGNOSIS REQUIRED)    Answer:   chronic neck pain    Order Specific Question:   Is patient pregnant?    Answer:   No    Order Specific Question:   Preferred imaging location?     Answer:   [facility]    Order Specific Question:   Radiology Contrast Protocol - do NOT remove file path    Answer:   [email or web address]    Requested Prescriptions   Signed Prescriptions Disp Refills  . amLODipine (NORVASC) 10 MG tablet 30 tablet 1    Sig: Take 1 tablet (10 mg total) by mouth daily.  Marland Kitchen gabapentin (NEURONTIN) 300 MG capsule 90 capsule 0    Sig: Take 1 capsule (300 mg total) by mouth 3 (three) times daily.

## 2017-07-18 ENCOUNTER — Encounter: Payer: Self-pay | Admitting: Nurse Practitioner

## 2017-07-25 ENCOUNTER — Encounter: Payer: Self-pay | Admitting: Cardiology

## 2017-07-25 ENCOUNTER — Ambulatory Visit (INDEPENDENT_AMBULATORY_CARE_PROVIDER_SITE_OTHER): Payer: BLUE CROSS/BLUE SHIELD | Admitting: Cardiology

## 2017-07-25 VITALS — BP 124/76 | HR 74 | Ht 69.0 in | Wt 237.2 lb

## 2017-07-25 DIAGNOSIS — I10 Essential (primary) hypertension: Secondary | ICD-10-CM

## 2017-07-25 DIAGNOSIS — Z720 Tobacco use: Secondary | ICD-10-CM

## 2017-07-25 DIAGNOSIS — R0609 Other forms of dyspnea: Secondary | ICD-10-CM | POA: Diagnosis not present

## 2017-07-25 DIAGNOSIS — R079 Chest pain, unspecified: Secondary | ICD-10-CM

## 2017-07-25 DIAGNOSIS — R0600 Dyspnea, unspecified: Secondary | ICD-10-CM | POA: Insufficient documentation

## 2017-07-25 NOTE — Progress Notes (Signed)
PCP: Lance Sell, NP  Clinic Note: Chief Complaint  Patient presents with  . New Patient (Initial Visit)    Sharp chest pain, burning.  Marland Kitchen Hypertension    HPI: UNNAMED Jennifer Waters is a 57 y.o. female with a PMH notable for hypertension below who presents today for chest pain.  I at the request of Lance Sell, NP.  JEFFERY GAMMELL was last seen on July 08, 2017 by Jodi Mourning, FNP is a standing for Lance Sell, NP  Noted uncontrolled hypertension.  Increased amlodipine to 10 mg in addition to Bystolic 20 mg.  Recent Hospitalizations: None  Studies Personally Reviewed - (if available, images/films reviewed: From Epic Chart or Care Everywhere)  None  Interval History: Jennifer Waters is an otherwise relatively healthy woman with obviously difficult to control hypertension and a sister who has a history of pacemaker otherwise no significant cardiac history in the family.  She is a current smoker and does note having some exertional dyspnea.  She is troubled by chronic migraines.  She is moderately obese and relatively sedentary without any significant exercise. She has chronic GERD symptoms.  Today she notes having some discomfort that is often under the left breast and then occasionally up along the left sternal border.  It is usually worse with bending over or after sitting for a while.  Not with exertion.  It is usually alleviated with a deep breath or stretching.  She does not have any exertional chest pain or pressure. Although having some exertional dyspnea, she denies any PND, orthopnea or edema.  Now that her blood pressure is better controlled, she is noting improvement in her dyspnea.  She denies any significant rapid irregular heartbeats or palpitations.   she does note a few intermittent skipped beats, but nothing prolonged.  Nothing worrisome to her.   No headaches or blurred vision associated with hypertension.   No lightheadedness, dizziness,  weakness, syncope/near syncope or, TIA/amaurosis fugax symptoms. No claudication.  ROS: A comprehensive was performed. Review of Systems  Constitutional: Negative for malaise/fatigue (Just deconditioning.  Does not exercise routinely) and weight loss.  HENT: Negative for congestion, nosebleeds and sinus pain.   Respiratory: Positive for cough (Occasionally, nonproductive), shortness of breath and wheezing (Also occasionally).   Cardiovascular: Positive for chest pain (Per HPI).  Gastrointestinal: Positive for heartburn. Negative for abdominal pain, blood in stool, diarrhea, melena and nausea.  Genitourinary: Negative for hematuria.  Musculoskeletal: Positive for joint pain (Mild arthritis pains).  Neurological: Positive for headaches (Chronic daily headaches.  Some migraine, some other). Negative for seizures (No recurrent seizures).  All other systems reviewed and are negative.   I have reviewed and (if needed) personally updated the patient's problem list, medications, allergies, past medical and surgical history, social and family history.   Past Medical History:  Diagnosis Date  . Arthritis   . Hypertension   . Migraines     Past Surgical History:  Procedure Laterality Date  . ABDOMINAL HYSTERECTOMY      Current Meds  Medication Sig  . albuterol (PROVENTIL HFA;VENTOLIN HFA) 108 (90 Base) MCG/ACT inhaler Inhale 2 puffs into the lungs every 6 (six) hours as needed for wheezing or shortness of breath.  Marland Kitchen amLODipine (NORVASC) 10 MG tablet Take 1 tablet (10 mg total) by mouth daily.  . cetirizine (ZYRTEC) 10 MG tablet Take 1 tablet (10 mg total) by mouth daily.  . fluticasone (FLONASE) 50 MCG/ACT nasal spray Place 2 sprays into both nostrils daily.  Marland Kitchen  gabapentin (NEURONTIN) 300 MG capsule Take 1 capsule (300 mg total) by mouth 3 (three) times daily.  . Ibuprofen-Famotidine (DUEXIS) 800-26.6 MG TABS Take 1 tablet by mouth 3 (three) times daily as needed.  . Nebivolol HCl  (BYSTOLIC) 20 MG TABS Take 1 tablet (20 mg total) by mouth daily.  Marland Kitchen omeprazole (PRILOSEC) 20 MG capsule TAKE 1 CAPSULE BY MOUTH EVERY DAY  . valACYclovir (VALTREX) 500 MG tablet Take 1 tablet (500 mg total) by mouth 2 (two) times daily. (Patient taking differently: Take 500 mg by mouth 2 (two) times daily. As needed outbreak)  . venlafaxine XR (EFFEXOR-XR) 75 MG 24 hr capsule Take 1 capsule (75 mg total) by mouth 2 (two) times daily. Must keep appt w/new provider for future refills    No Known Allergies  Social History   Tobacco Use  . Smoking status: Current Every Day Smoker    Packs/day: 0.50    Years: 20.00    Pack years: 10.00    Types: Cigarettes  . Smokeless tobacco: Never Used  Substance Use Topics  . Alcohol use: Yes    Alcohol/week: 3.0 oz    Types: 5 Glasses of wine per week  . Drug use: No   Social History   Social History Narrative   Fun: Puzzles, cats   Denies any religious beliefs effecting health care. Lives with daughter in a one story home.  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  Education: some college.    family history includes Arrhythmia in her sister; Arthritis in her mother and sister; Brain cancer in her brother; Diabetes in her brother; Edema in her sister; Lung cancer in her father; Migraines in her mother; Ovarian cancer in her mother.  Wt Readings from Last 3 Encounters:  07/25/17 237 lb 3.2 oz (107.6 kg)  07/08/17 234 lb (106.1 kg)  06/23/17 232 lb (105.2 kg)    PHYSICAL EXAM BP 124/76   Pulse 74   Ht 5\' 9"  (1.753 m)   Wt 237 lb 3.2 oz (107.6 kg)   BMI 35.03 kg/m  Physical Exam  Constitutional: She is oriented to person, place, and time. She appears well-developed and well-nourished. No distress.  Moderately obese  HENT:  Head: Normocephalic and atraumatic.  Mouth/Throat: No oropharyngeal exudate.  Eyes: Conjunctivae and EOM are normal. Pupils are equal, round, and reactive to light. No scleral icterus.  Neck: Normal range of motion. Neck  supple. No hepatojugular reflux and no JVD present. Carotid bruit is not present.  Cardiovascular: Normal rate, regular rhythm, S1 normal, S2 normal and normal pulses.  No extrasystoles are present. PMI is not displaced. Exam reveals distant heart sounds. Exam reveals no gallop and no friction rub.  No murmur heard. Pulmonary/Chest: Effort normal. No respiratory distress. She has no wheezes. She has no rales. She exhibits tenderness (Along the sternal border on both sides.).  Somewhat distant breath sounds, but no obvious rales or rhonchi.  Mild interstitial sounds  Abdominal: Soft. Bowel sounds are normal. She exhibits no distension. There is no tenderness. There is no rebound.  Musculoskeletal: Normal range of motion. She exhibits deformity. She exhibits no edema.  Neurological: She is alert and oriented to person, place, and time. No cranial nerve deficit.  Skin: Skin is warm and dry. No rash noted. No erythema.  Psychiatric: She has a normal mood and affect. Her behavior is normal. Judgment and thought content normal.     Adult ECG Report  Rate: 74;  Rhythm: normal sinus rhythm and Normal  axis, intervals and durations.  Nonspecific ST-T wave changes.;   Narrative Interpretation: Relatively normal EKG   Other studies Reviewed: Additional studies/ records that were reviewed today include:  Recent Labs:    Lab Results  Component Value Date   CREATININE 0.88 06/03/2017   BUN 13 06/03/2017   NA 144 06/03/2017   K 3.8 06/03/2017   CL 109 06/03/2017   CO2 30 06/03/2017   Lab Results  Component Value Date   WBC 8.9 06/03/2017   HGB 14.2 06/03/2017   HCT 42.3 06/03/2017   MCV 92.0 06/03/2017   PLT 208.0 06/03/2017   No results found for: CHOL, HDL, LDLCALC, LDLDIRECT, TRIG, CHOLHDL  ASSESSMENT / PLAN: Problem List Items Addressed This Visit    Chest pain with low risk for cardiac etiology    Chest pain she describing sounds more musculoskeletal.  Not like her GERD pain, but it is  worse with bending over or sitting and better with stretching.  Probably related to abdominal/chest wall discomfort.  I would not consider stress testing for this type of chest pain.      Relevant Orders   EKG 12-Lead (Completed)   ECHOCARDIOGRAM COMPLETE   DOE (dyspnea on exertion) (Chronic)    Probably related to deconditioning, smoking, obesity, etc.  However given her difficult to control hypertension we will check 2D according to exclude hypertensive heart disease.      Relevant Orders   EKG 12-Lead (Completed)   ECHOCARDIOGRAM COMPLETE   Essential hypertension - Primary (Chronic)    Well-controlled today on increased dose of Norvasc/amlodipine at 10 mg along with 20 mg Bystolic. Would probably be to consider a diuretic such as HCTZ or chlorthalidone, but currently her blood pressure looks great.  I will have her follow-up in our hypertension clinic in about a month to reassess.  We will check 2D echocardiogram given her dyspnea to assess signs of hypertensive heart disease. We will also contact her about checking renal artery Doppler.      Tobacco abuse (Chronic)    Clearly a risk factor for both her dyspnea and potential cardiac issues.  We talked about her needing to consider quitting, but she  was not ready to discuss this at this time.         Current medicines are reviewed at length with the patient today. (+/- concerns) none The following changes have been made:None  Patient Instructions  Medication instructions  No changes     Keep blood pressure log and bring with you to appointment with CV[facility] Your physician recommends that you schedule a follow-up appointment in [place] AT [place] Your physician has requested that you have an echocardiogram. Echocardiography is a painless test that uses sound waves to create images of your heart. It provides your doctor with information about the size  and shape of your heart and how well your heart's chambers and valves are working. This procedure takes approximately one hour. There are no restrictions for this procedure.    Your physician recommends that you schedule a follow-up appointment in [address] [place].        Studies Ordered:   Orders Placed This Encounter  Procedures  . EKG 12-Lead  . ECHOCARDIOGRAM COMPLETE      Glenetta Hew, M.D., M.S. Interventional Cardiologist   Pager # [phone] Phone # [phone] [address] [place], [place] [address]   Thank you  for choosing [facility] at [facility]!!

## 2017-07-25 NOTE — Patient Instructions (Signed)
Medication instructions  No changes     Keep blood pressure log and bring with you to appointment with CV[facility] Your physician recommends that you schedule a follow-up appointment in [place] AT [place] Your physician has requested that you have an echocardiogram. Echocardiography is a painless test that uses sound waves to create images of your heart. It provides your doctor with information about the size and shape of your heart and how well your heart's chambers and valves are working. This procedure takes approximately one hour. There are no restrictions for this procedure.    Your physician recommends that you schedule a follow-up appointment in 2 [place].

## 2017-07-27 ENCOUNTER — Encounter: Payer: Self-pay | Admitting: Cardiology

## 2017-07-27 NOTE — Assessment & Plan Note (Addendum)
Well-controlled today on increased dose of Norvasc/amlodipine at 10 mg along with 20 mg Bystolic. Would probably be to consider a diuretic such as HCTZ or chlorthalidone, but currently her blood pressure looks great.  I will have her follow-up in our hypertension clinic in about a month to reassess.  We will check 2D echocardiogram given her dyspnea to assess signs of hypertensive heart disease. We will also contact her about checking renal artery Doppler.

## 2017-07-27 NOTE — Assessment & Plan Note (Signed)
Probably related to deconditioning, smoking, obesity, etc.  However given her difficult to control hypertension we will check 2D according to exclude hypertensive heart disease.

## 2017-07-27 NOTE — Assessment & Plan Note (Addendum)
Chest pain she describing sounds more musculoskeletal.  Not like her GERD pain, but it is worse with bending over or sitting and better with stretching.  Probably related to abdominal/chest wall discomfort.  I would not consider stress testing for this type of chest pain.

## 2017-07-27 NOTE — Assessment & Plan Note (Signed)
Clearly a risk factor for both her dyspnea and potential cardiac issues.  We talked about her needing to consider quitting, but she  was not ready to discuss this at this time.

## 2017-07-30 ENCOUNTER — Ambulatory Visit
Admission: RE | Admit: 2017-07-30 | Discharge: 2017-07-30 | Disposition: A | Discharge status: Home / Self Care | Payer: BLUE CROSS/BLUE SHIELD | Source: Ambulatory Visit | Attending: Family | Admitting: Family

## 2017-07-30 DIAGNOSIS — M542 Cervicalgia: Secondary | ICD-10-CM

## 2017-07-31 ENCOUNTER — Other Ambulatory Visit: Payer: Self-pay | Admitting: Family

## 2017-07-31 DIAGNOSIS — M542 Cervicalgia: Principal | ICD-10-CM

## 2017-07-31 DIAGNOSIS — G8929 Other chronic pain: Secondary | ICD-10-CM

## 2017-08-12 ENCOUNTER — Other Ambulatory Visit: Payer: Self-pay

## 2017-08-12 ENCOUNTER — Ambulatory Visit (INDEPENDENT_AMBULATORY_CARE_PROVIDER_SITE_OTHER): Payer: BLUE CROSS/BLUE SHIELD | Admitting: Nurse Practitioner

## 2017-08-12 ENCOUNTER — Ambulatory Visit (HOSPITAL_COMMUNITY): Payer: BLUE CROSS/BLUE SHIELD | Attending: Cardiovascular Disease

## 2017-08-12 ENCOUNTER — Encounter: Payer: Self-pay | Admitting: Nurse Practitioner

## 2017-08-12 VITALS — BP 152/94 | HR 90 | Temp 98.6°F | Resp 16 | Ht 69.0 in | Wt 238.0 lb

## 2017-08-12 DIAGNOSIS — F419 Anxiety disorder, unspecified: Secondary | ICD-10-CM

## 2017-08-12 DIAGNOSIS — F331 Major depressive disorder, recurrent, moderate: Secondary | ICD-10-CM

## 2017-08-12 DIAGNOSIS — F329 Major depressive disorder, single episode, unspecified: Secondary | ICD-10-CM | POA: Diagnosis not present

## 2017-08-12 DIAGNOSIS — F32A Depression, unspecified: Secondary | ICD-10-CM

## 2017-08-12 DIAGNOSIS — R0600 Dyspnea, unspecified: Secondary | ICD-10-CM

## 2017-08-12 DIAGNOSIS — I10 Essential (primary) hypertension: Secondary | ICD-10-CM | POA: Diagnosis not present

## 2017-08-12 DIAGNOSIS — R0609 Other forms of dyspnea: Principal | ICD-10-CM

## 2017-08-12 DIAGNOSIS — R079 Chest pain, unspecified: Secondary | ICD-10-CM

## 2017-08-12 MED ORDER — ESCITALOPRAM OXALATE 10 MG PO TABS: 10.0000 mg | ORAL_TABLET | Freq: Every day | ORAL | 1 refills | Status: DC

## 2017-08-12 NOTE — Progress Notes (Signed)
Name: Jennifer Waters   MRN: [id number]    DOB: 1960-11-28   Date:08/12/2017       Progress Note  Subjective  Chief Complaint  Chief Complaint  Patient presents with  . Follow-up    blood pressure    HPI   Hypertension -maintained on bystolic 20 and amlodipine 10 daily- her amlodipine was increased from 5 to 10 at last visit on 1/18 due to continued elevated BP readings. Admits she has been forgetting to take her medications every day due to recent increase in stress at work and "too many things on her mind."  Denies vision changes, chest pain, shortness of breath, edema.  BP Readings from Last 3 Encounters:  08/12/17 (!) 152/94  07/25/17 124/76  07/08/17 (!) 160/84   Depression- recently tapered off wellbutrin due to cost? And was continued on effexor. She feels like she continues to have daily worry and depression. She says she has been on effexor for many years, at first she felt that it helped her mood but more recently has noticed that she feels anxious and depressed despite taking the effexor. She has a very stressful job and stressful family issues as well. She denies any restlessness, hallucinations, confusion, thoughts of hurting herself or others. She can not remember the medications she has been on in the past for her mood.  Patient Active Problem List   Diagnosis Date Noted  . DOE (dyspnea on exertion) 07/25/2017  . Chest pain with low risk for cardiac etiology 07/25/2017  . Tobacco abuse 07/08/2017  . Depression 11/22/2016  . Cervical disc disorder with radiculopathy of cervical region 05/04/2016  . Cough 03/15/2016  . Upper back pain 12/11/2015  . Snoring 12/11/2015  . GERD (gastroesophageal reflux disease) 12/11/2015  . Laryngitis 05/13/2015  . Allergic rhinitis 05/01/2015  . Osteoarthritis 10/17/2014  . Essential hypertension 10/17/2014    Past Surgical History:  Procedure Laterality Date  . ABDOMINAL HYSTERECTOMY      Family History  Problem  Relation Age of Onset  . Arthritis Mother   . Ovarian cancer Mother   . Migraines Mother   . Lung cancer Father   . Arrhythmia Sister   . Brain cancer Brother   . Edema Sister   . Arthritis Sister   . Diabetes Brother     Social History   Socioeconomic History  . Marital status: Divorced    Spouse name: Not on file  . Number of children: 1  . Years of education: 61  . Highest education level: Not on file  Social Needs  . Financial resource strain: Not on file  . Food insecurity - worry: Not on file  . Food insecurity - inability: Not on file  . Transportation needs - medical: Not on file  . Transportation needs - non-medical: Not on file  Occupational History  . Occupation: Librarian, academic  Tobacco Use  . Smoking status: Current Every Day Smoker    Packs/day: 0.50    Years: 20.00    Pack years: 10.00    Types: Cigarettes  . Smokeless tobacco: Never Used  Substance and Sexual Activity  . Alcohol use: Yes    Alcohol/week: 3.0 oz    Types: 5 Glasses of wine per week  . Drug use: No  . Sexual activity: Yes    Birth control/protection: Surgical  Other Topics Concern  . Not on file  Social History Narrative   Fun: Puzzles, cats   Denies any religious beliefs effecting health care. Lives with  daughter in a one story home.  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  Education: some college.     Current Outpatient Medications:  .  albuterol (PROVENTIL HFA;VENTOLIN HFA) 108 (90 Base) MCG/ACT inhaler, Inhale 2 puffs into the lungs every 6 (six) hours as needed for wheezing or shortness of breath., Disp: 1 Inhaler, Rfl: 0 .  amLODipine (NORVASC) 10 MG tablet, Take 1 tablet (10 mg total) by mouth daily., Disp: 30 tablet, Rfl: 1 .  cetirizine (ZYRTEC) 10 MG tablet, Take 1 tablet (10 mg total) by mouth daily., Disp: 90 tablet, Rfl: 0 .  fluticasone (FLONASE) 50 MCG/ACT nasal spray, Place 2 sprays into both nostrils daily., Disp: 16 g, Rfl: 6 .  gabapentin (NEURONTIN) 300 MG capsule, Take 1  capsule (300 mg total) by mouth 3 (three) times daily., Disp: 90 capsule, Rfl: 0 .  Ibuprofen-Famotidine (DUEXIS) 800-26.6 MG TABS, Take 1 tablet by mouth 3 (three) times daily as needed., Disp: 90 tablet, Rfl: 0 .  Nebivolol HCl (BYSTOLIC) 20 MG TABS, Take 1 tablet (20 mg total) by mouth daily., Disp: 90 tablet, Rfl: 0 .  omeprazole (PRILOSEC) 20 MG capsule, TAKE 1 CAPSULE BY MOUTH EVERY DAY, Disp: 90 capsule, Rfl: 0 .  valACYclovir (VALTREX) 500 MG tablet, Take 1 tablet (500 mg total) by mouth 2 (two) times daily. (Patient taking differently: Take 500 mg by mouth 2 (two) times daily. As needed outbreak), Disp: 6 tablet, Rfl: 3 .  venlafaxine XR (EFFEXOR-XR) 75 MG 24 hr capsule, Take 1 capsule (75 mg total) by mouth 2 (two) times daily. Must keep appt w/new provider for future refills, Disp: 180 capsule, Rfl: 0  No Known Allergies   ROS See HPI  Objective  Vitals:   08/12/17 1130  BP: (!) 152/94  Pulse: 90  Resp: 16  Temp: 98.6 F (37 C)  TempSrc: Oral  SpO2: 96%  Weight: 238 lb (108 kg)  Height: 5\' 9"  (1.753 m)    Body mass index is 35.15 kg/m.  Physical Exam Vital signs reviewed. Constitutional: Patient appears well-developed and well-nourished. No distress.  HENT: Head: Normocephalic and atraumatic. Nose: Nose normal. Mouth/Throat: Oropharynx is clear and moist.  Eyes: Conjunctivae and EOM are normal. No scleral icterus.  Neck: Normal range of motion. Neck supple.  Cardiovascular: Normal rate, regular rhythm and normal heart sounds.  No BLE edema. Distal pulses intact. Pulmonary/Chest: Effort normal and breath sounds normal. No respiratory distress. Abdominal: Soft. no distension.  Musculoskeletal: Normal range of motion, no joint effusions. No gross deformities Neurological: she is alert and oriented to person, place, and time. Coordination, balance, strength, speech and gait are normal.  Skin: Skin is warm and dry. No rash noted. No erythema.  Psychiatric: Patient has  a normal mood and affect. behavior is normal.  Assessment & Plan RTC in about 2 weeks for follow up of blood pressure, switch from effexor to lexapro

## 2017-08-12 NOTE — Patient Instructions (Addendum)
Please continue your bystolic and amlodipine-make sure you are taking the medications daily so we can make sure they are working!  Stop effexor and start lexapro 10mg  daily- we will see if this helps your mood.  Please return in about 2-3 weeks so we can recheck your blood pressure and see how you are doing on the new medication  It was nice to see you. Thanks for letting me take care of you today :)   Exercising to Stay Healthy Exercising regularly is important. It has many health benefits, such as:  Improving your overall fitness, flexibility, and endurance.  Increasing your bone density.  Helping with weight control.  Decreasing your body fat.  Increasing your muscle strength.  Reducing stress and tension.  Improving your overall health.  In order to become healthy and stay healthy, it is recommended that you do moderate-intensity and vigorous-intensity exercise. You can tell that you are exercising at a moderate intensity if you have a higher heart rate and faster breathing, but you are still able to hold a conversation. You can tell that you are exercising at a vigorous intensity if you are breathing much harder and faster and cannot hold a conversation while exercising. How often should I exercise? Choose an activity that you enjoy and set realistic goals. Your health care provider can help you to make an activity plan that works for you. Exercise regularly as directed by your health care provider. This may include:  Doing resistance training twice each week, such as: ? Push-ups. ? Sit-ups. ? Lifting weights. ? Using resistance bands.  Doing a given intensity of exercise for a given amount of time. Choose from these options: ? 150 minutes of moderate-intensity exercise every week. ? 75 minutes of vigorous-intensity exercise every week. ? A mix of moderate-intensity and vigorous-intensity exercise every week.  Children, pregnant women, people who are out of shape, people  who are overweight, and older adults may need to consult a health care provider for individual recommendations. If you have any sort of medical condition, be sure to consult your health care provider before starting a new exercise program. What are some exercise ideas? Some moderate-intensity exercise ideas include:  Walking at a rate of 1 mile in 15 minutes.  Biking.  Hiking.  Golfing.  Dancing.  Some vigorous-intensity exercise ideas include:  Walking at a rate of at least 4.5 miles per hour.  Jogging or running at a rate of 5 miles per hour.  Biking at a rate of at least 10 miles per hour.  Lap swimming.  Roller-skating or in-line skating.  Cross-country skiing.  Vigorous competitive sports, such as football, basketball, and soccer.  Jumping rope.  Aerobic dancing.  What are some everyday activities that can help me to get exercise?  [place] work, such as: ? Pushing a Conservation officer, nature. ? Raking and bagging leaves.  Washing and waxing your car.  Pushing a stroller.  Shoveling snow.  Gardening.  Washing windows or floors. How can I be more active in my day-to-day activities?  Use the stairs instead of the elevator.  Take a walk during your lunch break.  If you drive, park your car farther away from work or school.  If you take public transportation, get off one stop early and walk the rest of the way.  Make all of your phone calls while standing up and walking around.  Get up, stretch, and walk around every 30 minutes throughout the day. What guidelines should I follow while  exercising?  Do not exercise so much that you hurt yourself, feel dizzy, or get very short of breath.  Consult your health care provider before starting a new exercise program.  Wear comfortable clothes and shoes with good support.  Drink plenty of water while you exercise to prevent dehydration or heat stroke. Body water is lost during exercise and must be replaced.  Work out until  you breathe faster and your heart beats faster. This information is not intended to replace advice given to you by your health care provider. Make sure you discuss any questions you have with your health care provider. Document Released: 07/10/2010 Document Revised: 11/13/2015 Document Reviewed: 11/08/2013 Elsevier Interactive Patient Education  [employer].

## 2017-08-12 NOTE — Assessment & Plan Note (Addendum)
We discussed the importance of daily medication compliance in the maintenance of hypertension. We also Discussed the role of healthy diet and exercise in the management of hypertension.-See AVS for education provided to patient She has pill boxes at home, she plans to put her medications into daily boxes to help her remember to take them She will RTC in about 2-3 weeks for follow up. After our visit, chart review revealed that cardiology has started to work with patient on her blood pressure- they are considering sending her to hypertension clinic-see Dr Ellyn Hack note 07/25/17. Will check for updates with patient at her next visit.

## 2017-08-12 NOTE — Assessment & Plan Note (Signed)
We discussed trialing a dfferent medication for anxiety and depression and she is agreeable. She also plans to start exercising to help her stress and blood pressure. She will stop effexor and start lexapro- side effects including suicidality and dosing discussed with patient- she understands to stop the medication and go to ER if she experiences suicidality. - escitalopram (LEXAPRO) 10 MG tablet; Take 1 tablet (10 mg total) by mouth daily.  Dispense: 30 tablet; Refill: 1 RTC in about 2-3 weeks for follow up

## 2017-08-15 ENCOUNTER — Telehealth: Payer: Self-pay | Admitting: *Deleted

## 2017-08-15 NOTE — Telephone Encounter (Signed)
LEFT MESSAGE TO CALL BACK

## 2017-08-15 NOTE — Telephone Encounter (Signed)
-----   Message from Leonie Man, MD sent at 08/14/2017  4:48 PM EST ----- ECHOCARDIOGRAM results: Normal left ventricle size and function.  Vigorous Contraction with ejection fraction 65-70%.  No wall motion normalities.  Only grade 1 diastolic dysfunction.   Normal valves.  Essentially normal echo.  Nothing to explain chest pain.  Glenetta Hew, MD

## 2017-08-26 ENCOUNTER — Ambulatory Visit: Payer: BLUE CROSS/BLUE SHIELD

## 2017-09-09 ENCOUNTER — Ambulatory Visit (INDEPENDENT_AMBULATORY_CARE_PROVIDER_SITE_OTHER): Payer: BLUE CROSS/BLUE SHIELD | Admitting: Pharmacist Clinician (PhC)/ Clinical Pharmacy Specialist

## 2017-09-09 ENCOUNTER — Ambulatory Visit (INDEPENDENT_AMBULATORY_CARE_PROVIDER_SITE_OTHER): Payer: BLUE CROSS/BLUE SHIELD | Admitting: Nurse Practitioner

## 2017-09-09 ENCOUNTER — Encounter: Payer: Self-pay | Admitting: Nurse Practitioner

## 2017-09-09 ENCOUNTER — Telehealth: Payer: Self-pay | Admitting: Nurse Practitioner

## 2017-09-09 VITALS — BP 122/72 | HR 72

## 2017-09-09 VITALS — BP 132/80 | HR 80 | Resp 16 | Ht 69.0 in | Wt 239.0 lb

## 2017-09-09 DIAGNOSIS — R519 Headache, unspecified: Secondary | ICD-10-CM

## 2017-09-09 DIAGNOSIS — F32A Depression, unspecified: Secondary | ICD-10-CM

## 2017-09-09 DIAGNOSIS — F329 Major depressive disorder, single episode, unspecified: Secondary | ICD-10-CM | POA: Diagnosis not present

## 2017-09-09 DIAGNOSIS — I10 Essential (primary) hypertension: Secondary | ICD-10-CM | POA: Diagnosis not present

## 2017-09-09 DIAGNOSIS — Z720 Tobacco use: Secondary | ICD-10-CM | POA: Diagnosis not present

## 2017-09-09 DIAGNOSIS — F419 Anxiety disorder, unspecified: Secondary | ICD-10-CM | POA: Diagnosis not present

## 2017-09-09 DIAGNOSIS — R51 Headache: Secondary | ICD-10-CM | POA: Diagnosis not present

## 2017-09-09 MED ORDER — VARENICLINE TARTRATE 1 MG PO TABS: 1.0000 mg | ORAL_TABLET | Freq: Two times a day (BID) | ORAL | 2 refills | Status: DC

## 2017-09-09 MED ORDER — ESCITALOPRAM OXALATE 10 MG PO TABS: 10.0000 mg | ORAL_TABLET | Freq: Every day | ORAL | 2 refills | Status: DC

## 2017-09-09 MED ORDER — VARENICLINE TARTRATE 0.5 MG PO TABS: 0.5000 mg | ORAL_TABLET | Freq: Two times a day (BID) | ORAL | 0 refills | Status: DC

## 2017-09-09 NOTE — Patient Instructions (Addendum)
Your blood pressure today is 122/72 mmHg  Check your blood pressure at home daily (if able) and keep record of the readings. Call us if you have any questions Jennifer Waters/[name] [phone]   Take your BP meds as follows:  Continue amlodipine 5 mg twice daily   Continue nebivolol 20 mg once daily   Bring all of your meds, your BP cuff and your record of home blood pressures to your next appointment.  Exercise as you're able, try to walk approximately 30 minutes per day.  Keep salt intake to a minimum, especially watch canned and prepared boxed foods.  Eat more fresh fruits and vegetables and fewer canned items.  Avoid eating in fast food restaurants.    HOW TO TAKE YOUR BLOOD PRESSURE: . Rest 5 minutes before taking your blood pressure. .  Don't smoke or drink caffeinated beverages for at least 30 minutes before. . Take your blood pressure before (not after) you eat. . Sit comfortably with your back supported and both feet on the floor (don't cross your legs). . Elevate your arm to heart level on a table or a desk. . Use the proper sized cuff. It should fit smoothly and snugly around your bare upper arm. There should be enough room to slip a fingertip under the cuff. The bottom edge of the cuff should be 1 inch above the crease of the elbow. . Ideally, take 3 measurements at one sitting and record the average.

## 2017-09-09 NOTE — Assessment & Plan Note (Signed)
Mood has improved with lexapro Will continue medication at current dosage and she will F/U for new, worsening symptoms - escitalopram (LEXAPRO) 10 MG tablet; Take 1 tablet (10 mg total) by mouth daily.  Dispense: 90 tablet; Refill: 2

## 2017-09-09 NOTE — Progress Notes (Addendum)
Name: Jennifer Waters   MRN: [id number]    DOB: [birth year]   Date:09/09/2017       Progress Note  Subjective  Chief Complaint  Chief Complaint  Patient presents with  . Follow-up    Blood pressure, depression med change    HPI She is here for follow up of BP, anxiety and depression. She is also requesting a refill on headache medication and wants to quit smoking  Hypertension -maintained on amlodipine 10, bystolic 20. At her last OV she was not taking her medications daily as prescribed. She tells me today that she has been taking her medications daily, trying to watch her diet, and is following up with cardiology for BP management- her next visit is actually this afternoon. Denies vision changes, chest pain, shortness of breath, edema.  BP Readings from Last 3 Encounters:  09/09/17 132/80  08/12/17 (!) 152/94  07/25/17 124/76   Anxiety and depression- At her last office visit we switched from effexor to lexapro 10 due to uncontrolled symptoms despite taking the effexor daily. She has been taking the lexapro daily with no adverse effects. She feels that her mood is much improved on the lexapro - even says she has been doing her hair and makeup because she feels so much better She also says some of the stressors she was having at home have gotten better including a household member that was unemployed is now working full time. She denies any restlessness, hallucinations, confusion, thoughts of hurting herself or others.  Depression screen PHQ 2/9 06/03/2017  Decreased Interest 2  Down, Depressed, Hopeless 3  PHQ - 2 Score 5  Altered sleeping 3  Tired, decreased energy 3  Change in appetite 3  Feeling bad or failure about yourself  3  Trouble concentrating 1  Moving slowly or fidgety/restless 0  Suicidal thoughts 1  PHQ-9 Score 19   GAD 7 : Generalized Anxiety Score 06/03/2017  Nervous, Anxious, on Edge 3  Control/stop worrying 3  Worry too much - different things 3   Trouble relaxing 3  Restless 0  Easily annoyed or irritable 0  Afraid - awful might happen 1  Total GAD 7 Score 13   Headaches- States she has been on depakote daily for headaches for years She is asking for a refill today She tells me she had a big workup for headaches in the past and was placed on depakote which has helped prevent her headaches since She says she will get a headache if she is not taking the depakote She denies any seizures, tremor, fevers, weakness, syncope, vision changes, confusion, slurred speech, nausea, vomiting.  Smoking-  She has been thinking about quitting smoking for some time now and and feels ready to stop today She has reduced her cigarette number and found alternatives to keep her busy such as listening to music when she has felt like smoking She really wants to quit since she has seen family members with chronic illness/death due to cigarette smoking and she knows it will help her blood pressure   Patient Active Problem List   Diagnosis Date Noted  . Tobacco abuse 07/08/2017  . Anxiety and depression 11/22/2016  . Cervical disc disorder with radiculopathy of cervical region 05/04/2016  . Cough 03/15/2016  . Snoring 12/11/2015  . GERD (gastroesophageal reflux disease) 12/11/2015  . Allergic rhinitis 05/01/2015  . Osteoarthritis 10/17/2014  . Essential hypertension 10/17/2014    Past Surgical History:  Procedure Laterality Date  . ABDOMINAL HYSTERECTOMY  Family History  Problem Relation Age of Onset  . Arthritis Mother   . Ovarian cancer Mother   . Migraines Mother   . Lung cancer Father   . Arrhythmia Sister   . Brain cancer Brother   . Edema Sister   . Arthritis Sister   . Diabetes Brother     Social History   Socioeconomic History  . Marital status: Divorced    Spouse name: Not on file  . Number of children: 1  . Years of education: 53  . Highest education level: Not on file  Occupational History  . Occupation:  Therapist, music  . Financial resource strain: Not on file  . Food insecurity:    Worry: Not on file    Inability: Not on file  . Transportation needs:    Medical: Not on file    Non-medical: Not on file  Tobacco Use  . Smoking status: Current Every Day Smoker    Packs/day: 0.50    Years: 20.00    Pack years: 10.00    Types: Cigarettes  . Smokeless tobacco: Never Used  Substance and Sexual Activity  . Alcohol use: Yes    Alcohol/week: 3.0 oz    Types: 5 Glasses of wine per week  . Drug use: No  . Sexual activity: Yes    Birth control/protection: Surgical  Lifestyle  . Physical activity:    Days per week: Not on file    Minutes per session: Not on file  . Stress: Not on file  Relationships  . Social connections:    Talks on phone: Not on file    Gets together: Not on file    Attends religious service: Not on file    Active member of club or organization: Not on file    Attends meetings of clubs or organizations: Not on file    Relationship status: Not on file  . Intimate partner violence:    Fear of current or ex partner: Not on file    Emotionally abused: Not on file    Physically abused: Not on file    Forced sexual activity: Not on file  Other Topics Concern  . Not on file  Social History Narrative   Fun: Puzzles, cats   Denies any religious beliefs effecting health care. Lives with daughter in a one story home.  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  Education: some college.     Current Outpatient Medications:  .  albuterol (PROVENTIL HFA;VENTOLIN HFA) 108 (90 Base) MCG/ACT inhaler, Inhale 2 puffs into the lungs every 6 (six) hours as needed for wheezing or shortness of breath., Disp: 1 Inhaler, Rfl: 0 .  amLODipine (NORVASC) 10 MG tablet, Take 1 tablet (10 mg total) by mouth daily., Disp: 30 tablet, Rfl: 1 .  cetirizine (ZYRTEC) 10 MG tablet, Take 1 tablet (10 mg total) by mouth daily., Disp: 90 tablet, Rfl: 0 .  escitalopram (LEXAPRO) 10 MG tablet, Take 1  tablet (10 mg total) by mouth daily., Disp: 30 tablet, Rfl: 1 .  fluticasone (FLONASE) 50 MCG/ACT nasal spray, Place 2 sprays into both nostrils daily., Disp: 16 g, Rfl: 6 .  gabapentin (NEURONTIN) 300 MG capsule, Take 1 capsule (300 mg total) by mouth 3 (three) times daily., Disp: 90 capsule, Rfl: 0 .  Ibuprofen-Famotidine (DUEXIS) 800-26.6 MG TABS, Take 1 tablet by mouth 3 (three) times daily as needed., Disp: 90 tablet, Rfl: 0 .  Nebivolol HCl (BYSTOLIC) 20 MG TABS, Take 1 tablet (20  mg total) by mouth daily., Disp: 90 tablet, Rfl: 0 .  omeprazole (PRILOSEC) 20 MG capsule, TAKE 1 CAPSULE BY MOUTH EVERY DAY, Disp: 90 capsule, Rfl: 0 .  valACYclovir (VALTREX) 500 MG tablet, Take 1 tablet (500 mg total) by mouth 2 (two) times daily. (Patient taking differently: Take 500 mg by mouth 2 (two) times daily. As needed outbreak), Disp: 6 tablet, Rfl: 3 .  venlafaxine XR (EFFEXOR-XR) 75 MG 24 hr capsule, Take 1 capsule (75 mg total) by mouth 2 (two) times daily. Must keep appt w/new provider for future refills, Disp: 180 capsule, Rfl: 0  No Known Allergies   ROS See HPI  Objective  Vitals:   09/09/17 1131  BP: 132/80  Pulse: 80  Resp: 16  SpO2: 93%  Weight: 239 lb (108.4 kg)  Height: 5\' 9"  (1.753 m)    Body mass index is 35.29 kg/m.  Physical Exam Vital signs reviewed. Constitutional: Patient appears well-developed and well-nourished. No distress.  HENT: Head: Normocephalic and atraumatic. Nose: Nose normal. Mouth/Throat: Oropharynx is clear and moist.  Eyes: Conjunctivae and EOM are normal. Pupils are equal, round, and reactive to light. No scleral icterus.  Neck: Normal range of motion. Neck supple.  Cardiovascular: Normal rate, regular rhythm and normal heart sounds. No BLE edema. Distal pulses intact. Pulmonary/Chest: Effort normal and breath sounds normal. No respiratory distress. Neurological: She is alert and oriented to person, place, and time. No cranial nerve deficit.  Coordination, balance, strength, speech and gait are normal.  Skin: Skin is warm and dry. No rash noted. No erythema.  Psychiatric: Patient has a normal mood and affect. behavior is normal. Judgment and thought content normal.   Assessment & Plan RTC in 1 month for F/U of nicotine dependence- starting chantix; CPE if stable  Nonintractable headache, unspecified chronicity pattern, unspecified headache type Requested refill of depakote at end of visit today, on chart review after she left it does not look like she is currently on the depakote I told her on prior phone call that I would maintain her depakote (which was started by a prior provider) until she was able to F/U with neurology for maintenance and management of her headaches, placed the referral to neurology on 06/30/17 She told me today that she has not scheduled yet but does have the phone number for [facility] and plans to call to schedule but wanted to get her blood pressure under control first I am actually not sure if she is still taking the depakote or has been off of it at this point, apparently per phone notes she had a large amount left from her prior provider. I will reach out to her to see if she is still on the depakote prior to sending refill- if she is not taking it we may consider alternatives for treatment of headaches while awaiting neuro consult due to need for close monitoring of depakote

## 2017-09-09 NOTE — Assessment & Plan Note (Signed)
Asked about tobacco use. She was advised to quit. Assess willingness to make an attempt: She is motivated and ready to quit. Assist in quit attempt: We discussed both medication options and behavoiral modifications. 1-800-QUIT-NOW (daily talk therapy); squeeze ball, juggling change She would like to try chantix-Side effects and medication dosing discussed- including nausea and vivid dreams-nausea should improve over time- start chantix then quit in 2 weeks (or decrease by half) She is going to check on work resources for coping with smoking Medications ordered: - varenicline (CHANTIX) 0.5 MG tablet; Take 1 tablet (0.5 mg total) by mouth 2 (two) times daily. Take 1 tablet once daily day 1-3, then 1 tablet twice daily day 4-7  Dispense: 11 tablet; Refill: 0 - varenicline (CHANTIX CONTINUING MONTH PAK) 1 MG tablet; Take 1 tablet (1 mg total) by mouth 2 (two) times daily.  Dispense: 60 tablet; Refill: 2 Pt education handout given. 3 minutes were spent counseling on smoking risk factors and going over medications for assistance. Arrange follow up: She will RTC in about 1 month for follow up. - varenicline (CHANTIX) 0.5 MG tablet; Take 1 tablet (0.5 mg total) by mouth 2 (two) times daily.  Dispense: 11 tablet; Refill: 0 - varenicline (CHANTIX CONTINUING MONTH PAK) 1 MG tablet; Take 1 tablet (1 mg total) by mouth 2 (two) times daily.  Dispense: 60 tablet; Refill: 2

## 2017-09-09 NOTE — Telephone Encounter (Signed)
At the end of her visit today, she asked me for another refill of her depakote, which I did say I would refill. However it looks like the depakote is completely gone from her chart. If she has already stopped taking the depakote, I would like to try the topamax until she gets to neurology if she is still willing to try that. I am worried about the safety and monitoring of the depakote, topamax would be a safer alternative. Can we find out when she stopped taking the depakote and would she try the topamax instead if she has already stopped taking the depakote?

## 2017-09-09 NOTE — Patient Instructions (Addendum)
I am proud of you for making the decision to quit smoking!! I have sent the prescription for chantix. On Days 1 to 3, you will take 0.5 mg once daily On Days 4 to 7, you will take  0.5 mg twice daily Then after Day 7, you will take 1 mg twice daily for up to 11 weeks. You should plan to quite smoking 2 weeks after starting the chantix. The chantix will come in 2 packs, one starter pack for days 1-7, then a continuing pack with refills to start on day 8. It will also be helpful to get a stress ball or something else that you can use to keep your hands busy when you get the urge to smoke. The chantix does make people feel very nauseated at first, but this will get better after a few days. Also, some people say that it makes them have crazy dreams, but no nightmares or bad dreams. I'd like for you to come in to see me in about 1 month, to make sure you are doing okay on the chantix and see how you are doing with quitting smoking. 1-800-QUIT-NOW can also offer daily talk therapy or other help if you need.   Come back in 1 month so I can see how you are doing.  We can do your physical if you feel well at your next visit!  Mediterranean Diet A Mediterranean diet refers to food and lifestyle choices that are based on the traditions of countries located on the [employer]. This way of eating has been shown to help prevent certain conditions and improve outcomes for people who have chronic diseases, like kidney disease and heart disease. What are tips for following this plan? Lifestyle  Cook and eat meals together with your family, when possible.  Drink enough fluid to keep your urine clear or pale yellow.  Be physically active every day. This includes: ? Aerobic exercise like running or swimming. ? Leisure activities like gardening, walking, or housework.  Get 7-8 hours of sleep each night.  If recommended by your health care provider, drink red wine in moderation. This means 1 glass  a day for nonpregnant women and 2 glasses a day for men. A glass of wine equals 5 oz (150 mL). Reading food labels  Check the serving size of packaged foods. For foods such as rice and pasta, the serving size refers to the amount of cooked product, not dry.  Check the total fat in packaged foods. Avoid foods that have saturated fat or trans fats.  Check the ingredients list for added sugars, such as corn syrup. Shopping  At the grocery store, buy most of your food from the areas near the walls of the store. This includes: ? Fresh fruits and vegetables (produce). ? Grains, beans, nuts, and seeds. Some of these may be available in unpackaged forms or large amounts (in bulk). ? Fresh seafood. ? Poultry and eggs. ? Low-fat dairy products.  Buy whole ingredients instead of prepackaged foods.  Buy fresh fruits and vegetables in-season from local farmers markets.  Buy frozen fruits and vegetables in resealable bags.  If you do not have access to quality fresh seafood, buy precooked frozen shrimp or canned fish, such as tuna, salmon, or sardines.  Buy small amounts of raw or cooked vegetables, salads, or olives from the deli or salad bar at your store.  Stock your pantry so you always have certain foods on hand, such as olive oil, canned tuna, canned tomatoes,  rice, pasta, and beans. Cooking  Cook foods with extra-virgin olive oil instead of using butter or other vegetable oils.  Have meat as a side dish, and have vegetables or grains as your main dish. This means having meat in small portions or adding small amounts of meat to foods like pasta or stew.  Use beans or vegetables instead of meat in common dishes like chili or lasagna.  Experiment with different cooking methods. Try roasting or broiling vegetables instead of steaming or sauteing them.  Add frozen vegetables to soups, stews, pasta, or rice.  Add nuts or seeds for added healthy fat at each meal. You can add these to  yogurt, salads, or vegetable dishes.  Marinate fish or vegetables using olive oil, lemon juice, garlic, and fresh herbs. Meal planning  Plan to eat 1 vegetarian meal one day each week. Try to work up to 2 vegetarian meals, if possible.  Eat seafood 2 or more times a week.  Have healthy snacks readily available, such as: ? Vegetable sticks with hummus. ? [place] yogurt. ? Fruit and nut trail mix.  Eat balanced meals throughout the week. This includes: ? Fruit: 2-3 servings a day ? Vegetables: 4-5 servings a day ? Low-fat dairy: 2 servings a day ? Fish, poultry, or lean meat: 1 serving a day ? Beans and legumes: 2 or more servings a week ? Nuts and seeds: 1-2 servings a day ? Whole grains: 6-8 servings a day ? Extra-virgin olive oil: 3-4 servings a day  Limit red meat and sweets to only a few servings a month What are my food choices?  Mediterranean diet ? Recommended ? Grains: Whole-grain pasta. Brown rice. Bulgar wheat. Polenta. Couscous. Whole-wheat bread. [place]. ? Vegetables: Artichokes. Beets. Broccoli. Cabbage. Carrots. Eggplant. Green beans. Chard. Kale. Spinach. Onions. Leeks. Peas. Squash. Tomatoes. Peppers. Radishes. ? Fruits: Apples. Apricots. Avocado. Berries. Bananas. Cherries. Dates. Figs. Grapes. Lemons. Melon. Oranges. Peaches. Plums. Pomegranate. ? Meats and other protein foods: Beans. Almonds. Sunflower seeds. Pine nuts. Peanuts. [place]. Salmon. Scallops. Shrimp. [place]. Tilapia. Clams. Oysters. Eggs. ? Dairy: Low-fat milk. Cheese. Greek yogurt. ? Beverages: Water. Red wine. Herbal tea. ? Fats and oils: Extra virgin olive oil. Avocado oil. Grape seed oil. ? Sweets and desserts: [place] yogurt with honey. Baked apples. Poached pears. Trail mix. ? Seasoning and other foods: Basil. Cilantro. Coriander. Cumin. Mint. Parsley. Sage. Rosemary. Tarragon. Garlic. Oregano. Thyme. Pepper. Balsalmic vinegar. Tahini. Hummus. Tomato sauce. Olives. Mushrooms. ? Limit  these ? Grains: Prepackaged pasta or rice dishes. Prepackaged cereal with added sugar. ? Vegetables: Deep fried potatoes (french fries). ? Fruits: Fruit canned in syrup. ? Meats and other protein foods: Beef. Pork. Lamb. Poultry with skin. Hot dogs. [place] Salines. ? Dairy: Ice cream. Sour cream. Whole milk. ? Beverages: Juice. Sugar-sweetened soft drinks. Beer. Liquor and spirits. ? Fats and oils: Butter. Canola oil. Vegetable oil. Beef fat (tallow). Lard. ? Sweets and desserts: Cookies. Cakes. Pies. Candy. ? Seasoning and other foods: Mayonnaise. Premade sauces and marinades. ? The items listed may not be a complete list. Talk with your dietitian about what dietary choices are right for you. Summary  The Mediterranean diet includes both food and lifestyle choices.  Eat a variety of fresh fruits and vegetables, beans, nuts, seeds, and whole grains.  Limit the amount of red meat and sweets that you eat.  Talk with your health care provider about whether it is safe for you to drink red wine in moderation. This means 1 glass a  day for nonpregnant women and 2 glasses a day for men. A glass of wine equals 5 oz (150 mL). This information is not intended to replace advice given to you by your health care provider. Make sure you discuss any questions you have with your health care provider. Document Released: 01/29/2016 Document Revised: 03/02/2016 Document Reviewed: 01/29/2016 Elsevier Interactive Patient Education  [employer].

## 2017-09-09 NOTE — Assessment & Plan Note (Signed)
Patient's hypertension appears to be well controlled in clinic today with a BP of 122/72 mmHg in the office. She does not routinely check her BP at home although she has a new BP machine. She is tolerating her medication regimen well with no side effects reported. Will not make any changes at this time. Provided patient with a blood pressure log and instructed patient to take blood pressure a few times a week to get an idea of what her BP is running at home. Patient was praised for taking the initiative to begin smoking cessation and slowly trying to improve her lifestyle. Educated patient on the importance of a healthy diet with low sodium. Encouraged patient to begin walking either on treadmill or around neighborhood a few times a week and slowly increase frequency/time. Patient appeared to be very receptive to recommendations. At this time, will not schedule a follow-up visit. Provided patient with the direct number to the pharmacist's desk to call if she had any problems or if she was consistently seeing elevated BP readings at home.

## 2017-09-09 NOTE — Progress Notes (Signed)
09/09/2017 Jennifer Waters 02-02-1961 [id number]   HPI:  Jennifer Waters is a 57 y.o. female patient of Dr. Ellyn Hack, with a PMH significant for hypertension, depression, and chronic migraines. Her most recent blood pressure was 152/94 mmHg. Amlodipine was increased to 10 mg daily with the addition of bystolic 20 mg daily in 03/2584. Patient is feeling great today. She denies any SOB, dizziness, or swelling of the ankles/feet. Patient has experienced some chest pain (heartburn) a couple times a week if she forgets to take her medication for acid reflux.  Patient recently bought a pill box because she was having a hard time remembering if she took her medication doses. 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 She reports that her days are very routine based to the point where she couldn't remember if she was taking her medications everyday. Since purchasing a pill box she reports she is not missing any doses of medications.  Blood Pressure Goal:  130/80 mmHg  Current Medications:  Amlodipine 5 mg twice daily   Nebivolol 20 mg qd  Cardiac Hx:  Hypertension  Family Hx:  Sister has a pacemaker - unsure of heart condition  Daughter has high blood pressure     Social Hx:  Tobacco: 1 pack/day; picked up prescription for chantix today   Alcohol: Mixed drink with tequila or glass of wine a 1-2 times a month  Caffeine: Coffee is half decaf/half regular (~ 4 cups/day); drinks caffeine free soda  Diet:  Her daughter cooks most meals at home. However, she does eat out frequently. Daughter cooks a wide variety of foods;  vegetables mostly frozen and occasionally canned; does not add table salt to food.  Her daughter's boyfriend works at two [employer] and he brings food home occasionally.    Eats at [place] once a week.   Exercise:  None.     Home BP readings:  Has a new blood  pressure cuff at home but does not routinely check BP  Intolerances:   Aspirin products- irritates stomach   Wt Readings from Last 3 Encounters:  09/09/17 239 lb (108.4 kg)  08/12/17 238 lb (108 kg)  07/25/17 237 lb 3.2 oz (107.6 kg)   BP Readings from Last 3 Encounters:  09/09/17 122/72  09/09/17 132/80  08/12/17 (!) 152/94   Pulse Readings from Last 3 Encounters:  09/09/17 72  09/09/17 80  08/12/17 90    Current Outpatient Medications  Medication Sig Dispense Refill  . albuterol (PROVENTIL HFA;VENTOLIN HFA) 108 (90 Base) MCG/ACT inhaler Inhale 2 puffs into the lungs every 6 (six) hours as needed for wheezing or shortness of breath. 1 Inhaler 0  . amLODipine (NORVASC) 10 MG tablet Take 1 tablet (10 mg total) by mouth daily. 30 tablet 1  . cetirizine (ZYRTEC) 10 MG tablet Take 1 tablet (10 mg total) by mouth daily. 90 tablet 0  . escitalopram (LEXAPRO) 10 MG tablet Take 1 tablet (10 mg total) by mouth daily. 90 tablet 2  . fluticasone (FLONASE) 50 MCG/ACT nasal spray Place 2 sprays into both nostrils daily. 16 g 6  . gabapentin (NEURONTIN) 300 MG capsule Take 1 capsule (300 mg total) by mouth 3 (three) times daily. 90 capsule 0  . Ibuprofen-Famotidine (DUEXIS) 800-26.6 MG TABS Take 1 tablet by mouth 3 (three) times daily as needed. 90 tablet 0  . Nebivolol HCl (BYSTOLIC) 20 MG TABS  Take 1 tablet (20 mg total) by mouth daily. 90 tablet 0  . omeprazole (PRILOSEC) 20 MG capsule TAKE 1 CAPSULE BY MOUTH EVERY DAY 90 capsule 0  . valACYclovir (VALTREX) 500 MG tablet Take 1 tablet (500 mg total) by mouth 2 (two) times daily. (Patient taking differently: Take 500 mg by mouth 2 (two) times daily. As needed outbreak) 6 tablet 3  . varenicline (CHANTIX CONTINUING MONTH PAK) 1 MG tablet Take 1 tablet (1 mg total) by mouth 2 (two) times daily. 60 tablet 2  . varenicline (CHANTIX) 0.5 MG tablet Take 1 tablet (0.5 mg total) by mouth 2 (two) times daily. 11 tablet 0   No current  facility-administered medications for this visit.     No Known Allergies  Past Medical History:  Diagnosis Date  . Arthritis   . Hypertension   . Migraines     Blood pressure 122/72, pulse 72.  Essential hypertension Patient's hypertension appears to be well controlled in clinic today with a BP of 122/72 mmHg in the office. She does not routinely check her BP at home although she has a new BP machine. She is tolerating her medication regimen well with no side effects reported. Will not make any changes at this time. Provided patient with a blood pressure log and instructed patient to take blood pressure a few times a week to get an idea of what her BP is running at home. Patient was praised for taking the initiative to begin smoking cessation and slowly trying to improve her lifestyle. Educated patient on the importance of a healthy diet with low sodium. Encouraged patient to begin walking either on treadmill or around neighborhood a few times a week and slowly increase frequency/time. Patient appeared to be very receptive to recommendations. At this time, will not schedule a follow-up visit. Provided patient with the direct number to the pharmacist's desk to call if she had any problems or if she was consistently seeing elevated BP readings at home.    Note written by: Jerilynn Mages, PharmD Candidate.  Patient was seen in conjunction with myself and I agree with the above assessment and plan.    Tommy Medal PharmD CPP [facility] [address] [place], [place] [address] [phone]

## 2017-09-09 NOTE — Assessment & Plan Note (Signed)
Per chart review, it looks like cardiology is now helping with patients blood pressure at their hypertension clinic, she plans to continue F/U with cardiology for HTN management She is very interested in learning about a healthy diet today- we discussed weight watchers, mediterranean diet and referral to nutritional education which she is interested in  - Ambulatory referral to Nutrition and Diabetic Education

## 2017-09-12 ENCOUNTER — Other Ambulatory Visit: Payer: Self-pay

## 2017-09-12 DIAGNOSIS — I10 Essential (primary) hypertension: Secondary | ICD-10-CM

## 2017-09-12 MED ORDER — NEBIVOLOL HCL 20 MG PO TABS: 20.0000 mg | ORAL_TABLET | Freq: Every day | ORAL | 0 refills | Status: DC

## 2017-09-12 MED ORDER — AMLODIPINE BESYLATE 10 MG PO TABS: 10.0000 mg | ORAL_TABLET | Freq: Every day | ORAL | 1 refills | Status: DC

## 2017-09-13 NOTE — Telephone Encounter (Signed)
LVM for pt to call back in regards.  

## 2017-09-22 ENCOUNTER — Telehealth: Payer: Self-pay | Admitting: Nurse Practitioner

## 2017-09-22 NOTE — Telephone Encounter (Signed)
Copied from [place]. Topic: Quick Communication - See Telephone Encounter >> Sep 22, 2017  3:51 PM Synthia Innocent wrote: CRM for notification. See Telephone encounter for: 09/22/17. Needing PA on Ibuprofen-Famotidine (DUEXIS) 800-26.6 MG TABS, will need DX code and office notes.

## 2017-09-23 ENCOUNTER — Ambulatory Visit (INDEPENDENT_AMBULATORY_CARE_PROVIDER_SITE_OTHER): Payer: BLUE CROSS/BLUE SHIELD | Admitting: Family Medicine

## 2017-09-23 ENCOUNTER — Telehealth: Payer: Self-pay | Admitting: Nurse Practitioner

## 2017-09-23 ENCOUNTER — Encounter: Payer: Self-pay | Admitting: Family Medicine

## 2017-09-23 ENCOUNTER — Telehealth: Payer: Self-pay

## 2017-09-23 ENCOUNTER — Other Ambulatory Visit: Payer: Self-pay

## 2017-09-23 DIAGNOSIS — M5010 Cervical disc disorder with radiculopathy, unspecified cervical region: Secondary | ICD-10-CM

## 2017-09-23 DIAGNOSIS — F32A Depression, unspecified: Secondary | ICD-10-CM

## 2017-09-23 DIAGNOSIS — F419 Anxiety disorder, unspecified: Principal | ICD-10-CM

## 2017-09-23 DIAGNOSIS — F329 Major depressive disorder, single episode, unspecified: Secondary | ICD-10-CM

## 2017-09-23 MED ORDER — ESCITALOPRAM OXALATE 10 MG PO TABS: 10.0000 mg | ORAL_TABLET | Freq: Every day | ORAL | 2 refills | Status: DC

## 2017-09-23 MED ORDER — VITAMIN D (ERGOCALCIFEROL) 1.25 MG (50000 UNIT) PO CAPS: 50000.0000 [IU] | ORAL_CAPSULE | ORAL | 0 refills | Status: DC

## 2017-09-23 NOTE — Telephone Encounter (Signed)
Patient came in today stating [facility] sent the wrong med----wellbutrin---however, I cant find this med on patient's chart, ashleigh sent in lexapro and receipt confirmed by pharmacy 09/09/17---so I have resent lexapro to [facility] per patient request

## 2017-09-23 NOTE — Telephone Encounter (Signed)
Patient is here to see Tamala Julian.  Patient states Wellbutrin was sent to her pharmacy.  Would like to know why she is taking this med?  States this was a change to her meds.

## 2017-09-23 NOTE — Telephone Encounter (Signed)
See other phone note----lexapro resent to [facility]

## 2017-09-23 NOTE — Assessment & Plan Note (Signed)
Severe degenerative disc disease and foraminal narrowing with central spinal stenosis as well noted on MRI.  Patient given home exercises for posture and ergonomics, encourage patient to take the gabapentin regularly, we discussed icing regimen, over-the-counter natural supplementations and once weekly vitamin D for strength and endurance.  Patient will try these different changes and follow-up with me again in 4 weeks.  Ergonomics discussed and will get adjustable standing desk for patient.  Worsening symptoms we will consider either osteopathic manipulation or epidural injections.

## 2017-09-23 NOTE — Patient Instructions (Addendum)
Good to see you  Ice 20 minutes 2 times daily. Usually after activity and before bed. Exercises 3 times a week.  Adjustable standing desk can help  Tennis ball between shoulder blades and chair when sitting a long time Get monitor at eye level Prescription of once weekly vitamin D for 12 weeks.  On wall with heels, butt shoulder and head touching for a goal of 5 minutes daily  Over the counter get  Turmeric 500mg  1-2 times daily  Tart cherry extract any dose (1200mg  is great) at night See me again in 4 weeks

## 2017-09-23 NOTE — Progress Notes (Signed)
[facility] [address] [place], [place] [address] Phone: [phone] Subjective:    I'm seeing this patient by the request  of:  Lance Sell, NP   CC: Neck pain  [id number]  Jennifer Waters is a 57 y.o. female coming in with complaint of neck pain and headache. Has had migraines since her mid 51s. Has been sitting for work since she started working. Upper traps. Has numbness and tingling in finger tips.   Onset- Chronic Location- Upper traps Duration-mostly constant but seems to get worse during the end of the day Character- Sharp Aggravating factors- Working (sits down all day), works on computers Reliving factors-  Therapies tried-gabapentin with mixed and benefit Severity-6 out of 10  Patient also had laboratory workup with a positive ANA with a 1-640 titer has been referred to rheumatology.   Spine July 30, 2017.  Independently visualized by me.  Found to have moderate central canal stenosis from C3 through C7 as well as foraminal narrowing most severe at C4-C5 on the right as well as C5-C6  Past Medical History:  Diagnosis Date  . Arthritis   . Hypertension   . Migraines    Past Surgical History:  Procedure Laterality Date  . ABDOMINAL HYSTERECTOMY     Social History   Socioeconomic History  . Marital status: Divorced    Spouse name: Not on file  . Number of children: 1  . Years of education: 56  . Highest education level: Not on file  Occupational History  . Occupation: Therapist, music  . Financial resource strain: Not on file  . Food insecurity:    Worry: Not on file    Inability: Not on file  . Transportation needs:    Medical: Not on file    Non-medical: Not on file  Tobacco Use  . Smoking status: Current Every Day Smoker    Packs/day: 0.50    Years: 20.00    Pack years: 10.00    Types: Cigarettes  . Smokeless tobacco: Never Used  Substance and Sexual Activity  . Alcohol use: Yes   Alcohol/week: 3.0 oz    Types: 5 Glasses of wine per week  . Drug use: No  . Sexual activity: Yes    Birth control/protection: Surgical  Lifestyle  . Physical activity:    Days per week: Not on file    Minutes per session: Not on file  . Stress: Not on file  Relationships  . Social connections:    Talks on phone: Not on file    Gets together: Not on file    Attends religious service: Not on file    Active member of club or organization: Not on file    Attends meetings of clubs or organizations: Not on file    Relationship status: Not on file  Other Topics Concern  . Not on file  Social History Narrative   Fun: Puzzles, cats   Denies any religious beliefs effecting health care. Lives with daughter in a one story home.  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  Education: some college.   No Known Allergies Family History  Problem Relation Age of Onset  . Arthritis Mother   . Ovarian cancer Mother   . Migraines Mother   . Lung cancer Father   . Arrhythmia Sister   . Brain cancer Brother   . Edema Sister   . Arthritis Sister   . Diabetes Brother  Past medical history, social, surgical and family history all reviewed in electronic medical record.  No pertanent information unless stated regarding to the chief complaint.   Review of Systems:Review of systems updated and as accurate as of 09/23/17  No  visual changes, nausea, vomiting, diarrhea, constipation, dizziness, abdominal pain, skin rash, fevers, chills, night sweats, weight loss, swollen lymph nodes, body aches, joint swelling, , chest pain, shortness of breath, mood changes.  Mild positive muscle aches and headaches  Objective  There were no vitals taken for this visit. Systems examined below as of 09/23/17   General: No apparent distress alert and oriented x3 mood and affect normal, dressed appropriately.  HEENT: Pupils equal, extraocular movements intact  Respiratory: Patient's speak in full sentences and does not  appear short of breath  Cardiovascular: No lower extremity edema, non tender, no erythema  Skin: Warm dry intact with no signs of infection or rash on extremities or on axial skeleton.  Abdomen: Soft nontender  Neuro: Cranial nerves II through XII are intact, neurovascularly intact in all extremities with 2+ DTRs and 2+ pulses.  Lymph: No lymphadenopathy of posterior or anterior cervical chain or axillae bilaterally.  Gait normal with good balance and coordination.  MSK:  Non tender with full range of motion and good stability and symmetric strength and tone of shoulders, elbows, wrist, hip, knee and ankles bilaterally.  Neck exam shows the patient does have some moderate loss of range of motion in all planes by 5-10 degrees.  Negative Spurling's today.  Grip strength is symmetric.  Patient does have some mild 4+ out of 5 weakness on the right in the C6 distribution compared to the contralateral side.  Patient is right-handed.  Deep tendon reflexes are intact and symmetric.  [id number]; 15 additional minutes spent for Therapeutic exercises as stated in above notes.  This included exercises focusing on stretching, strengthening, with significant focus on eccentric aspects.   Long term goals include an improvement in range of motion, strength, endurance as well as avoiding reinjury. Patient's frequency would include in 1-2 times a day, 3-5 times a week for a duration of 6-12 weeks. Exercises that included:  Basic scapular stabilization to include adduction and depression of scapula Scaption, focusing on proper movement and good control Internal and External rotation utilizing a theraband, with elbow tucked at side entire time Rows with theraband which was given   Proper technique shown and discussed handout in great detail with ATC.  All questions were discussed and answered.      Impression and Recommendations:     This case required medical decision making of moderate complexity.      Note:  This dictation was prepared with Dragon dictation along with smaller phrase technology. Any transcriptional errors that result from this process are unintentional.

## 2017-09-26 NOTE — Telephone Encounter (Signed)
Called Crystal gave clinical info for the duexis. Per Crystal will submit to insurance, and they will fax approval to the office.Marland KitchenJohny Chess

## 2017-10-21 ENCOUNTER — Ambulatory Visit: Payer: BLUE CROSS/BLUE SHIELD | Admitting: Family Medicine

## 2017-10-21 ENCOUNTER — Ambulatory Visit: Payer: BLUE CROSS/BLUE SHIELD | Admitting: Cardiology

## 2017-10-21 NOTE — Progress Notes (Deleted)
[name] [address] [place], [place] [address] Phone: [phone] Subjective:    I'm seeing this patient by the request  of:    CC:   [id number]  Jennifer Waters is a 57 y.o. female coming in with complaint of ***  Onset-  Location Duration-  Character- Aggravating factors- Reliving factors-  Therapies tried-  Severity-     Past Medical History:  Diagnosis Date  . Arthritis   . Hypertension   . Migraines    Past Surgical History:  Procedure Laterality Date  . ABDOMINAL HYSTERECTOMY     Social History   Socioeconomic History  . Marital status: Divorced    Spouse name: Not on file  . Number of children: 1  . Years of education: 62  . Highest education level: Not on file  Occupational History  . Occupation: Therapist, music  . Financial resource strain: Not on file  . Food insecurity:    Worry: Not on file    Inability: Not on file  . Transportation needs:    Medical: Not on file    Non-medical: Not on file  Tobacco Use  . Smoking status: Current Every Day Smoker    Packs/day: 0.50    Years: 20.00    Pack years: 10.00    Types: Cigarettes  . Smokeless tobacco: Never Used  Substance and Sexual Activity  . Alcohol use: Yes    Alcohol/week: 3.0 oz    Types: 5 Glasses of wine per week  . Drug use: No  . Sexual activity: Yes    Birth control/protection: Surgical  Lifestyle  . Physical activity:    Days per week: Not on file    Minutes per session: Not on file  . Stress: Not on file  Relationships  . Social connections:    Talks on phone: Not on file    Gets together: Not on file    Attends religious service: Not on file    Active member of club or organization: Not on file    Attends meetings of clubs or organizations: Not on file    Relationship status: Not on file  Other Topics Concern  . Not on file  Social History Narrative   Fun: Puzzles, cats   Denies any religious beliefs effecting  health care. Lives with daughter in a one story home.  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  Education: some college.   No Known Allergies Family History  Problem Relation Age of Onset  . Arthritis Mother   . Ovarian cancer Mother   . Migraines Mother   . Lung cancer Father   . Arrhythmia Sister   . Brain cancer Brother   . Edema Sister   . Arthritis Sister   . Diabetes Brother      Past medical history, social, surgical and family history all reviewed in electronic medical record.  No pertanent information unless stated regarding to the chief complaint.   Review of Systems:Review of systems updated and as accurate as of 10/21/17  No headache, visual changes, nausea, vomiting, diarrhea, constipation, dizziness, abdominal pain, skin rash, fevers, chills, night sweats, weight loss, swollen lymph nodes, body aches, joint swelling, muscle aches, chest pain, shortness of breath, mood changes.   Objective  There were no vitals taken for this visit. Systems examined below as of 10/21/17   General: No apparent distress alert and oriented x3 mood and affect normal, dressed appropriately.  HEENT: Pupils equal, extraocular movements  intact  Respiratory: Patient's speak in full sentences and does not appear short of breath  Cardiovascular: No lower extremity edema, non tender, no erythema  Skin: Warm dry intact with no signs of infection or rash on extremities or on axial skeleton.  Abdomen: Soft nontender  Neuro: Cranial nerves II through XII are intact, neurovascularly intact in all extremities with 2+ DTRs and 2+ pulses.  Lymph: No lymphadenopathy of posterior or anterior cervical chain or axillae bilaterally.  Gait normal with good balance and coordination.  MSK:  Non tender with full range of motion and good stability and symmetric strength and tone of shoulders, elbows, wrist, hip, knee and ankles bilaterally.     Impression and Recommendations:     This case required medical decision  making of moderate complexity.      Note: This dictation was prepared with Dragon dictation along with smaller phrase technology. Any transcriptional errors that result from this process are unintentional.

## 2017-10-24 ENCOUNTER — Encounter: Payer: Self-pay | Admitting: *Deleted

## 2017-11-04 ENCOUNTER — Ambulatory Visit (INDEPENDENT_AMBULATORY_CARE_PROVIDER_SITE_OTHER): Payer: BLUE CROSS/BLUE SHIELD | Admitting: Nurse Practitioner

## 2017-11-04 ENCOUNTER — Encounter: Payer: Self-pay | Admitting: Nurse Practitioner

## 2017-11-04 ENCOUNTER — Other Ambulatory Visit (INDEPENDENT_AMBULATORY_CARE_PROVIDER_SITE_OTHER): Payer: BLUE CROSS/BLUE SHIELD

## 2017-11-04 VITALS — BP 124/82 | HR 72 | Temp 98.2°F | Resp 16 | Ht 69.0 in | Wt 236.0 lb

## 2017-11-04 DIAGNOSIS — F419 Anxiety disorder, unspecified: Secondary | ICD-10-CM | POA: Diagnosis not present

## 2017-11-04 DIAGNOSIS — Z6834 Body mass index (BMI) 34.0-34.9, adult: Secondary | ICD-10-CM | POA: Diagnosis not present

## 2017-11-04 DIAGNOSIS — Z1211 Encounter for screening for malignant neoplasm of colon: Secondary | ICD-10-CM | POA: Diagnosis not present

## 2017-11-04 DIAGNOSIS — Z1159 Encounter for screening for other viral diseases: Secondary | ICD-10-CM

## 2017-11-04 DIAGNOSIS — Z114 Encounter for screening for human immunodeficiency virus [HIV]: Secondary | ICD-10-CM

## 2017-11-04 DIAGNOSIS — Z0001 Encounter for general adult medical examination with abnormal findings: Secondary | ICD-10-CM | POA: Diagnosis not present

## 2017-11-04 DIAGNOSIS — F329 Major depressive disorder, single episode, unspecified: Secondary | ICD-10-CM | POA: Diagnosis not present

## 2017-11-04 DIAGNOSIS — K219 Gastro-esophageal reflux disease without esophagitis: Secondary | ICD-10-CM

## 2017-11-04 DIAGNOSIS — Z720 Tobacco use: Secondary | ICD-10-CM | POA: Diagnosis not present

## 2017-11-04 DIAGNOSIS — Z0000 Encounter for general adult medical examination without abnormal findings: Secondary | ICD-10-CM | POA: Insufficient documentation

## 2017-11-04 DIAGNOSIS — Z13220 Encounter for screening for lipoid disorders: Secondary | ICD-10-CM | POA: Diagnosis not present

## 2017-11-04 DIAGNOSIS — I10 Essential (primary) hypertension: Secondary | ICD-10-CM

## 2017-11-04 DIAGNOSIS — F32A Depression, unspecified: Secondary | ICD-10-CM

## 2017-11-04 DIAGNOSIS — Z9189 Other specified personal risk factors, not elsewhere classified: Secondary | ICD-10-CM | POA: Diagnosis not present

## 2017-11-04 DIAGNOSIS — R55 Syncope and collapse: Secondary | ICD-10-CM

## 2017-11-04 DIAGNOSIS — Z1239 Encounter for other screening for malignant neoplasm of breast: Secondary | ICD-10-CM

## 2017-11-04 DIAGNOSIS — Z122 Encounter for screening for malignant neoplasm of respiratory organs: Secondary | ICD-10-CM

## 2017-11-04 DIAGNOSIS — Z1231 Encounter for screening mammogram for malignant neoplasm of breast: Secondary | ICD-10-CM

## 2017-11-04 LAB — CBC
HCT: 41.4 % (ref 36.0–46.0)
Hemoglobin: 14.1 g/dL (ref 12.0–15.0)
MCHC: 34 g/dL (ref 30.0–36.0)
MCV: 89.7 fl (ref 78.0–100.0)
Platelets: 257 10*3/uL (ref 150.0–400.0)
RBC: 4.61 Mil/uL (ref 3.87–5.11)
RDW: 13.3 % (ref 11.5–15.5)
WBC: 8.1 10*3/uL (ref 4.0–10.5)

## 2017-11-04 LAB — LIPID PANEL
CHOL/HDL RATIO: 4
Cholesterol: 187 mg/dL (ref 0–200)
HDL: 46.3 mg/dL (ref >39.00)
LDL Cholesterol: 110 mg/dL — ABNORMAL HIGH (ref 0–99)
NonHDL: 140.89
TRIGLYCERIDES: 155 mg/dL — AB (ref 0.0–149.0)
VLDL: 31 mg/dL (ref 0.0–40.0)

## 2017-11-04 LAB — MAGNESIUM: Magnesium: 2 mg/dL (ref 1.5–2.5)

## 2017-11-04 LAB — COMPREHENSIVE METABOLIC PANEL
ALT: 14 U/L (ref 0–35)
AST: 11 U/L (ref 0–37)
Albumin: 4.1 g/dL (ref 3.5–5.2)
Alkaline Phosphatase: 63 U/L (ref 39–117)
BUN: 14 mg/dL (ref 6–23)
CHLORIDE: 107 meq/L (ref 96–112)
CO2: 27 meq/L (ref 19–32)
Calcium: 9.6 mg/dL (ref 8.4–10.5)
Creatinine, Ser: 0.92 mg/dL (ref 0.40–1.20)
GFR: 81.22 mL/min (ref >60.00)
Glucose, Bld: 94 mg/dL (ref 70–99)
POTASSIUM: 4.1 meq/L (ref 3.5–5.1)
SODIUM: 140 meq/L (ref 135–145)
Total Bilirubin: 0.5 mg/dL (ref 0.2–1.2)
Total Protein: 6.9 g/dL (ref 6.0–8.3)

## 2017-11-04 LAB — HEMOGLOBIN A1C: Hgb A1c MFr Bld: 5.5 % (ref 4.6–6.5)

## 2017-11-04 MED ORDER — ESCITALOPRAM OXALATE 20 MG PO TABS: 10.0000 mg | ORAL_TABLET | Freq: Every day | ORAL | 1 refills | Status: DC

## 2017-11-04 NOTE — Assessment & Plan Note (Signed)
Will increase lexapro dosage due to continued symptoms dosage-dosing and side effects discussed  RTC in 1 month for F/U, or sooner for new, worsening symptoms - escitalopram (LEXAPRO) 20 MG tablet; Take 0.5 tablets (10 mg total) by mouth daily.  Dispense: 30 tablet; Refill: 1

## 2017-11-04 NOTE — Assessment & Plan Note (Signed)
-  USPSTF grade A and B recommendations reviewed with patient; age-appropriate recommendations, preventive care, screening tests, etc discussed and encouraged; healthy living and sunscreen use encouraged; see AVS for patient education given to patient. Advanced directives packet provided  -Discussed importance of 150 minutes of physical activity weekly, eat 6 servings of fruit/vegetables daily and drink plenty of water and avoid sweet beverages.  -Red flags and when to present for emergency care or RTC including fever >101.87F, chest pain, shortness of breath, new/worsening/un-resolving symptoms, reviewed with patient at time of visit. Follow up and care instructions discussed and provided in AVS.  -Reviewed Health Maintenance:   - Lipid panel; Future-Screening for cholesterol level - HIV antibody; Future-Screening for HIV (human immunodeficiency virus) - Hepatitis C antibody; Future-Encounter for hepatitis C virus screening test for high risk patient - MM DIGITAL SCREENING BILATERAL; Future-Screening for breast cancer - Ambulatory referral to Gastroenterology-Screening for colon cancer - Ambulatory Referral for Lung Cancer Scre-Encounter for screening for lung cancer  BMI 34.0-34.9,adult - Hemoglobin A1c; Future

## 2017-11-04 NOTE — Assessment & Plan Note (Signed)
Asked about tobacco use. She was advised to quit. Assess willingness to make an attempt: She is motivated and ready to quit. Assist in quit attempt: she has chantix rx Arrange follow up: She will RTC in about 1 month to F/U

## 2017-11-04 NOTE — Patient Instructions (Addendum)
Please head downstairs for lab work/x-rays. If any of your test results are critically abnormal, you will be contacted right away. Your results may be released to your MyChart for viewing before I am able to provide you with my response. I will contact you within a week about your test results and any recommendations for abnormalities.  Please work on your diet and exercise.. Remember half of your plate should be veggies, one-fourth carbs, one-fourth meat, and don't eat meat at every meal. Also, remember to stay away from sugary drinks. I'd like for you to start incorporating exercise into your daily schedule. Start at 10 minutes a day, working up to 30 minutes five times a week.   I have placed a referral to pulmonology for lung cancer screening, GI for colon cancer screening, breast center for breast cancer screening, and an order for a CT scan of your head. Our office will call you to schedule this appointment. You should hear from our office in 7-10 days.  Please schedule neurology follow up as we have discussed  Increase your lexapro to 20 mg once daily. 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  Return in about 1 month so I can see how you are doing.   Health Maintenance, Female Adopting a healthy lifestyle and getting preventive care can go a long way to promote health and wellness. Talk with your health care provider about what schedule of regular examinations is right for you. This is a good chance for you to check in with your provider about disease prevention and staying healthy. In between checkups, there are plenty of things you can do on your own. Experts have done a lot of research about which lifestyle changes and preventive measures are most likely to keep you healthy. Ask your health care provider for more information. Weight and diet Eat a healthy diet  Be sure to include plenty of vegetables, fruits, low-fat dairy products, and lean protein.  Do not eat a lot of  foods high in solid fats, added sugars, or salt.  Get regular exercise. This is one of the most important things you can do for your health. ? Most adults should exercise for at least 150 minutes each week. The exercise should increase your heart rate and make you sweat (moderate-intensity exercise). ? Most adults should also do strengthening exercises at least twice a week. This is in addition to the moderate-intensity exercise.  Maintain a healthy weight  Body mass index (BMI) is a measurement that can be used to identify possible weight problems. It estimates body fat based on height and weight. Your health care provider can help determine your BMI and help you achieve or maintain a healthy weight.  For females 71 years of age and older: ? A BMI below 18.5 is considered underweight. ? A BMI of 18.5 to 24.9 is normal. ? A BMI of 25 to 29.9 is considered overweight. ? A BMI of 30 and above is considered obese.  Watch levels of cholesterol and blood lipids  You should start having your blood tested for lipids and cholesterol at 56 years of age, then have this test every 5 years.  You may need to have your cholesterol levels checked more often if: ? Your lipid or cholesterol levels are high. ? You are older than 56 years of age. ? You are at high risk for heart disease.  Cancer screening Lung Cancer  Lung cancer screening is recommended for adults 18-74 years old who are at high  risk for lung cancer because of a history of smoking.  A yearly low-dose CT scan of the lungs is recommended for people who: ? Currently smoke. ? Have quit within the past 15 years. ? Have at least a 30-pack-year history of smoking. A pack year is smoking an average of one pack of cigarettes a day for 1 year.  Yearly screening should continue until it has been 15 years since you quit.  Yearly screening should stop if you develop a health problem that would prevent you from having lung cancer  treatment.  Breast Cancer  Practice breast self-awareness. This means understanding how your breasts normally appear and feel.  It also means doing regular breast self-exams. Let your health care provider know about any changes, no matter how small.  If you are in your 20s or 30s, you should have a clinical breast exam (CBE) by a health care provider every 1-3 years as part of a regular health exam.  If you are 79 or older, have a CBE every year. Also consider having a breast X-ray (mammogram) every year.  If you have a family history of breast cancer, talk to your health care provider about genetic screening.  If you are at high risk for breast cancer, talk to your health care provider about having an MRI and a mammogram every year.  Breast cancer gene (BRCA) assessment is recommended for women who have family members with BRCA-related cancers. BRCA-related cancers include: ? Breast. ? Ovarian. ? Tubal. ? Peritoneal cancers.  Results of the assessment will determine the need for genetic counseling and BRCA1 and BRCA2 testing.  Cervical Cancer Your health care provider may recommend that you be screened regularly for cancer of the pelvic organs (ovaries, uterus, and vagina). This screening involves a pelvic examination, including checking for microscopic changes to the surface of your cervix (Pap test). You may be encouraged to have this screening done every 3 years, beginning at age 55.  For women ages 57-65, health care providers may recommend pelvic exams and Pap testing every 3 years, or they may recommend the Pap and pelvic exam, combined with testing for human papilloma virus (HPV), every 5 years. Some types of HPV increase your risk of cervical cancer. Testing for HPV may also be done on women of any age with unclear Pap test results.  Other health care providers may not recommend any screening for nonpregnant women who are considered low risk for pelvic cancer and who do not have  symptoms. Ask your health care provider if a screening pelvic exam is right for you.  If you have had past treatment for cervical cancer or a condition that could lead to cancer, you need Pap tests and screening for cancer for at least 20 years after your treatment. If Pap tests have been discontinued, your risk factors (such as having a new sexual partner) need to be reassessed to determine if screening should resume. Some women have medical problems that increase the chance of getting cervical cancer. In these cases, your health care provider may recommend more frequent screening and Pap tests.  Colorectal Cancer  This type of cancer can be detected and often prevented.  Routine colorectal cancer screening usually begins at 56 years of age and continues through 55 years of age.  Your health care provider may recommend screening at an earlier age if you have risk factors for colon cancer.  Your health care provider may also recommend using home test kits to check for hidden  blood in the stool.  A small camera at the end of a tube can be used to examine your colon directly (sigmoidoscopy or colonoscopy). This is done to check for the earliest forms of colorectal cancer.  Routine screening usually begins at age 2.  Direct examination of the colon should be repeated every 5-10 years through 56 years of age. However, you may need to be screened more often if early forms of precancerous polyps or small growths are found.  Skin Cancer  Check your skin from head to toe regularly.  Tell your health care provider about any new moles or changes in moles, especially if there is a change in a mole's shape or color.  Also tell your health care provider if you have a mole that is larger than the size of a pencil eraser.  Always use sunscreen. Apply sunscreen liberally and repeatedly throughout the day.  Protect yourself by wearing long sleeves, pants, a wide-brimmed hat, and sunglasses whenever you  are outside.  Heart disease, diabetes, and high blood pressure  High blood pressure causes heart disease and increases the risk of stroke. High blood pressure is more likely to develop in: ? People who have blood pressure in the high end of the normal range (130-139/85-89 mm Hg). ? People who are overweight or obese. ? People who are African American.  If you are 50-77 years of age, have your blood pressure checked every 3-5 years. If you are 9 years of age or older, have your blood pressure checked every year. You should have your blood pressure measured twice-once when you are at a hospital or clinic, and once when you are not at a hospital or clinic. Record the average of the two measurements. To check your blood pressure when you are not at a hospital or clinic, you can use: ? An automated blood pressure machine at a pharmacy. ? A home blood pressure monitor.  If you are between 61 years and 65 years old, ask your health care provider if you should take aspirin to prevent strokes.  Have regular diabetes screenings. This involves taking a blood sample to check your fasting blood sugar level. ? If you are at a normal weight and have a low risk for diabetes, have this test once every three years after 56 years of age. ? If you are overweight and have a high risk for diabetes, consider being tested at a younger age or more often. Preventing infection Hepatitis B  If you have a higher risk for hepatitis B, you should be screened for this virus. You are considered at high risk for hepatitis B if: ? You were born in a country where hepatitis B is common. Ask your health care provider which countries are considered high risk. ? Your parents were born in a high-risk country, and you have not been immunized against hepatitis B (hepatitis B vaccine). ? You have HIV or AIDS. ? You use needles to inject street drugs. ? You live with someone who has hepatitis B. ? You have had sex with someone who  has hepatitis B. ? You get hemodialysis treatment. ? You take certain medicines for conditions, including cancer, organ transplantation, and autoimmune conditions.  Hepatitis C  Blood testing is recommended for: ? Everyone born from [birth year] through 1964. ? Anyone with known risk factors for hepatitis C.  Sexually transmitted infections (STIs)  You should be screened for sexually transmitted infections (STIs) including gonorrhea and chlamydia if: ? You are sexually active  and are younger than 56 years of age. ? You are older than 56 years of age and your health care provider tells you that you are at risk for this type of infection. ? Your sexual activity has changed since you were last screened and you are at an increased risk for chlamydia or gonorrhea. Ask your health care provider if you are at risk.  If you do not have HIV, but are at risk, it may be recommended that you take a prescription medicine daily to prevent HIV infection. This is called pre-exposure prophylaxis (PrEP). You are considered at risk if: ? You are sexually active and do not regularly use condoms or know the HIV status of your partner(s). ? You take drugs by injection. ? You are sexually active with a partner who has HIV.  Talk with your health care provider about whether you are at high risk of being infected with HIV. If you choose to begin PrEP, you should first be tested for HIV. You should then be tested every 3 months for as long as you are taking PrEP. Pregnancy  If you are premenopausal and you may become pregnant, ask your health care provider about preconception counseling.  If you may become pregnant, take 400 to 800 micrograms (mcg) of folic acid every day.  If you want to prevent pregnancy, talk to your health care provider about birth control (contraception). Osteoporosis and menopause  Osteoporosis is a disease in which the bones lose minerals and strength with aging. This can result in serious bone  fractures. Your risk for osteoporosis can be identified using a bone density scan.  If you are 43 years of age or older, or if you are at risk for osteoporosis and fractures, ask your health care provider if you should be screened.  Ask your health care provider whether you should take a calcium or vitamin D supplement to lower your risk for osteoporosis.  Menopause may have certain physical symptoms and risks.  Hormone replacement therapy may reduce some of these symptoms and risks. Talk to your health care provider about whether hormone replacement therapy is right for you. Follow these instructions at home:  Schedule regular health, dental, and eye exams.  Stay current with your immunizations.  Do not use any tobacco products including cigarettes, chewing tobacco, or electronic cigarettes.  If you are pregnant, do not drink alcohol.  If you are breastfeeding, limit how much and how often you drink alcohol.  Limit alcohol intake to no more than 1 drink per day for nonpregnant women. One drink equals 12 ounces of beer, 5 ounces of wine, or 1 ounces of hard liquor.  Do not use street drugs.  Do not share needles.  Ask your health care provider for help if you need support or information about quitting drugs.  Tell your health care provider if you often feel depressed.  Tell your health care provider if you have ever been abused or do not feel safe at home. This information is not intended to replace advice given to you by your health care provider. Make sure you discuss any questions you have with your health care provider. Document Released: 12/21/2010 Document Revised: 11/13/2015 Document Reviewed: 03/11/2015 Elsevier Interactive Patient Education  [employer].

## 2017-11-04 NOTE — Assessment & Plan Note (Signed)
Stable, continue current medications - CBC; Future - Comprehensive metabolic panel; Future

## 2017-11-04 NOTE — Assessment & Plan Note (Signed)
Maintained on PPI Update magnesium - Magnesium; Future

## 2017-11-04 NOTE — Progress Notes (Signed)
Name: Jennifer Waters   MRN: [id number]    DOB: 1961-06-10   Date:11/04/2017       Progress Note  Subjective  Chief Complaint  Chief Complaint  Patient presents with  . Follow-up    hypertension    HPI  Patient presents for annual CPE.  Diet: not following regular diet Exercise: no routine exercise  USPSTF grade A and B recommendations  Depression: maintained on lexapro 10 with no noted adverse effects Does experience frequent job and home stress, has noted increased anxiety and depression recently and would like to adjust her medication Denies thoughts of hurting herself or others  Depression screen [facility] 2/9 09/09/2017 06/03/2017  Decreased Interest 1 2  Down, Depressed, Hopeless 1 3  PHQ - 2 Score 2 5  Altered sleeping 1 3  Tired, decreased energy 3 3  Change in appetite 3 3  Feeling bad or failure about yourself  0 3  Trouble concentrating 0 1  Moving slowly or fidgety/restless 0 0  Suicidal thoughts 0 1  PHQ-9 Score 9 19   Hypertension: BP Readings from Last 3 Encounters:  11/04/17 124/82  09/23/17 118/82  09/09/17 122/72   Obesity: Wt Readings from Last 3 Encounters:  11/04/17 236 lb (107 kg)  09/23/17 234 lb (106.1 kg)  09/09/17 239 lb (108.4 kg)   BMI Readings from Last 3 Encounters:  11/04/17 34.85 kg/m  09/23/17 34.56 kg/m  09/09/17 35.29 kg/m    Alcohol: social Tobacco use: current smoker, goal is to quit on [birth year]- her birthday, plans to start chantix soon- was given at last office visit   HIV, hep C: screening ordered today STD testing and prevention (chl/gon/syphilis): no concerns, declines Intimate partner violence: denies, feels safe  Menstrual History/LMP/Abnormal Bleeding: s/p hysterectomy, no abnormal bleeding or pain Incontinence Symptoms: no concerns  Vaccinations: up to date  Advanced Care Planning: A voluntary discussion about advance care planning including the explanation and discussion of advance directives.  Discussed  health care proxy and Living will, and the patient DOES NOT have a living will at present time. If patient does have living will, I have requested they bring this to the clinic to be scanned in to their chart.  Breast cancer: screening mammo ordered today Cervical cancer screening: s/p total hysterectomy  Lipids:  No results found for: CHOL No results found for: HDL No results found for: LDLCALC No results found for: TRIG No results found for: CHOLHDL No results found for: LDLDIRECT  Glucose:  Glucose, Bld  Date Value Ref Range Status  06/03/2017 99 70 - 99 mg/dL Final  10/17/2014 105 (H) 70 - 99 mg/dL Final  01/29/2008 87  Final    Skin cancer: no concerning lesions or moles, wears sunscreen Colorectal cancer: No personal or family history of colon ca, no abdominal pain, no bowel changes, no rectal bleeding. Referral for screening today.  Lung cancer: referral for screening totday  Aspirin: not indicated ECG: not indicated   Patient Active Problem List   Diagnosis Date Noted  . Tobacco abuse 07/08/2017  . Anxiety and depression 11/22/2016  . Cervical disc disorder with radiculopathy of cervical region 05/04/2016  . Cough 03/15/2016  . Snoring 12/11/2015  . GERD (gastroesophageal reflux disease) 12/11/2015  . Allergic rhinitis 05/01/2015  . Osteoarthritis 10/17/2014  . Essential hypertension 10/17/2014    Past Surgical History:  Procedure Laterality Date  . ABDOMINAL HYSTERECTOMY      Family History  Problem Relation Age of Onset  .  Arthritis Mother   . Ovarian cancer Mother   . Migraines Mother   . Lung cancer Father   . Arrhythmia Sister   . Brain cancer Brother   . Edema Sister   . Arthritis Sister   . Diabetes Brother     Social History   Socioeconomic History  . Marital status: Divorced    Spouse name: Not on file  . Number of children: 1  . Years of education: 59  . Highest education level: Not on file  Occupational History  . Occupation:  Therapist, music  . Financial resource strain: Not on file  . Food insecurity:    Worry: Not on file    Inability: Not on file  . Transportation needs:    Medical: Not on file    Non-medical: Not on file  Tobacco Use  . Smoking status: Current Every Day Smoker    Packs/day: 0.50    Years: 20.00    Pack years: 10.00    Types: Cigarettes  . Smokeless tobacco: Never Used  Substance and Sexual Activity  . Alcohol use: Yes    Alcohol/week: 3.0 oz    Types: 5 Glasses of wine per week  . Drug use: No  . Sexual activity: Yes    Birth control/protection: Surgical  Lifestyle  . Physical activity:    Days per week: Not on file    Minutes per session: Not on file  . Stress: Not on file  Relationships  . Social connections:    Talks on phone: Not on file    Gets together: Not on file    Attends religious service: Not on file    Active member of club or organization: Not on file    Attends meetings of clubs or organizations: Not on file    Relationship status: Not on file  . Intimate partner violence:    Fear of current or ex partner: Not on file    Emotionally abused: Not on file    Physically abused: Not on file    Forced sexual activity: Not on file  Other Topics Concern  . Not on file  Social History Narrative   Fun: Puzzles, cats   Denies any religious beliefs effecting health care. Lives with daughter in a one story home.  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  Education: some college.     Current Outpatient Medications:  .  albuterol (PROVENTIL HFA;VENTOLIN HFA) 108 (90 Base) MCG/ACT inhaler, Inhale 2 puffs into the lungs every 6 (six) hours as needed for wheezing or shortness of breath., Disp: 1 Inhaler, Rfl: 0 .  amLODipine (NORVASC) 10 MG tablet, Take 1 tablet (10 mg total) by mouth daily., Disp: 90 tablet, Rfl: 1 .  cetirizine (ZYRTEC) 10 MG tablet, Take 1 tablet (10 mg total) by mouth daily., Disp: 90 tablet, Rfl: 0 .  escitalopram (LEXAPRO) 10 MG tablet, Take 1  tablet (10 mg total) by mouth daily., Disp: 90 tablet, Rfl: 2 .  fluticasone (FLONASE) 50 MCG/ACT nasal spray, Place 2 sprays into both nostrils daily., Disp: 16 g, Rfl: 6 .  gabapentin (NEURONTIN) 300 MG capsule, Take 1 capsule (300 mg total) by mouth 3 (three) times daily., Disp: 90 capsule, Rfl: 0 .  Ibuprofen-Famotidine (DUEXIS) 800-26.6 MG TABS, Take 1 tablet by mouth 3 (three) times daily as needed., Disp: 90 tablet, Rfl: 0 .  Nebivolol HCl (BYSTOLIC) 20 MG TABS, Take 1 tablet (20 mg total) by mouth daily., Disp: 90 tablet, Rfl: 0 .  omeprazole (PRILOSEC) 20 MG capsule, TAKE 1 CAPSULE BY MOUTH EVERY DAY, Disp: 90 capsule, Rfl: 0 .  valACYclovir (VALTREX) 500 MG tablet, Take 1 tablet (500 mg total) by mouth 2 (two) times daily. (Patient taking differently: Take 500 mg by mouth 2 (two) times daily. As needed outbreak), Disp: 6 tablet, Rfl: 3 .  varenicline (CHANTIX CONTINUING MONTH PAK) 1 MG tablet, Take 1 tablet (1 mg total) by mouth 2 (two) times daily., Disp: 60 tablet, Rfl: 2 .  varenicline (CHANTIX) 0.5 MG tablet, Take 1 tablet (0.5 mg total) by mouth 2 (two) times daily., Disp: 11 tablet, Rfl: 0 .  Vitamin D, Ergocalciferol, (DRISDOL) 50000 units CAPS capsule, Take 1 capsule (50,000 Units total) by mouth every 7 (seven) days., Disp: 12 capsule, Rfl: 0  No Known Allergies   ROS  Constitutional: Negative for fever or weight change.  Respiratory: Negative for cough and shortness of breath.   Cardiovascular: Negative for chest pain or palpitations.  Gastrointestinal: Negative for abdominal pain, no bowel changes.  Musculoskeletal: Negative for gait problem or joint swelling.  Skin: Negative for rash.  Neurological: Negative for dizziness. No other specific complaints in a complete review of systems (except as listed in HPI above).  Syncope- She reports a ?syncopal event that occurred 1 month ago She was in her apartment which was very hot and A/c was not working, she thinks she  passed out and hit her head She has had no further episodes since Denies vision changes, weakness, confusion, slurred speech, tremor She does have chronic headaches which have not changed  Objective  Vitals:   11/04/17 0918  BP: 124/82  Pulse: 72  Resp: 16  Temp: 98.2 F (36.8 C)  TempSrc: Oral  SpO2: 93%  Weight: 236 lb (107 kg)  Height: 5\' 9"  (1.753 m)    Body mass index is 34.85 kg/m.  Physical Exam Vital signs reviewed. Constitutional: Patient appears well-developed and well-nourished. No distress.  HENT: Head: Normocephalic and atraumatic. Ears: B TMs ok, no erythema or effusion; Nose: Nose normal. Mouth/Throat: Oropharynx is clear and moist. No oropharyngeal exudate.  Eyes: Conjunctivae and EOM are normal. Pupils are equal, round, and reactive to light. No scleral icterus.  Neck: Normal range of motion. Neck supple. No cervical adenopathy. No thyromegaly present.  Cardiovascular: Normal rate, regular rhythm and normal heart sounds.  No murmur heard. No BLE edema. Pulmonary/Chest: Effort normal and breath sounds normal. No respiratory distress. Abdominal: Soft. Bowel sounds are normal, no distension. There is no tenderness. no masses Musculoskeletal: Normal range of motion, no joint effusions. No gross deformities Neurological: she is alert and oriented to person, place, and time. No cranial nerve deficit. Coordination, balance, strength, speech and gait are normal.  Skin: Skin is warm and dry. No rash noted. No erythema.  Psychiatric: Patient has a normal mood and affect. behavior is normal. Judgment and thought content normal.   PHQ2/9: Depression screen [facility] 2/9 09/09/2017 06/03/2017  Decreased Interest 1 2  Down, Depressed, Hopeless 1 3  PHQ - 2 Score 2 5  Altered sleeping 1 3  Tired, decreased energy 3 3  Change in appetite 3 3  Feeling bad or failure about yourself  0 3  Trouble concentrating 0 1  Moving slowly or fidgety/restless 0 0  Suicidal thoughts 0 1   PHQ-9 Score 9 19    Fall Risk: Fall Risk  02/06/2016 08/01/2015  Falls in the past year? No No    Assessment & Plan  RTC  in 2 weeks for F/U: Anxiety and depression- increasing lexapro, Nicotine dependence- starting chantix, quit date May 30  Syncope, unspecified syncope type VS and PE stable today Return precautions including when to seek emergency care discussed during visit Will obtain CT head for syncope and continued headaches Instructed to schedule neurology F/U as we have discussed at prior appointments - CBC; Future - Comprehensive metabolic panel; Future - CT Head Wo Contrast; Future

## 2017-11-05 LAB — HEPATITIS C ANTIBODY
Hepatitis C Ab: NONREACTIVE
SIGNAL TO CUT-OFF: 0.01 (ref <1.00)

## 2017-11-05 LAB — HIV ANTIBODY (ROUTINE TESTING W REFLEX): HIV: NONREACTIVE

## 2017-11-11 ENCOUNTER — Inpatient Hospital Stay: Admission: RE | Admit: 2017-11-11 | Payer: BLUE CROSS/BLUE SHIELD | Source: Ambulatory Visit

## 2017-11-18 ENCOUNTER — Encounter: Payer: Self-pay | Admitting: Nurse Practitioner

## 2017-12-01 NOTE — Progress Notes (Signed)
[name] [address], [place] [address] Phone: [phone] Subjective:     CC: neck pain   [id number]  Jennifer Waters is a 57 y.o. female coming in with complaint of neck pain. She is doing better but still is having pain with movements throughout the day. She has been trying to do her exercises. Was unable to get a stand up desk as her work will not pay for one because she works from home.  Overall the patient thinks that she is about 20 to 30% better.  No radiation down the arms.  No numbness or weakness.  Headaches seem to be getting a little bit better and not quite as severe   Past Medical History:  Diagnosis Date  . Arthritis   . Hypertension   . Migraines    Past Surgical History:  Procedure Laterality Date  . ABDOMINAL HYSTERECTOMY     Social History   Socioeconomic History  . Marital status: Divorced    Spouse name: Not on file  . Number of children: 1  . Years of education: 24  . Highest education level: Not on file  Occupational History  . Occupation: Therapist, music  . Financial resource strain: Not on file  . Food insecurity:    Worry: Not on file    Inability: Not on file  . Transportation needs:    Medical: Not on file    Non-medical: Not on file  Tobacco Use  . Smoking status: Current Every Day Smoker    Packs/day: 0.50    Years: 20.00    Pack years: 10.00    Types: Cigarettes  . Smokeless tobacco: Never Used  Substance and Sexual Activity  . Alcohol use: Yes    Alcohol/week: 3.0 oz    Types: 5 Glasses of wine per week  . Drug use: No  . Sexual activity: Yes    Birth control/protection: Surgical  Lifestyle  . Physical activity:    Days per week: Not on file    Minutes per session: Not on file  . Stress: Not on file  Relationships  . Social connections:    Talks on phone: Not on file    Gets together: Not on file    Attends religious service: Not on file    Active member of club  or organization: Not on file    Attends meetings of clubs or organizations: Not on file    Relationship status: Not on file  Other Topics Concern  . Not on file  Social History Narrative   Fun: Puzzles, cats   Denies any religious beliefs effecting health care. Lives with daughter in a one story home.  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  Education: some college.   No Known Allergies Family History  Problem Relation Age of Onset  . Arthritis Mother   . Ovarian cancer Mother   . Migraines Mother   . Lung cancer Father   . Arrhythmia Sister   . Brain cancer Brother   . Edema Sister   . Arthritis Sister   . Diabetes Brother      Past medical history, social, surgical and family history all reviewed in electronic medical record.  No pertanent information unless stated regarding to the chief complaint.   Review of Systems:Review of systems updated and as accurate as of 12/02/17  No  visual changes, nausea, vomiting, diarrhea, constipation, dizziness, abdominal pain, skin rash, fevers, chills, night sweats, weight loss,  swollen lymph nodes, body aches, joint swelling, muscle aches, chest pain, shortness of breath, mood changes.  Positive headaches  Objective  Blood pressure 122/88, pulse 91, height 5\' 9"  (1.753 m), weight 237 lb (107.5 kg), SpO2 91 %. Systems examined below as of 12/02/17   General: No apparent distress alert and oriented x3 mood and affect normal, dressed appropriately.  HEENT: Pupils equal, extraocular movements intact  Respiratory: Patient's speak in full sentences and does not appear short of breath  Cardiovascular: No lower extremity edema, non tender, no erythema  Skin: Warm dry intact with no signs of infection or rash on extremities or on axial skeleton.  Abdomen: Soft nontender  Neuro: Cranial nerves II through XII are intact, neurovascularly intact in all extremities with 2+ DTRs and 2+ pulses.  Lymph: No lymphadenopathy of posterior or anterior cervical  chain or axillae bilaterally.  Gait normal with good balance and coordination.  MSK:  Non tender with full range of motion and good stability and symmetric strength and tone of shoulders, elbows, wrist, hip, knee and ankles bilaterally.  Neck: Inspection mild loss of lordosis. No palpable stepoffs. Negative Spurling's maneuver. Patient does have some mild decrease in range of motion lacking last 5 degrees of sidebending and rotation bilaterally Grip strength and sensation normal in bilateral hands Strength good C4 to T1 distribution No sensory change to C4 to T1 Negative Hoffman sign bilaterally Reflexes normal Tightness of the trapezius bilaterally  Osteopathic findings C2 flexed rotated and side bent right C4 flexed rotated and side bent left C6 flexed rotated and side bent left T3 extended rotated and side bent right inhaled third rib     Impression and Recommendations:     This case required medical decision making of moderate complexity.      Note: This dictation was prepared with Dragon dictation along with smaller phrase technology. Any transcriptional errors that result from this process are unintentional.

## 2017-12-02 ENCOUNTER — Ambulatory Visit (INDEPENDENT_AMBULATORY_CARE_PROVIDER_SITE_OTHER): Payer: BLUE CROSS/BLUE SHIELD | Admitting: Family Medicine

## 2017-12-02 ENCOUNTER — Encounter: Payer: Self-pay | Admitting: Family Medicine

## 2017-12-02 DIAGNOSIS — M999 Biomechanical lesion, unspecified: Secondary | ICD-10-CM | POA: Insufficient documentation

## 2017-12-02 DIAGNOSIS — M5010 Cervical disc disorder with radiculopathy, unspecified cervical region: Secondary | ICD-10-CM | POA: Diagnosis not present

## 2017-12-02 NOTE — Assessment & Plan Note (Signed)
Start manipulation.  Patient did tolerate the procedure well.  We discussed continuing vitamin supplementation.  Discussed ergonomics and home exercise.  Follow-up again 4 weeks

## 2017-12-02 NOTE — Assessment & Plan Note (Addendum)
Decision today to treat with OMT was based on Physical Exam  After verbal consent patient was treated with HVLA, ME, FPR techniques in cervical, thoracic, rib areas  Patient tolerated the procedure well with improvement in symptoms  Patient given exercises, stretches and lifestyle modifications  See medications in patient instructions if given  Patient will follow up in 4 weeks 

## 2017-12-02 NOTE — Patient Instructions (Addendum)
Good to see you  This may take some time.  You are doing well.  Keep being active Overall I am impressed See me again in 4 weeks

## 2017-12-08 ENCOUNTER — Encounter: Payer: Self-pay | Admitting: Nurse Practitioner

## 2017-12-16 ENCOUNTER — Encounter: Payer: Self-pay | Admitting: Nurse Practitioner

## 2017-12-16 ENCOUNTER — Ambulatory Visit (INDEPENDENT_AMBULATORY_CARE_PROVIDER_SITE_OTHER): Payer: BLUE CROSS/BLUE SHIELD | Admitting: Nurse Practitioner

## 2017-12-16 ENCOUNTER — Other Ambulatory Visit: Payer: Self-pay | Admitting: Family Medicine

## 2017-12-16 VITALS — BP 140/78 | HR 92 | Ht 69.0 in | Wt 238.0 lb

## 2017-12-16 DIAGNOSIS — F419 Anxiety disorder, unspecified: Secondary | ICD-10-CM

## 2017-12-16 DIAGNOSIS — R55 Syncope and collapse: Secondary | ICD-10-CM | POA: Diagnosis not present

## 2017-12-16 DIAGNOSIS — Z720 Tobacco use: Secondary | ICD-10-CM | POA: Diagnosis not present

## 2017-12-16 DIAGNOSIS — F329 Major depressive disorder, single episode, unspecified: Secondary | ICD-10-CM | POA: Diagnosis not present

## 2017-12-16 DIAGNOSIS — F32A Depression, unspecified: Secondary | ICD-10-CM

## 2017-12-16 MED ORDER — BUPROPION HCL ER (XL) 150 MG PO TB24: 150.0000 mg | ORAL_TABLET | Freq: Every day | ORAL | 2 refills | Status: DC

## 2017-12-16 MED ORDER — ESCITALOPRAM OXALATE 20 MG PO TABS: 20.0000 mg | ORAL_TABLET | Freq: Every day | ORAL | 1 refills | Status: DC

## 2017-12-16 NOTE — Progress Notes (Signed)
Name: Jennifer Waters   MRN: [id number]    DOB: 1960/10/23   Date:12/16/2017       Progress Note  Subjective  Chief Complaint  Chief Complaint  Patient presents with  . Follow-up    HPI  Jennifer Waters is here today for a follow up visit. She was started on chantix and her lexapro dosage was increased at her last OV on 11/04/17, and she returns today for follow up. 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 She would like to continue this dosage She denies thoughts of hurting herself or others  Nicotine dependence-  Trial of chantix was started at CPE for smoking cessation She tried the chantix for about 2 weeks but it made her very nauseated so she stopped taking it and the nausea resolved. She continues to smoke, about 1 ppd, mostly while she is working due to job stress she feels the desire to smoke cigarettes while dealing with customer complaints She spent $1800 on cigarettes last year and really wants to quit She is interested in another smoking cessation option today  Syncope- Reported a questionable syncope event 1 month prior to her CPE on 11/04/17. Workup was ordered including labs which were all stable and a CT scan of her head, which she did not schedule. She says she has had no further syncope and overall feels well and did not see the need to have the CT scan done. Denies vision changes, weakness, confusion, slurred speech, tremor, chest pain, shortness of breath. Denies seizure history.   Patient Active Problem List   Diagnosis Date Noted  . Nonallopathic  lesion of cervical region 12/02/2017  . Nonallopathic lesion of thoracic region 12/02/2017  . Nonallopathic lesion of rib cage 12/02/2017  . Encounter for general adult medical examination with abnormal findings 11/04/2017  . Tobacco abuse 07/08/2017  . Anxiety and depression 11/22/2016  . Cervical disc disorder with radiculopathy of cervical region 05/04/2016  . Cough 03/15/2016  . Snoring 12/11/2015  . GERD (gastroesophageal reflux disease) 12/11/2015  . Allergic rhinitis 05/01/2015  . Osteoarthritis 10/17/2014  . Essential hypertension 10/17/2014    Past Surgical History:  Procedure Laterality Date  . ABDOMINAL HYSTERECTOMY      Family History  Problem Relation Age of Onset  . Arthritis Mother   . Ovarian cancer Mother   . Migraines Mother   . Lung cancer Father   . Arrhythmia Sister   . Brain cancer Brother   . Edema Sister   . Arthritis Sister   . Diabetes Brother     Social History   Socioeconomic History  . Marital status: Divorced    Spouse name: Not on file  . Number of children: 1  . Years of education: 52  . Highest education level: Not on file  Occupational History  . Occupation: Therapist, music  . Financial resource strain: Not on file  . Food insecurity:    Worry: Not on file    Inability: Not on file  . Transportation needs:    Medical: Not on file  Non-medical: Not on file  Tobacco Use  . Smoking status: Current Every Day Smoker    Packs/day: 0.50    Years: 20.00    Pack years: 10.00    Types: Cigarettes  . Smokeless tobacco: Never Used  Substance and Sexual Activity  . Alcohol use: Yes    Alcohol/week: 3.0 oz    Types: 5 Glasses of wine per week  . Drug use: No  . Sexual activity: Yes    Birth control/protection: Surgical  Lifestyle  . Physical activity:    Days per week: Not on file    Minutes per session: Not on file  . Stress: Not on file  Relationships  . Social connections:    Talks on phone: Not on file    Gets  together: Not on file    Attends religious service: Not on file    Active member of club or organization: Not on file    Attends meetings of clubs or organizations: Not on file    Relationship status: Not on file  . Intimate partner violence:    Fear of current or ex partner: Not on file    Emotionally abused: Not on file    Physically abused: Not on file    Forced sexual activity: Not on file  Other Topics Concern  . Not on file  Social History Narrative   Fun: Puzzles, cats   Denies any religious beliefs effecting health care. Lives with daughter in a one story home.  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  Education: some college.     Current Outpatient Medications:  .  albuterol (PROVENTIL HFA;VENTOLIN HFA) 108 (90 Base) MCG/ACT inhaler, Inhale 2 puffs into the lungs every 6 (six) hours as needed for wheezing or shortness of breath., Disp: 1 Inhaler, Rfl: 0 .  amLODipine (NORVASC) 10 MG tablet, Take 1 tablet (10 mg total) by mouth daily., Disp: 90 tablet, Rfl: 1 .  cetirizine (ZYRTEC) 10 MG tablet, Take 1 tablet (10 mg total) by mouth daily., Disp: 90 tablet, Rfl: 0 .  escitalopram (LEXAPRO) 20 MG tablet, Take 0.5 tablets (10 mg total) by mouth daily., Disp: 30 tablet, Rfl: 1 .  fluticasone (FLONASE) 50 MCG/ACT nasal spray, Place 2 sprays into both nostrils daily., Disp: 16 g, Rfl: 6 .  gabapentin (NEURONTIN) 300 MG capsule, Take 1 capsule (300 mg total) by mouth 3 (three) times daily., Disp: 90 capsule, Rfl: 0 .  Ibuprofen-Famotidine (DUEXIS) 800-26.6 MG TABS, Take 1 tablet by mouth 3 (three) times daily as needed., Disp: 90 tablet, Rfl: 0 .  Nebivolol HCl (BYSTOLIC) 20 MG TABS, Take 1 tablet (20 mg total) by mouth daily., Disp: 90 tablet, Rfl: 0 .  omeprazole (PRILOSEC) 20 MG capsule, TAKE 1 CAPSULE BY MOUTH EVERY DAY, Disp: 90 capsule, Rfl: 0 .  valACYclovir (VALTREX) 500 MG tablet, Take 1 tablet (500 mg total) by mouth 2 (two) times daily. (Patient taking differently: Take 500 mg by  mouth 2 (two) times daily. As needed outbreak), Disp: 6 tablet, Rfl: 3 .  varenicline (CHANTIX CONTINUING MONTH PAK) 1 MG tablet, Take 1 tablet (1 mg total) by mouth 2 (two) times daily., Disp: 60 tablet, Rfl: 2 .  varenicline (CHANTIX) 0.5 MG tablet, Take 1 tablet (0.5 mg total) by mouth 2 (two) times daily., Disp: 11 tablet, Rfl: 0 .  Vitamin D, Ergocalciferol, (DRISDOL) 50000 units CAPS capsule, Take 1 capsule (50,000 Units total) by mouth every 7 (seven) days., Disp: 12 capsule, Rfl: 0  No  Known Allergies   ROS See HPI  Objective  Vitals:   12/16/17 0928  BP: 140/78  Pulse: 92  SpO2: 96%  Weight: 238 lb (108 kg)  Height: 5\' 9"  (1.753 m)    Body mass index is 35.15 kg/m.  Physical Exam Vital signs reviewed. Constitutional: Patient appears well-developed and well-nourished. No distress.  HENT: Head: Normocephalic and atraumatic. Nose: Nose normal. Mouth/Throat: Oropharynx is clear and moist. No oropharyngeal exudate.  Eyes: Conjunctivae and EOM are normal. Pupils are equal, round, and reactive to light. No scleral icterus.  Neck: Normal range of motion. Neck supple.  Cardiovascular: Normal rate, regular rhythm and normal heart sounds.  No murmur heard. No BLE edema. Distal pulses intact. Pulmonary/Chest: Effort normal and breath sounds normal. No respiratory distress. Neurological: she is alert and oriented to person, place, and time. No cranial nerve deficit. Coordination, balance, strength, speech and gait are normal.  Skin: Skin is warm and dry. No rash noted. No erythema.  Psychiatric: Patient has a normal mood and affect. behavior is normal. Judgment and thought content normal.   PHQ2/9: Depression screen [facility] 2/9 09/09/2017 06/03/2017  Decreased Interest 1 2  Down, Depressed, Hopeless 1 3  PHQ - 2 Score 2 5  Altered sleeping 1 3  Tired, decreased energy 3 3  Change in appetite 3 3  Feeling bad or failure about yourself  0 3  Trouble concentrating 0 1  Moving  slowly or fidgety/restless 0 0  Suicidal thoughts 0 1  PHQ-9 Score 9 19     Fall Risk: Fall Risk  02/06/2016 08/01/2015  Falls in the past year? No No    Assessment & Plan RTC in 1 month for F/U: nicotine dependence: starting wellbutring  Syncope, unspecified syncope type Denies recurrent symptoms today, I suspect what she described to me at last OV was vasovagal syncope She had a recent cardiology evaluation and will continue to follow with cardiology routinely Will update EKG today - EKG 12-Lead: I have personally reviewed the EKG tracing and agree with computerized printout as noted: sinus rhythm without acute abnormality comparison to prior EKG dated 07/25/17.

## 2017-12-16 NOTE — Patient Instructions (Addendum)
Please start wellbutrin 150mg  once daily for smoking cessation.  It will also be helpful to get a stress ball or something else that you can use to keep your hands busy when you get the urge to smoke.  I'd like for you to come in to see me in about 1 month, to make sure you are doing okay on the wellbutrin and see how you are doing with quitting smoking. 1-800-QUIT-NOW can also offer daily talk therapy or other help if you need.   Coping with Quitting Smoking Quitting smoking is a physical and mental challenge. You will face cravings, withdrawal symptoms, and temptation. Before quitting, work with your health care provider to make a plan that can help you cope. Preparation can help you quit and keep you from giving in. How can I cope with cravings? Cravings usually last for 5-10 minutes. If you get through it, the craving will pass. Consider taking the following actions to help you cope with cravings:  Keep your mouth busy: ? Chew sugar-free gum. ? Suck on hard candies or a straw. ? Brush your teeth.  Keep your hands and body busy: ? Immediately change to a different activity when you feel a craving. ? Squeeze or play with a ball. ? Do an activity or a hobby, like making bead jewelry, practicing needlepoint, or working with wood. ? Mix up your normal routine. ? Take a short exercise break. Go for a quick walk or run up and down stairs. ? Spend time in public places where smoking is not allowed.  Focus on doing something kind or helpful for someone else.  Call a friend or family member to talk during a craving.  Join a support group.  Call a quit line, such as 1-800-QUIT-NOW.  Talk with your health care provider about medicines that might help you cope with cravings and make quitting easier for you.  How can I deal with withdrawal symptoms? Your body may experience negative effects as it tries to get used to not having nicotine in the system. These effects are called withdrawal  symptoms. They may include:  Feeling hungrier than normal.  Trouble concentrating.  Irritability.  Trouble sleeping.  Feeling depressed.  Restlessness and agitation.  Craving a cigarette.  To manage withdrawal symptoms:  Avoid places, people, and activities that trigger your cravings.  Remember why you want to quit.  Get plenty of sleep.  Avoid coffee and other caffeinated drinks. These may worsen some of your symptoms.  How can I handle social situations? Social situations can be difficult when you are quitting smoking, especially in the first few weeks. To manage this, you can:  Avoid parties, bars, and other social situations where people might be smoking.  Avoid alcohol.  Leave right away if you have the urge to smoke.  Explain to your family and friends that you are quitting smoking. Ask for understanding and support.  Plan activities with friends or family where smoking is not an option.  What are some ways I can cope with stress? Wanting to smoke may cause stress, and stress can make you want to smoke. Find ways to manage your stress. Relaxation techniques can help. For example:  Breathe slowly and deeply, in through your nose and out through your mouth.  Listen to soothing, relaxing music.  Talk with a family member or friend about your stress.  Light a candle.  Soak in a bath or take a shower.  Think about a peaceful place.  What are  some ways I can prevent weight gain? Be aware that many people gain weight after they quit smoking. However, not everyone does. To keep from gaining weight, have a plan in place before you quit and stick to the plan after you quit. Your plan should include:  Having healthy snacks. When you have a craving, it may help to: ? Eat plain popcorn, crunchy carrots, celery, or other cut vegetables. ? Chew sugar-free gum.  Changing how you eat: ? Eat small portion sizes at meals. ? Eat 4-6 small meals throughout the day  instead of 1-2 large meals a day. ? Be mindful when you eat. Do not watch television or do other things that might distract you as you eat.  Exercising regularly: ? Make time to exercise each day. If you do not have time for a long workout, do short bouts of exercise for 5-10 minutes several times a day. ? Do some form of strengthening exercise, like weight lifting, and some form of aerobic exercise, like running or swimming.  Drinking plenty of water or other low-calorie or no-calorie drinks. Drink 6-8 glasses of water daily, or as much as instructed by your health care provider.  Summary  Quitting smoking is a physical and mental challenge. You will face cravings, withdrawal symptoms, and temptation to smoke again. Preparation can help you as you go through these challenges.  You can cope with cravings by keeping your mouth busy (such as by chewing gum), keeping your body and hands busy, and making calls to family, friends, or a helpline for people who want to quit smoking.  You can cope with withdrawal symptoms by avoiding places where people smoke, avoiding drinks with caffeine, and getting plenty of rest.  Ask your health care provider about the different ways to prevent weight gain, avoid stress, and handle social situations. This information is not intended to replace advice given to you by your health care provider. Make sure you discuss any questions you have with your health care provider. Document Released: 06/04/2016 Document Revised: 06/04/2016 Document Reviewed: 06/04/2016 Elsevier Interactive Patient Education  [employer].

## 2017-12-16 NOTE — Assessment & Plan Note (Signed)
Stable Continue current medications RTC for new, worsening symptoms - escitalopram (LEXAPRO) 20 MG tablet; Take 1 tablet (20 mg total) by mouth daily.  Dispense: 90 tablet; Refill: 1

## 2017-12-16 NOTE — Assessment & Plan Note (Signed)
Will start trial of wellbutrin for smoking cessation-dosing and side effects discussed Additional coping methods and resources to quit smoking discussed and printed on AVS RTC in 1 month for F/U - buPROPion (WELLBUTRIN XL) 150 MG 24 hr tablet; Take 1 tablet (150 mg total) by mouth daily.  Dispense: 30 tablet; Refill: 2

## 2017-12-19 NOTE — Telephone Encounter (Signed)
Refill done.  

## 2017-12-21 ENCOUNTER — Other Ambulatory Visit: Payer: Self-pay

## 2017-12-21 DIAGNOSIS — Z720 Tobacco use: Secondary | ICD-10-CM

## 2017-12-21 MED ORDER — BUPROPION HCL ER (XL) 150 MG PO TB24: 150.0000 mg | ORAL_TABLET | Freq: Every day | ORAL | 0 refills | Status: DC

## 2017-12-30 ENCOUNTER — Ambulatory Visit: Payer: BLUE CROSS/BLUE SHIELD | Admitting: Cardiology

## 2018-01-02 ENCOUNTER — Telehealth: Payer: Self-pay | Admitting: Nurse Practitioner

## 2018-01-02 DIAGNOSIS — M159 Polyosteoarthritis, unspecified: Secondary | ICD-10-CM

## 2018-01-02 MED ORDER — IBUPROFEN-FAMOTIDINE 800-26.6 MG PO TABS: 1.0000 | ORAL_TABLET | Freq: Three times a day (TID) | ORAL | 0 refills | Status: DC | PRN

## 2018-01-02 NOTE — Telephone Encounter (Signed)
Please remind her to schedule her mammogram, this was ordered at her CPE in May. I have sent a 1 month supply of duexis at her request, but can not fill the nabumetone, as it is also an NSAID and should not be taking 2 nsaids at once (duexis and nabumetone) Please have her follow up with neurology for further evaluation of headaches as we have discussed.

## 2018-01-02 NOTE — Telephone Encounter (Signed)
Called [facility] 7/13 at 12:58pm.  States that she is out of duexis and had been since the previous day.  States she was having pain under her right breast like a cramp.  Stating it comes and goes.  States pain is a 10.  Patient was instructed to call the office to make an appointment once the office opened.

## 2018-01-02 NOTE — Telephone Encounter (Signed)
Called pt to check on her with breast pain. She states that the breast pain is much better today. She is asking for a refill of Duexis which [facility] to [facility]. She is also asking for a refill of a medication called nabumetone for her headaches. She says she has been having more headaches lately and this used to be given to her to help them. Please advise.

## 2018-01-03 NOTE — Telephone Encounter (Signed)
Pt aware of response below.  

## 2018-01-11 NOTE — Progress Notes (Signed)
[facility] [address] [place], [place] [address] Phone: [phone] Subjective:     CC: Neck pain follow-up  [id number]  Jennifer Waters is a 57 y.o. female coming in with complaint of neck pain. She is still having pain. Has not been doing exercises. Feels like stress is adding to her neck pain.  Patient states that she does the exercises she feels better but has not been doing them regularly.  States that continues to have discomfort and pain over elbow.  Patient states no radiation down to the arms.       Past Medical History:  Diagnosis Date  . Arthritis   . Hypertension   . Migraines    Past Surgical History:  Procedure Laterality Date  . ABDOMINAL HYSTERECTOMY     Social History   Socioeconomic History  . Marital status: Divorced    Spouse name: Not on file  . Number of children: 1  . Years of education: 72  . Highest education level: Not on file  Occupational History  . Occupation: Therapist, music  . Financial resource strain: Not on file  . Food insecurity:    Worry: Not on file    Inability: Not on file  . Transportation needs:    Medical: Not on file    Non-medical: Not on file  Tobacco Use  . Smoking status: Current Every Day Smoker    Packs/day: 0.50    Years: 20.00    Pack years: 10.00    Types: Cigarettes  . Smokeless tobacco: Never Used  Substance and Sexual Activity  . Alcohol use: Yes    Alcohol/week: 3.0 oz    Types: 5 Glasses of wine per week  . Drug use: No  . Sexual activity: Yes    Birth control/protection: Surgical  Lifestyle  . Physical activity:    Days per week: Not on file    Minutes per session: Not on file  . Stress: Not on file  Relationships  . Social connections:    Talks on phone: Not on file    Gets together: Not on file    Attends religious service: Not on file    Active member of club or organization: Not on file    Attends meetings of clubs or organizations: Not  on file    Relationship status: Not on file  Other Topics Concern  . Not on file  Social History Narrative   Fun: Puzzles, cats   Denies any religious beliefs effecting health care. Lives with daughter in a one story home.  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  Education: some college.   Allergies  Allergen Reactions  . Chantix [Varenicline] Nausea Only   Family History  Problem Relation Age of Onset  . Arthritis Mother   . Ovarian cancer Mother   . Migraines Mother   . Lung cancer Father   . Arrhythmia Sister   . Brain cancer Brother   . Edema Sister   . Arthritis Sister   . Diabetes Brother      Past medical history, social, surgical and family history all reviewed in electronic medical record.  No pertanent information unless stated regarding to the chief complaint.   Review of Systems:Review of systems updated and as accurate as of 01/13/18  No  visual changes, nausea, vomiting, diarrhea, constipation, dizziness, abdominal pain, skin rash, fevers, chills, night sweats, weight loss, swollen lymph nodes, body aches, joint swelling,, chest pain, shortness of breath,  mood changes.  Positive headache and muscle aches  Objective  Blood pressure 138/86, pulse 81, height 5\' 9"  (1.753 m), SpO2 96 %. Systems examined below as of 01/13/18   General: No apparent distress alert and oriented x3 mood and affect normal, dressed appropriately.  HEENT: Pupils equal, extraocular movements intact  Respiratory: Patient's speak in full sentences and does not appear short of breath  Cardiovascular: No lower extremity edema, non tender, no erythema  Skin: Warm dry intact with no signs of infection or rash on extremities or on axial skeleton.  Abdomen: Soft nontender  Neuro: Cranial nerves II through XII are intact, neurovascularly intact in all extremities with 2+ DTRs and 2+ pulses.  Lymph: No lymphadenopathy of posterior or anterior cervical chain or axillae bilaterally.  Gait normal with good  balance and coordination.  MSK: Mild tender with full range of motion and good stability and symmetric strength and tone of shoulders, elbows, wrist, hip, knee and ankles bilaterally.  Mild arthritic changes Neck: Inspection loss of lordosis. No palpable stepoffs. Negative Spurling's maneuver.  Patient does have some mild limited range of motion in sidebending and rotation bilaterally right greater than left Grip strength and sensation normal in bilateral hands Strength good C4 to T1 distribution No sensory change to C4 to T1 Negative Hoffman sign bilaterally Reflexes normal   Osteopathic findings C2 flexed rotated and side bent right C6 flexed rotated and side bent left T3 extended rotated and side bent right inhaled third rib T8 extended rotated and side bent left     Impression and Recommendations:     This case required medical decision making of moderate complexity.      Note: This dictation was prepared with Dragon dictation along with smaller phrase technology. Any transcriptional errors that result from this process are unintentional.

## 2018-01-13 ENCOUNTER — Ambulatory Visit (INDEPENDENT_AMBULATORY_CARE_PROVIDER_SITE_OTHER): Payer: BLUE CROSS/BLUE SHIELD | Admitting: Family Medicine

## 2018-01-13 ENCOUNTER — Ambulatory Visit (INDEPENDENT_AMBULATORY_CARE_PROVIDER_SITE_OTHER): Payer: BLUE CROSS/BLUE SHIELD | Admitting: Nurse Practitioner

## 2018-01-13 ENCOUNTER — Encounter: Payer: Self-pay | Admitting: Nurse Practitioner

## 2018-01-13 ENCOUNTER — Encounter: Payer: Self-pay | Admitting: Family Medicine

## 2018-01-13 VITALS — BP 138/86 | HR 81 | Ht 69.0 in

## 2018-01-13 VITALS — BP 138/84 | HR 66 | Temp 98.5°F | Resp 16 | Ht 69.0 in | Wt 232.0 lb

## 2018-01-13 DIAGNOSIS — R51 Headache: Secondary | ICD-10-CM

## 2018-01-13 DIAGNOSIS — F32A Depression, unspecified: Secondary | ICD-10-CM

## 2018-01-13 DIAGNOSIS — F329 Major depressive disorder, single episode, unspecified: Secondary | ICD-10-CM

## 2018-01-13 DIAGNOSIS — R519 Headache, unspecified: Secondary | ICD-10-CM

## 2018-01-13 DIAGNOSIS — M999 Biomechanical lesion, unspecified: Secondary | ICD-10-CM | POA: Diagnosis not present

## 2018-01-13 DIAGNOSIS — F419 Anxiety disorder, unspecified: Secondary | ICD-10-CM

## 2018-01-13 DIAGNOSIS — Z720 Tobacco use: Secondary | ICD-10-CM

## 2018-01-13 DIAGNOSIS — M5010 Cervical disc disorder with radiculopathy, unspecified cervical region: Secondary | ICD-10-CM | POA: Diagnosis not present

## 2018-01-13 MED ORDER — VENLAFAXINE HCL ER 75 MG PO CP24: 75.0000 mg | ORAL_CAPSULE | Freq: Every day | ORAL | 1 refills | Status: DC

## 2018-01-13 MED ORDER — HYDROXYZINE HCL 10 MG PO TABS: 10.0000 mg | ORAL_TABLET | Freq: Three times a day (TID) | ORAL | 0 refills | Status: DC | PRN

## 2018-01-13 NOTE — Assessment & Plan Note (Signed)
Known arthritic changes in the neck.  No significant radiation at the moment but does have known mild spinal stenosis.  We discussed with patient about taking the gabapentin on a regular basis.  We discussed different medications that could be beneficial such as the Effexor.  Patient will consider this.  We discussed icing regimen and home exercises.  Patient will follow-up with me again in 4 weeks

## 2018-01-13 NOTE — Progress Notes (Signed)
Name: Jennifer Waters   MRN: [id number]    DOB: January 17, 1961   Date:01/13/2018       Progress Note  Subjective   HPI Ms Fake is here today for a follow up of smoking cessation. She would also like to discuss her anxiety and depression medication. She started wellbutrin XL 150 daily at her last OV on 12/16/17 for smoking cessation. She says she has successfully cut back to 1/2 ppd and continuing to cut back. She continues to feel some desire to smoke but has been using stress balls and straws to try to avoid smoking and keep her mind off cigarettes. She feels she is tolerating the wellbutrin well, has not noted any adverse effects. She is currently maintained on lexapro for anxiety and depression which she feels is helpful, but was told by her sports medicine provider to consider switching to effexor due to headaches. She was on effexor in the past and did not feel that it worked as well for her anxiety and depression but is requesting to try switching back to effexor to see if her headaches improve. She says she continues to experience headaChes almost daily, I have ordered a CT head and referred her to neurology for her in the past but she did not follow up with the CT scan or referral. She says today that her sports med provider thinks her headaches may be related to her stress and neck pain. She overall feels well today. No fevers, weakness, syncope, confusion, thoughts of hurting self or others  Patient Active Problem List   Diagnosis Date Noted  . Nonallopathic lesion of cervical region 12/02/2017  . Nonallopathic lesion of thoracic region 12/02/2017  . Nonallopathic lesion of rib cage 12/02/2017  . Encounter for general adult medical examination with abnormal findings 11/04/2017  . Tobacco abuse 07/08/2017  . Anxiety and depression 11/22/2016  . Cervical disc disorder with radiculopathy of cervical region 05/04/2016  . Cough 03/15/2016  . Snoring 12/11/2015  . GERD (gastroesophageal  reflux disease) 12/11/2015  . Allergic rhinitis 05/01/2015  . Osteoarthritis 10/17/2014  . Essential hypertension 10/17/2014    Past Surgical History:  Procedure Laterality Date  . ABDOMINAL HYSTERECTOMY      Family History  Problem Relation Age of Onset  . Arthritis Mother   . Ovarian cancer Mother   . Migraines Mother   . Lung cancer Father   . Arrhythmia Sister   . Brain cancer Brother   . Edema Sister   . Arthritis Sister   . Diabetes Brother     Social History   Socioeconomic History  . Marital status: Divorced    Spouse name: Not on file  . Number of children: 1  . Years of education: 69  . Highest education level: Not on file  Occupational History  . Occupation: Therapist, music  . Financial resource strain: Not on file  . Food insecurity:    Worry: Not on file    Inability: Not on file  . Transportation needs:    Medical: Not on file    Non-medical: Not on file  Tobacco Use  . Smoking status: Current Every Day Smoker    Packs/day: 0.50    Years: 20.00    Pack years: 10.00    Types: Cigarettes  . Smokeless tobacco: Never Used  Substance and Sexual Activity  . Alcohol use: Yes    Alcohol/week: 3.0 oz    Types: 5 Glasses of wine per week  . Drug use:  No  . Sexual activity: Yes    Birth control/protection: Surgical  Lifestyle  . Physical activity:    Days per week: Not on file    Minutes per session: Not on file  . Stress: Not on file  Relationships  . Social connections:    Talks on phone: Not on file    Gets together: Not on file    Attends religious service: Not on file    Active member of club or organization: Not on file    Attends meetings of clubs or organizations: Not on file    Relationship status: Not on file  . Intimate partner violence:    Fear of current or ex partner: Not on file    Emotionally abused: Not on file    Physically abused: Not on file    Forced sexual activity: Not on file  Other Topics Concern  . Not on  file  Social History Narrative   Fun: Puzzles, cats   Denies any religious beliefs effecting health care. Lives with daughter in a one story home.  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  Education: some college.     Current Outpatient Medications:  .  albuterol (PROVENTIL HFA;VENTOLIN HFA) 108 (90 Base) MCG/ACT inhaler, Inhale 2 puffs into the lungs every 6 (six) hours as needed for wheezing or shortness of breath., Disp: 1 Inhaler, Rfl: 0 .  amLODipine (NORVASC) 10 MG tablet, Take 1 tablet (10 mg total) by mouth daily., Disp: 90 tablet, Rfl: 1 .  buPROPion (WELLBUTRIN XL) 150 MG 24 hr tablet, Take 1 tablet (150 mg total) by mouth daily., Disp: 90 tablet, Rfl: 0 .  cetirizine (ZYRTEC) 10 MG tablet, Take 1 tablet (10 mg total) by mouth daily., Disp: 90 tablet, Rfl: 0 .  fluticasone (FLONASE) 50 MCG/ACT nasal spray, Place 2 sprays into both nostrils daily., Disp: 16 g, Rfl: 6 .  gabapentin (NEURONTIN) 300 MG capsule, Take 1 capsule (300 mg total) by mouth 3 (three) times daily., Disp: 90 capsule, Rfl: 0 .  hydrOXYzine (ATARAX/VISTARIL) 10 MG tablet, Take 1 tablet (10 mg total) by mouth 3 (three) times daily as needed., Disp: 90 tablet, Rfl: 0 .  Ibuprofen-Famotidine (DUEXIS) 800-26.6 MG TABS, Take 1 tablet by mouth 3 (three) times daily as needed., Disp: 90 tablet, Rfl: 0 .  Nebivolol HCl (BYSTOLIC) 20 MG TABS, Take 1 tablet (20 mg total) by mouth daily., Disp: 90 tablet, Rfl: 0 .  omeprazole (PRILOSEC) 20 MG capsule, TAKE 1 CAPSULE BY MOUTH EVERY DAY, Disp: 90 capsule, Rfl: 0 .  valACYclovir (VALTREX) 500 MG tablet, Take 1 tablet (500 mg total) by mouth 2 (two) times daily. (Patient taking differently: Take 500 mg by mouth 2 (two) times daily. As needed outbreak), Disp: 6 tablet, Rfl: 3 .  Vitamin D, Ergocalciferol, (DRISDOL) 50000 units CAPS capsule, TAKE 1 CAPSULE (50,000 UNITS TOTAL) BY MOUTH EVERY 7 (SEVEN) DAYS., Disp: 12 capsule, Rfl: 0 .  venlafaxine XR (EFFEXOR-XR) 75 MG 24 hr capsule, Take 1  capsule (75 mg total) by mouth daily with breakfast. Must keep appt w/new provider for future refills, Disp: 30 capsule, Rfl: 1  Allergies  Allergen Reactions  . Chantix [Varenicline] Nausea Only     ROS See HPI  Objective  Vitals:   01/13/18 0942  BP: 138/84  Pulse: 66  Resp: 16  Temp: 98.5 F (36.9 C)  TempSrc: Oral  SpO2: 96%  Weight: 232 lb (105.2 kg)  Height: 5\' 9"  (1.753 m)   Body  mass index is 34.26 kg/m.  Physical Exam Vital signs reviewed. Constitutional: Patient appears well-developed and well-nourished. No distress.  HENT: Head: Normocephalic and atraumatic. Nose: Nose normal. Mouth/Throat: Oropharynx is clear and moist. No oropharyngeal exudate.  Eyes: Conjunctivae and EOM are normal. Pupils are equal, round, and reactive to light. No scleral icterus.  Neck: Normal range of motion. Neck supple.  Cardiovascular: Normal rate, regular rhythm and normal heart sounds. No murmur heard.No BLE edema. Distal pulses intact. Pulmonary/Chest: Effort normal and breath sounds normal. No respiratory distress. Neurological:she is alert and oriented to person, place, and time. No cranial nerve deficit. Coordination, balance, strength, speech and gait are normal.  Skin: Skin is warm and dry. No rash noted. No erythema.  Psychiatric: Patient has a normal mood and affect. behavior is normal. Judgment and thought content normal.  Assessment & Plan RTC in 1 month for F/U: Smoking cessation- continue wellbutrin; Anxiety and depression-stop lexapro, start effexor to see if headaches improve  Nonintractable headache, unspecified chronicity pattern, unspecified headache type Will switch lexapro to effexor today to see if this helps her headaches Continue follow up with sports med for neck pain  RTC in 73month for F/U

## 2018-01-13 NOTE — Assessment & Plan Note (Signed)
Decision today to treat with OMT was based on Physical Exam  After verbal consent patient was treated with HVLA, ME, FPR techniques in cervical, thoracic, rib areas  Patient tolerated the procedure well with improvement in symptoms  Patient given exercises, stretches and lifestyle modifications  See medications in patient instructions if given  Patient will follow up in 4-5 weeks 

## 2018-01-13 NOTE — Assessment & Plan Note (Signed)
Will stop lexapro and start effexor per patient request to see if headaches improve-dosing and side effects discussed She was mantained on effexor XR 75 BID in past but will start with once daily for now RTC in 1 month for F/U, can consider dosage increase if needed - venlafaxine XR (EFFEXOR-XR) 75 MG 24 hr capsule; Take 1 capsule (75 mg total) by mouth daily with breakfast. Must keep appt w/new provider for future refills  Dispense: 30 capsule; Refill: 1

## 2018-01-13 NOTE — Patient Instructions (Signed)
Please stop lexapro. Please start effexor XR 75 once daily. Some side effects such as nausea, drowsiness and weight gain can occur.  Also rarely people have experienced suicidal thoughts when taking this medication.  Please discontinue the medication and go directly to ED if this occurs.  Id like to see you back in about 1 month to evaluate progress.     We will continue wellbutrin for now, keep up the good work on quitting smoking!  I would like to see you back in 1 month to see how you are doing.

## 2018-01-13 NOTE — Patient Instructions (Addendum)
Good to see you  Ice 20 minutes 2 times daily. Usually after activity and before bed. Exercises 3 times a week.  Hydroxyzine 10 mg 3 times a day  Ask about the effexor again  See me again I n6 weeks Get a massage and try massage envy or consider [facility]

## 2018-01-13 NOTE — Assessment & Plan Note (Signed)
Asked about tobacco use. She was advised to quit. Assess willingness to make an attempt: She is actively working on quitting Assist in quit attempt: We discussed continued wellbutrin and non-pharm techniques to quit 1-800-QUIT-NOW (daily talk therapy); squeeze ball, juggling change, paper log of # spent  3 minutes were spent counseling on smoking risk factors and going over medications for assistance. Arrange follow up: She will RTC in about 1 month for follow up, we could consider increasing wellbutrin dosage if needed, but did not want to make >1 medication change today

## 2018-02-17 ENCOUNTER — Ambulatory Visit (INDEPENDENT_AMBULATORY_CARE_PROVIDER_SITE_OTHER): Payer: BLUE CROSS/BLUE SHIELD | Admitting: Nurse Practitioner

## 2018-02-17 ENCOUNTER — Encounter: Payer: Self-pay | Admitting: Nurse Practitioner

## 2018-02-17 VITALS — BP 132/90 | HR 74 | Ht 69.0 in | Wt 243.0 lb

## 2018-02-17 DIAGNOSIS — I10 Essential (primary) hypertension: Secondary | ICD-10-CM | POA: Diagnosis not present

## 2018-02-17 DIAGNOSIS — Z23 Encounter for immunization: Secondary | ICD-10-CM

## 2018-02-17 DIAGNOSIS — Z720 Tobacco use: Secondary | ICD-10-CM | POA: Diagnosis not present

## 2018-02-17 DIAGNOSIS — F329 Major depressive disorder, single episode, unspecified: Secondary | ICD-10-CM

## 2018-02-17 DIAGNOSIS — F419 Anxiety disorder, unspecified: Secondary | ICD-10-CM

## 2018-02-17 DIAGNOSIS — F32A Depression, unspecified: Secondary | ICD-10-CM

## 2018-02-17 MED ORDER — AMLODIPINE BESYLATE 10 MG PO TABS: 10.0000 mg | ORAL_TABLET | Freq: Every day | ORAL | 1 refills | Status: DC

## 2018-02-17 MED ORDER — NEBIVOLOL HCL 20 MG PO TABS: 20.0000 mg | ORAL_TABLET | Freq: Every day | ORAL | 1 refills | Status: DC

## 2018-02-17 NOTE — Assessment & Plan Note (Signed)
Encouraged to quit smoking Continue wellbutrin for now

## 2018-02-17 NOTE — Assessment & Plan Note (Addendum)
We did discuss that we could increase effexor dosage but she is dealing with acute stress at this time so we will continue current dosage and try counseling for now Home management and return precautions discussed, printed in AVS RTC in 3 months for F/U, or sooner for new, worsening symptoms - Ambulatory referral to Psychology

## 2018-02-17 NOTE — Assessment & Plan Note (Signed)
Stable Requesting refills today, continue current medications - amLODipine (NORVASC) 10 MG tablet; Take 1 tablet (10 mg total) by mouth daily.  Dispense: 90 tablet; Refill: 1 - Nebivolol HCl (BYSTOLIC) 20 MG TABS; Take 1 tablet (20 mg total) by mouth daily.  Dispense: 90 tablet; Refill: 1

## 2018-02-17 NOTE — Patient Instructions (Signed)
I have placed a referral to counseling today. Our office will begin processing this referral. Please follow up if you have not heard anything about this referral within 10 days.  Please return in about 3 months for follow up, or sooner if needed.   Stress and Stress Management Stress is a normal reaction to life events. It is what you feel when life demands more than you are used to or more than you can handle. Some stress can be useful. For example, the stress reaction can help you catch the last bus of the day, study for a test, or meet a deadline at work. But stress that occurs too often or for too long can cause problems. It can affect your emotional health and interfere with relationships and normal daily activities. Too much stress can weaken your immune system and increase your risk for physical illness. If you already have a medical problem, stress can make it worse. What are the causes? All sorts of life events may cause stress. An event that causes stress for one person may not be stressful for another person. Major life events commonly cause stress. These may be positive or negative. Examples include losing your job, moving into a new home, getting married, having a baby, or losing a loved one. Less obvious life events may also cause stress, especially if they occur day after day or in combination. Examples include working long hours, driving in traffic, caring for children, being in debt, or being in a difficult relationship. What are the signs or symptoms? Stress may cause emotional symptoms including, the following:  Anxiety. This is feeling worried, afraid, on edge, overwhelmed, or out of control.  Anger. This is feeling irritated or impatient.  Depression. This is feeling sad, down, helpless, or guilty.  Difficulty focusing, remembering, or making decisions.  Stress may cause physical symptoms, including the following:  Aches and pains. These may affect your head, neck, back,  stomach, or other areas of your body.  Tight muscles or clenched jaw.  Low energy or trouble sleeping.  Stress may cause unhealthy behaviors, including the following:  Eating to feel better (overeating) or skipping meals.  Sleeping too little, too much, or both.  Working too much or putting off tasks (procrastination).  Smoking, drinking alcohol, or using drugs to feel better.  How is this diagnosed? Stress is diagnosed through an assessment by your health care provider. Your health care provider will ask questions about your symptoms and any stressful life events.Your health care provider will also ask about your medical history and may order blood tests or other tests. Certain medical conditions and medicine can cause physical symptoms similar to stress. Mental illness can cause emotional symptoms and unhealthy behaviors similar to stress. Your health care provider may refer you to a mental health professional for further evaluation. How is this treated? Stress management is the recommended treatment for stress.The goals of stress management are reducing stressful life events and coping with stress in healthy ways. Techniques for reducing stressful life events include the following:  Stress identification. Self-monitor for stress and identify what causes stress for you. These skills may help you to avoid some stressful events.  Time management. Set your priorities, keep a calendar of events, and learn to say "no." These tools can help you avoid making too many commitments.  Techniques for coping with stress include the following:  Rethinking the problem. Try to think realistically about stressful events rather than ignoring them or overreacting. Try to find  the positives in a stressful situation rather than focusing on the negatives.  Exercise. Physical exercise can release both physical and emotional tension. The key is to find a form of exercise you enjoy and do it  regularly.  Relaxation techniques. These relax the body and mind. Examples include yoga, meditation, tai chi, biofeedback, deep breathing, progressive muscle relaxation, listening to music, being out in nature, journaling, and other hobbies. Again, the key is to find one or more that you enjoy and can do regularly.  Healthy lifestyle. Eat a balanced diet, get plenty of sleep, and do not smoke. Avoid using alcohol or drugs to relax.  Strong support network. Spend time with family, friends, or other people you enjoy being around.Express your feelings and talk things over with someone you trust.  Counseling or talktherapy with a mental health professional may be helpful if you are having difficulty managing stress on your own. Medicine is typically not recommended for the treatment of stress.Talk to your health care provider if you think you need medicine for symptoms of stress. Follow these instructions at home:  Keep all follow-up visits as directed by your health care provider.  Take all medicines as directed by your health care provider. Contact a health care provider if:  Your symptoms get worse or you start having new symptoms.  You feel overwhelmed by your problems and can no longer manage them on your own. Get help right away if:  You feel like hurting yourself or someone else. This information is not intended to replace advice given to you by your health care provider. Make sure you discuss any questions you have with your health care provider. Document Released: 12/01/2000 Document Revised: 11/13/2015 Document Reviewed: 01/30/2013 Elsevier Interactive Patient Education  2017 [employer].

## 2018-02-17 NOTE — Progress Notes (Signed)
Name: Jennifer Waters   MRN: [id number]    DOB: 24-Dec-1960   Date:02/17/2018       Progress Note  Subjective  Chief Complaint Follow up  HPI [name] is here today for follow up of Anxiety and depression and smoking cessation. She was here on 7/26 and we changed her lexapro to effexor for anxiety and depression per recommendations from her sports med provider, who felt some of her headaches may be related to her lexapro. She tells me that she has made the medication change and tolerating medication well, feeling okay overall, feels her mood is no Better or worse, andheadaches have greatly improved. She does tell me today that her last month has been very stressful, got laid off from her job of 38 years, family member passed away, brother having surgery. She continues wellbutrin for smoking cessation, admits she has increased her cigarettes some over past month due to increased stress. She has not been to counseling in the past due to work schedule, but now that she is not working she is interested in trying counseling.  Patient Active Problem List   Diagnosis Date Noted  . Nonallopathic lesion of cervical region 12/02/2017  . Nonallopathic lesion of thoracic region 12/02/2017  . Nonallopathic lesion of rib cage 12/02/2017  . Encounter for general adult medical examination with abnormal findings 11/04/2017  . Tobacco abuse 07/08/2017  . Anxiety and depression 11/22/2016  . Cervical disc disorder with radiculopathy of cervical region 05/04/2016  . Cough 03/15/2016  . Snoring 12/11/2015  . GERD (gastroesophageal reflux disease) 12/11/2015  . Allergic rhinitis 05/01/2015  . Osteoarthritis 10/17/2014  . Essential hypertension 10/17/2014    Past Surgical History:  Procedure Laterality Date  . ABDOMINAL HYSTERECTOMY      Family History  Problem Relation Age of Onset  . Arthritis Mother   . Ovarian cancer Mother   . Migraines Mother   . Lung cancer Father   . Arrhythmia Sister    . Brain cancer Brother   . Edema Sister   . Arthritis Sister   . Diabetes Brother     Social History   Socioeconomic History  . Marital status: Divorced    Spouse name: Not on file  . Number of children: 1  . Years of education: 2  . Highest education level: Not on file  Occupational History  . Occupation: Therapist, music  . Financial resource strain: Not on file  . Food insecurity:    Worry: Not on file    Inability: Not on file  . Transportation needs:    Medical: Not on file    Non-medical: Not on file  Tobacco Use  . Smoking status: Current Every Day Smoker    Packs/day: 0.50    Years: 20.00    Pack years: 10.00    Types: Cigarettes  . Smokeless tobacco: Never Used  Substance and Sexual Activity  . Alcohol use: Yes    Alcohol/week: 5.0 standard drinks    Types: 5 Glasses of wine per week  . Drug use: No  . Sexual activity: Yes    Birth control/protection: Surgical  Lifestyle  . Physical activity:    Days per week: Not on file    Minutes per session: Not on file  . Stress: Not on file  Relationships  . Social connections:    Talks on phone: Not on file    Gets together: Not on file    Attends religious service: Not on file  Active member of club or organization: Not on file    Attends meetings of clubs or organizations: Not on file    Relationship status: Not on file  . Intimate partner violence:    Fear of current or ex partner: Not on file    Emotionally abused: Not on file    Physically abused: Not on file    Forced sexual activity: Not on file  Other Topics Concern  . Not on file  Social History Narrative   Fun: Puzzles, cats   Denies any religious beliefs effecting health care. Lives with daughter in a one story home.  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  Education: some college.     Current Outpatient Medications:  .  albuterol (PROVENTIL HFA;VENTOLIN HFA) 108 (90 Base) MCG/ACT inhaler, Inhale 2 puffs into the lungs every 6 (six)  hours as needed for wheezing or shortness of breath., Disp: 1 Inhaler, Rfl: 0 .  amLODipine (NORVASC) 10 MG tablet, Take 1 tablet (10 mg total) by mouth daily., Disp: 90 tablet, Rfl: 1 .  buPROPion (WELLBUTRIN XL) 150 MG 24 hr tablet, Take 1 tablet (150 mg total) by mouth daily., Disp: 90 tablet, Rfl: 0 .  cetirizine (ZYRTEC) 10 MG tablet, Take 1 tablet (10 mg total) by mouth daily., Disp: 90 tablet, Rfl: 0 .  fluticasone (FLONASE) 50 MCG/ACT nasal spray, Place 2 sprays into both nostrils daily., Disp: 16 g, Rfl: 6 .  gabapentin (NEURONTIN) 300 MG capsule, Take 1 capsule (300 mg total) by mouth 3 (three) times daily., Disp: 90 capsule, Rfl: 0 .  hydrOXYzine (ATARAX/VISTARIL) 10 MG tablet, Take 1 tablet (10 mg total) by mouth 3 (three) times daily as needed., Disp: 90 tablet, Rfl: 0 .  Ibuprofen-Famotidine (DUEXIS) 800-26.6 MG TABS, Take 1 tablet by mouth 3 (three) times daily as needed., Disp: 90 tablet, Rfl: 0 .  Nebivolol HCl (BYSTOLIC) 20 MG TABS, Take 1 tablet (20 mg total) by mouth daily., Disp: 90 tablet, Rfl: 0 .  omeprazole (PRILOSEC) 20 MG capsule, TAKE 1 CAPSULE BY MOUTH EVERY DAY, Disp: 90 capsule, Rfl: 0 .  valACYclovir (VALTREX) 500 MG tablet, Take 1 tablet (500 mg total) by mouth 2 (two) times daily. (Patient taking differently: Take 500 mg by mouth 2 (two) times daily. As needed outbreak), Disp: 6 tablet, Rfl: 3 .  venlafaxine XR (EFFEXOR-XR) 75 MG 24 hr capsule, Take 1 capsule (75 mg total) by mouth daily with breakfast. Must keep appt w/new provider for future refills, Disp: 30 capsule, Rfl: 1 .  Vitamin D, Ergocalciferol, (DRISDOL) 50000 units CAPS capsule, TAKE 1 CAPSULE (50,000 UNITS TOTAL) BY MOUTH EVERY 7 (SEVEN) DAYS., Disp: 12 capsule, Rfl: 0  Allergies  Allergen Reactions  . Chantix [Varenicline] Nausea Only     ROS See HPI  Objective  Vitals:   02/17/18 0824  BP: 132/90  Pulse: 74  SpO2: 96%  Weight: 243 lb (110.2 kg)  Height: 5\' 9"  (1.753 m)    Body mass  index is 35.88 kg/m.  Physical Exam Vital signs reviewed. Constitutional: Patient appears well-developed and well-nourished. No distress.  HENT: Head: Normocephalic and atraumatic. Nose: Nose normal. Mouth/Throat: Oropharynx is clear and moist. No oropharyngeal exudate.  Eyes: Conjunctivae and EOM are normal. Pupils are equal, round, and reactive to light. No scleral icterus.  Neck: Normal range of motion. Neck supple.  Cardiovascular: Normal rate, regular rhythm and distal pulses intact. Pulmonary/Chest: Effort normal,  No respiratory distress. Neurological:she is alert and oriented to person, place,  and time. No cranial nerve deficit. Coordination, balance, strength, speech and gait are normal.  Skin: Skin is warm and dry. No rash noted. No erythema.  Psychiatric: Patient has a normal mood and affect. behavior is normal. Judgment and thought content normal.    Assessment & Plan RTC in 3 months for routine follow up of chronic conditions, anxiety and depression- counseling referral today, smoking cessation-continuing wellbutrin today  -Reviewed Health Maintenance:  Need for influenza vaccination- Flu Vaccine QUAD 36+ mos IM

## 2018-02-22 ENCOUNTER — Telehealth: Payer: Self-pay | Admitting: Nurse Practitioner

## 2018-02-22 NOTE — Telephone Encounter (Signed)
-----   Message from Merril Abbe, [place] sent at 02/22/2018  9:57 AM EDT ----- Juluis Rainier:  Your patient is scheduled to see Buena Irish on 03/17/18. Pt is aware.  Thank you for the referral.

## 2018-02-23 ENCOUNTER — Ambulatory Visit (INDEPENDENT_AMBULATORY_CARE_PROVIDER_SITE_OTHER): Payer: BLUE CROSS/BLUE SHIELD | Admitting: Family Medicine

## 2018-02-23 ENCOUNTER — Encounter: Payer: Self-pay | Admitting: Family Medicine

## 2018-02-23 VITALS — BP 130/90 | HR 84 | Ht 69.0 in | Wt 241.0 lb

## 2018-02-23 DIAGNOSIS — M542 Cervicalgia: Secondary | ICD-10-CM

## 2018-02-23 DIAGNOSIS — M5010 Cervical disc disorder with radiculopathy, unspecified cervical region: Secondary | ICD-10-CM

## 2018-02-23 MED ORDER — TIZANIDINE HCL 4 MG PO TABS: 4.0000 mg | ORAL_TABLET | Freq: Three times a day (TID) | ORAL | 0 refills | Status: DC | PRN

## 2018-02-23 NOTE — Progress Notes (Signed)
[facility] [name], [name] Phone: [phone] Subjective:   [name], am serving as a scribe for Dr. Hulan Saas.     CC: Neck pain follow-up  [id number]  Jennifer Waters is a 57 y.o. female coming in with complaint of neck pain. She had some pain over the left scapula after twisting her hair. Pain has decreased since then. She used an 800 mg IBU. She states that today her right scapula is bothering her. Has been stressed as she lost her job and has a brother that had 3 surgeries in August.  Patient has had an MRI.  This was independently visualized by me showing the patient does have moderate degenerative disc disease at multiple levels and bulging disc causing some mild nerve root impingement.  Described with patient about icing regimen which she has not been doing.  Having difficulty finding time for herself with many different things that are occurring.      Past Medical History:  Diagnosis Date  . Arthritis   . Hypertension   . Migraines    Past Surgical History:  Procedure Laterality Date  . ABDOMINAL HYSTERECTOMY     Social History   Socioeconomic History  . Marital status: Divorced    Spouse name: Not on file  . Number of children: 1  . Years of education: 85  . Highest education level: Not on file  Occupational History  . Occupation: Therapist, music  . Financial resource strain: Not on file  . Food insecurity:    Worry: Not on file    Inability: Not on file  . Transportation needs:    Medical: Not on file    Non-medical: Not on file  Tobacco Use  . Smoking status: Current Every Day Smoker    Packs/day: 0.50    Years: 20.00    Pack years: 10.00    Types: Cigarettes  . Smokeless tobacco: Never Used  Substance and Sexual Activity  . Alcohol use: Yes    Alcohol/week: 5.0 standard drinks    Types: 5 Glasses of wine per week  . Drug use: No  . Sexual activity: Yes    Birth  control/protection: Surgical  Lifestyle  . Physical activity:    Days per week: Not on file    Minutes per session: Not on file  . Stress: Not on file  Relationships  . Social connections:    Talks on phone: Not on file    Gets together: Not on file    Attends religious service: Not on file    Active member of club or organization: Not on file    Attends meetings of clubs or organizations: Not on file    Relationship status: Not on file  Other Topics Concern  . Not on file  Social History Narrative   Fun: Puzzles, cats   Denies any religious beliefs effecting health care. Lives with daughter in a one story home.  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  Education: some college.   Allergies  Allergen Reactions  . Chantix [Varenicline] Nausea Only   Family History  Problem Relation Age of Onset  . Arthritis Mother   . Ovarian cancer Mother   . Migraines Mother   . Lung cancer Father   . Arrhythmia Sister   . Brain cancer Brother   . Edema Sister   . Arthritis Sister   . Diabetes Brother      Current Outpatient  Medications (Cardiovascular):  .  amLODipine (NORVASC) 10 MG tablet, Take 1 tablet (10 mg total) by mouth daily. .  Nebivolol HCl (BYSTOLIC) 20 MG TABS, Take 1 tablet (20 mg total) by mouth daily.  Current Outpatient Medications (Respiratory):  .  albuterol (PROVENTIL HFA;VENTOLIN HFA) 108 (90 Base) MCG/ACT inhaler, Inhale 2 puffs into the lungs every 6 (six) hours as needed for wheezing or shortness of breath. .  cetirizine (ZYRTEC) 10 MG tablet, Take 1 tablet (10 mg total) by mouth daily. .  fluticasone (FLONASE) 50 MCG/ACT nasal spray, Place 2 sprays into both nostrils daily.  Current Outpatient Medications (Analgesics):  Marland Kitchen  Ibuprofen-Famotidine (DUEXIS) 800-26.6 MG TABS, Take 1 tablet by mouth 3 (three) times daily as needed.   Current Outpatient Medications (Other):  Marland Kitchen  buPROPion (WELLBUTRIN XL) 150 MG 24 hr tablet, Take 1 tablet (150 mg total) by mouth daily. Marland Kitchen   gabapentin (NEURONTIN) 300 MG capsule, Take 1 capsule (300 mg total) by mouth 3 (three) times daily. .  hydrOXYzine (ATARAX/VISTARIL) 10 MG tablet, Take 1 tablet (10 mg total) by mouth 3 (three) times daily as needed. Marland Kitchen  omeprazole (PRILOSEC) 20 MG capsule, TAKE 1 CAPSULE BY MOUTH EVERY DAY .  valACYclovir (VALTREX) 500 MG tablet, Take 1 tablet (500 mg total) by mouth 2 (two) times daily. (Patient taking differently: Take 500 mg by mouth 2 (two) times daily. As needed outbreak) .  venlafaxine XR (EFFEXOR-XR) 75 MG 24 hr capsule, Take 1 capsule (75 mg total) by mouth daily with breakfast. Must keep appt w/new provider for future refills .  Vitamin D, Ergocalciferol, (DRISDOL) 50000 units CAPS capsule, TAKE 1 CAPSULE (50,000 UNITS TOTAL) BY MOUTH EVERY 7 (SEVEN) DAYS. Marland Kitchen  tiZANidine (ZANAFLEX) 4 MG tablet, Take 1 tablet (4 mg total) by mouth every 8 (eight) hours as needed for muscle spasms.    Past medical history, social, surgical and family history all reviewed in electronic medical record.  No pertanent information unless stated regarding to the chief complaint.   Review of Systems:  No headache, visual changes, nausea, vomiting, diarrhea, constipation, dizziness, abdominal pain, skin rash, fevers, chills, night sweats, weight loss, swollen lymph nodes, body aches, joint swelling, muscle aches, chest pain, shortness of breath, mood changes.   Objective  Blood pressure 130/90, pulse 84, height 5\' 9"  (1.753 m), weight 241 lb (109.3 kg), SpO2 95 %.    General: No apparent distress alert and oriented x3 mood and affect normal, dressed appropriately.  HEENT: Pupils equal, extraocular movements intact  Respiratory: Patient's speak in full sentences and does not appear short of breath  Cardiovascular: No lower extremity edema, non tender, no erythema  Skin: Warm dry intact with no signs of infection or rash on extremities or on axial skeleton.  Abdomen: Soft nontender  Neuro: Cranial nerves II  through XII are intact, neurovascularly intact in all extremities with 2+ DTRs and 2+ pulses.  Lymph: No lymphadenopathy of posterior or anterior cervical chain or axillae bilaterally.  Gait normal with good balance and coordination.  MSK:  Non tender with full range of motion and good stability and symmetric strength and tone of shoulders, elbows, wrist, hip, knee and ankles bilaterally.  Neck: Inspection loss of lordosis. No palpable stepoffs. Mild positive Spurling's maneuver. Limited range of motion lacking last 10 degrees of sidebending and extension. Grip strength and sensation normal in bilateral hands Strength good C4 to T1 distribution No sensory change to C4 to T1 Negative Hoffman sign bilaterally Reflexes normal Significant tightness  of the trapezius.    Impression and Recommendations:     This case required medical decision making of moderate complexity. The above documentation has been reviewed and is accurate and complete Lyndal Pulley, DO       Note: This dictation was prepared with Dragon dictation along with smaller phrase technology. Any transcriptional errors that result from this process are unintentional.

## 2018-02-23 NOTE — Assessment & Plan Note (Addendum)
Significant arthritic changes of the neck.  Worsening symptoms at this time with a positive Spurling's.  Discussed with patient about icing regimen and home exercise.  Discussed which activities to doing which was to avoid.  Patient is to increase activity as tolerated.  Following up again in 4 to 6 weeks after the injection.  And then will consider starting manipulation again muscle relaxer also prescribed today warned of potential side effects

## 2018-02-23 NOTE — Patient Instructions (Addendum)
Good to see you  You are a great person Look at [facility].  Zanaflex is a muscle relaxer that could help  Epidural is ordered and lets see how you do  See me again 2-3 weeks AFTER the epidural and we may start the manipulation again

## 2018-02-27 ENCOUNTER — Other Ambulatory Visit: Payer: Self-pay | Admitting: *Deleted

## 2018-02-27 DIAGNOSIS — Z720 Tobacco use: Secondary | ICD-10-CM

## 2018-02-27 MED ORDER — BUPROPION HCL ER (XL) 150 MG PO TB24: 150.0000 mg | ORAL_TABLET | Freq: Every day | ORAL | 0 refills | Status: DC

## 2018-03-10 ENCOUNTER — Ambulatory Visit (INDEPENDENT_AMBULATORY_CARE_PROVIDER_SITE_OTHER): Payer: BLUE CROSS/BLUE SHIELD | Admitting: Cardiology

## 2018-03-10 ENCOUNTER — Encounter: Payer: Self-pay | Admitting: Cardiology

## 2018-03-10 VITALS — BP 138/86 | HR 75 | Ht 69.0 in | Wt 243.6 lb

## 2018-03-10 DIAGNOSIS — I10 Essential (primary) hypertension: Secondary | ICD-10-CM | POA: Diagnosis not present

## 2018-03-10 DIAGNOSIS — R0683 Snoring: Secondary | ICD-10-CM

## 2018-03-10 DIAGNOSIS — E669 Obesity, unspecified: Secondary | ICD-10-CM

## 2018-03-10 DIAGNOSIS — Z720 Tobacco use: Secondary | ICD-10-CM | POA: Diagnosis not present

## 2018-03-10 NOTE — Patient Instructions (Signed)
NO MEDICATION CHANGES AT PRESENT.    Your physician wants you to follow-up in [place] [facility].You will receive a reminder letter in the mail two months in advance. If you don't receive a letter, please call our office to schedule the follow-up appointment.     Your physician wants you to follow-up in [place].You will receive a reminder letter in the mail two months in advance. If you don't receive a letter, please call our office to schedule the follow-up appointment.   If you need a refill on your cardiac medications before your next appointment, please call your pharmacy.

## 2018-03-10 NOTE — Progress Notes (Signed)
PCP: Lance Sell, NP  Clinic Note: Chief Complaint  Patient presents with  . Follow-up    Chest pain better    HPI: Jennifer Waters is a 57 y.o. female with a PMH notable for hypertension who was seen in consultation for chest pain back in January 2019 at the request of Lance Sell, NP.  Jennifer Waters is an otherwise relatively healthy woman with obviously difficult to control hypertension and a sister who has a history of pacemaker otherwise no significant cardiac history in the family.  Jennifer Waters is a current smoker and does note having some exertional dyspnea.  Jennifer Waters is troubled by chronic migraines.  Jennifer Waters is moderately obese and relatively sedentary without any significant exercise. Jennifer Waters has chronic GERD symptoms.  Jennifer Waters was last seen on July 25, 2017.  Noted uncontrolled hypertension.  Increased amlodipine to 10 mg in addition to Bystolic 20 mg. --Blood pressure medications adjusted and Jennifer Waters was to follow-up with [facility]  Recent Hospitalizations: None  Studies Personally Reviewed - (if available, images/films reviewed: From Epic Chart or Care Everywhere)  None  Interval History: Jennifer Waters presents here today overall doing quite well.  Jennifer Waters pretty much says that her chest pain is all but resolved now.  Only some mild GERD symptoms depending on what Jennifer Waters eats. Jennifer Waters has been under quite a bit of stress because he was just laid off from her job.  Jennifer Waters thinks that a lot of her symptoms that Jennifer Waters had when I first saw her were related to stress. As far as cardiac symptoms, Jennifer Waters does note that Jennifer Waters just feels tired a bit, but also has not been sleeping well because of her stress level.  Jennifer Waters has been told that Jennifer Waters snores and there may be consideration for possible OSA evaluation.  Jennifer Waters indicates that Jennifer Waters wakes up quite a bit at night and cannot fall asleep so it makes it difficult to tell if Jennifer Waters truly has OSA or simply just insomnia. No real PND orthopnea.  No edema.  No rapid  irregular heartbeats palpitations.  No syncope/near syncope or TIA/amaurosis fugax.  No claudication  ROS: A comprehensive was performed. Review of Systems  Constitutional: Negative for malaise/fatigue (Just deconditioning.  Does not exercise routinely) and weight loss.  HENT: Negative for congestion, nosebleeds and sinus pain.   Respiratory: Positive for cough (Occasionally, nonproductive), shortness of breath and wheezing (Also occasionally).   Cardiovascular: Positive for chest pain (Per HPI).  Gastrointestinal: Positive for heartburn. Negative for abdominal pain, blood in stool, diarrhea, melena and nausea.  Genitourinary: Negative for hematuria.  Musculoskeletal: Negative for back pain and joint pain (Mild arthritis pains).       Jennifer Waters had to pick up some broken glass and that Jennifer Waters has a small shard in her left second digit with some mild induration.  No signs of infection.  Neurological: Positive for headaches (Chronic daily headaches.  Some migraine, some other). Negative for seizures (No recurrent seizures).  Psychiatric/Behavioral: The patient is nervous/anxious (Partially because of losing her job.) and has insomnia.   All other systems reviewed and are negative.   I have reviewed and (if needed) personally updated the patient's problem list, medications, allergies, past medical and surgical history, social and family history.   Past Medical History:  Diagnosis Date  . Arthritis   . Hypertension   . Migraines     Past Surgical History:  Procedure Laterality Date  . ABDOMINAL HYSTERECTOMY      Current Meds  Medication  Sig  . albuterol (PROVENTIL HFA;VENTOLIN HFA) 108 (90 Base) MCG/ACT inhaler Inhale 2 puffs into the lungs every 6 (six) hours as needed for wheezing or shortness of breath.  Marland Kitchen amLODipine (NORVASC) 10 MG tablet Take 1 tablet (10 mg total) by mouth daily.  Marland Kitchen buPROPion (WELLBUTRIN XL) 150 MG 24 hr tablet Take 1 tablet (150 mg total) by mouth daily.  . cetirizine  (ZYRTEC) 10 MG tablet Take 1 tablet (10 mg total) by mouth daily.  . fluticasone (FLONASE) 50 MCG/ACT nasal spray Place 2 sprays into both nostrils daily.  Marland Kitchen gabapentin (NEURONTIN) 300 MG capsule Take 1 capsule (300 mg total) by mouth 3 (three) times daily.  . hydrOXYzine (ATARAX/VISTARIL) 10 MG tablet Take 1 tablet (10 mg total) by mouth 3 (three) times daily as needed.  . Ibuprofen-Famotidine (DUEXIS) 800-26.6 MG TABS Take 1 tablet by mouth 3 (three) times daily as needed.  . Nebivolol HCl (BYSTOLIC) 20 MG TABS Take 1 tablet (20 mg total) by mouth daily.  Marland Kitchen omeprazole (PRILOSEC) 20 MG capsule TAKE 1 CAPSULE BY MOUTH EVERY DAY  . tiZANidine (ZANAFLEX) 4 MG tablet Take 1 tablet (4 mg total) by mouth every 8 (eight) hours as needed for muscle spasms.  . valACYclovir (VALTREX) 500 MG tablet Take 1 tablet (500 mg total) by mouth 2 (two) times daily. (Patient taking differently: Take 500 mg by mouth 2 (two) times daily. As needed outbreak)  . venlafaxine XR (EFFEXOR-XR) 75 MG 24 hr capsule Take 1 capsule (75 mg total) by mouth daily with breakfast. Must keep appt w/new provider for future refills  . Vitamin D, Ergocalciferol, (DRISDOL) 50000 units CAPS capsule TAKE 1 CAPSULE (50,000 UNITS TOTAL) BY MOUTH EVERY 7 (SEVEN) DAYS.    Allergies  Allergen Reactions  . Chantix [Varenicline] Nausea Only    Social History   Tobacco Use  . Smoking status: Current Every Day Smoker    Packs/day: 0.50    Years: 20.00    Pack years: 10.00    Types: Cigarettes  . Smokeless tobacco: Never Used  Substance Use Topics  . Alcohol use: Yes    Alcohol/week: 5.0 standard drinks    Types: 5 Glasses of wine per week  . Drug use: No   Social History   Social History Narrative   Fun: Puzzles, cats   Denies any religious beliefs effecting health care. Lives with daughter in a one story home.  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  Education: some college.    family history includes Arrhythmia in her sister;  Arthritis in her mother and sister; Brain cancer in her brother; Diabetes in her brother; Edema in her sister; Lung cancer in her father; Migraines in her mother; Ovarian cancer in her mother.  Wt Readings from Last 3 Encounters:  03/10/18 243 lb 9.6 oz (110.5 kg)  02/23/18 241 lb (109.3 kg)  02/17/18 243 lb (110.2 kg)    PHYSICAL EXAM BP 138/86   Pulse 75   Ht 5\' 9"  (1.753 m)   Wt 243 lb 9.6 oz (110.5 kg)   SpO2 94%   BMI 35.97 kg/m  Physical Exam  Constitutional: Jennifer Waters is oriented to person, place, and time. Jennifer Waters appears well-developed and well-nourished. No distress.  Moderately obese  HENT:  Head: Normocephalic and atraumatic.  Mouth/Throat: No oropharyngeal exudate.  Neck: Normal range of motion. Neck supple. No hepatojugular reflux and no JVD present. Carotid bruit is not present.  Cardiovascular: Normal rate, regular rhythm, S1 normal, S2 normal and normal pulses.  No extrasystoles are present. PMI is not displaced. Exam reveals distant heart sounds. Exam reveals no gallop and no friction rub.  No murmur heard. Pulmonary/Chest: Effort normal. No respiratory distress. Jennifer Waters has no wheezes. Jennifer Waters has no rales.  Somewhat distant breath sounds, but no obvious rales or rhonchi.  Mild interstitial sounds  Abdominal: Soft. Bowel sounds are normal. Jennifer Waters exhibits no distension. There is no tenderness. There is no rebound.  No HSM  Musculoskeletal: Normal range of motion. Jennifer Waters exhibits no edema.  Left index finger has a small 1 mm size induration from what looks like may be a retained glass shard.  Neurological: Jennifer Waters is alert and oriented to person, place, and time. No cranial nerve deficit.  Psychiatric: Jennifer Waters has a normal mood and affect. Her behavior is normal. Judgment and thought content normal.  Vitals reviewed.    Adult ECG Report  Rate: 74;  Rhythm: normal sinus rhythm and Normal axis, intervals and durations.  Nonspecific ST-T wave changes.;   Narrative Interpretation: Relatively  normal EKG   Other studies Reviewed: Additional studies/ records that were reviewed today include:  Recent Labs:    Lab Results  Component Value Date   CREATININE 0.92 11/04/2017   BUN 14 11/04/2017   NA 140 11/04/2017   K 4.1 11/04/2017   CL 107 11/04/2017   CO2 27 11/04/2017   Lab Results  Component Value Date   WBC 8.1 11/04/2017   HGB 14.1 11/04/2017   HCT 41.4 11/04/2017   MCV 89.7 11/04/2017   PLT 257.0 11/04/2017   Lab Results  Component Value Date   CHOL 187 11/04/2017   HDL 46.30 11/04/2017   LDLCALC 110 (H) 11/04/2017   TRIG 155.0 (H) 11/04/2017   CHOLHDL 4 11/04/2017    ASSESSMENT / PLAN: Problem List Items Addressed This Visit    Essential hypertension - Primary (Chronic)    Blood pressure much better today on current dose of Bystolic and amlodipine.  Continue current meds.      Obesity (BMI 35.0-39.9 without comorbidity)    Talked about dietary adjustment and increased exercise.      Snoring (Chronic)    There is concern about insomnia versus OSA.  Being followed by PCP.  Needs weight loss.  Low threshold to consider sleep study.      Tobacco abuse (Chronic)    Now on Wellbutrin for plan to help quitting cigarettes.  Still on the pulmonary phases, has not yet quit but is looking forward to having set quit date.         Current medicines are reviewed at length with the patient today. (+/- concerns) none The following changes have been made:None  Patient Instructions  NO MEDICATION CHANGES AT PRESENT.    Your physician wants you to follow-up in [place] DNP.You will receive a reminder letter in the mail two months in advance. If you don't receive a letter, please call our office to schedule the follow-up appointment.     Your physician wants you to follow-up in [place].You will receive a reminder letter in the mail two months in advance. If you don't receive a letter, please call our office to schedule  the follow-up appointment.   If you need a refill on your cardiac medications before your next appointment, please call your pharmacy.    Studies Ordered:   No orders of the defined types were placed in this encounter.     Glenetta Hew, M.D., M.S. Interventional  Cardiologist   Pager # [phone] Phone # [phone] [address] [place], [place] [address]   Thank you for choosing [facility] at [facility]!!

## 2018-03-12 ENCOUNTER — Encounter: Payer: Self-pay | Admitting: Cardiology

## 2018-03-12 NOTE — Assessment & Plan Note (Signed)
Now on Wellbutrin for plan to help quitting cigarettes.  Still on the pulmonary phases, has not yet quit but is looking forward to having set quit date.

## 2018-03-12 NOTE — Assessment & Plan Note (Signed)
Talked about dietary adjustment and increased exercise.

## 2018-03-12 NOTE — Assessment & Plan Note (Signed)
There is concern about insomnia versus OSA.  Being followed by PCP.  Needs weight loss.  Low threshold to consider sleep study.

## 2018-03-12 NOTE — Assessment & Plan Note (Signed)
Blood pressure much better today on current dose of Bystolic and amlodipine.  Continue current meds.

## 2018-03-13 ENCOUNTER — Ambulatory Visit
Admission: RE | Admit: 2018-03-13 | Discharge: 2018-03-13 | Disposition: A | Discharge status: Home / Self Care | Payer: BLUE CROSS/BLUE SHIELD | Source: Ambulatory Visit | Attending: Family Medicine | Admitting: Family Medicine

## 2018-03-13 DIAGNOSIS — M542 Cervicalgia: Secondary | ICD-10-CM

## 2018-03-13 MED ORDER — TRIAMCINOLONE ACETONIDE 40 MG/ML IJ SUSP (RADIOLOGY)
60.0000 mg | Freq: Once | INTRAMUSCULAR | Status: AC
  Administered 2018-03-13: 60 mg via EPIDURAL

## 2018-03-13 MED ORDER — IOPAMIDOL (ISOVUE-M 300) INJECTION 61%
1.0000 mL | Freq: Once | INTRAMUSCULAR | Status: AC | PRN
  Administered 2018-03-13: 1 mL via EPIDURAL

## 2018-03-13 NOTE — Discharge Instructions (Signed)

## 2018-03-17 ENCOUNTER — Ambulatory Visit: Payer: BLUE CROSS/BLUE SHIELD | Admitting: Psychology

## 2018-03-27 ENCOUNTER — Other Ambulatory Visit: Payer: Self-pay | Admitting: Family

## 2018-03-27 ENCOUNTER — Other Ambulatory Visit (INDEPENDENT_AMBULATORY_CARE_PROVIDER_SITE_OTHER): Payer: BLUE CROSS/BLUE SHIELD

## 2018-03-27 ENCOUNTER — Ambulatory Visit (INDEPENDENT_AMBULATORY_CARE_PROVIDER_SITE_OTHER): Payer: BLUE CROSS/BLUE SHIELD | Admitting: Family

## 2018-03-27 ENCOUNTER — Encounter: Payer: Self-pay | Admitting: Family

## 2018-03-27 VITALS — BP 132/80 | HR 82 | Temp 98.0°F | Ht 69.0 in | Wt 243.0 lb

## 2018-03-27 DIAGNOSIS — R252 Cramp and spasm: Secondary | ICD-10-CM | POA: Diagnosis not present

## 2018-03-27 DIAGNOSIS — Z1231 Encounter for screening mammogram for malignant neoplasm of breast: Secondary | ICD-10-CM

## 2018-03-27 DIAGNOSIS — Z1211 Encounter for screening for malignant neoplasm of colon: Secondary | ICD-10-CM

## 2018-03-27 LAB — COMPREHENSIVE METABOLIC PANEL
ALBUMIN: 4.2 g/dL (ref 3.5–5.2)
ALK PHOS: 72 U/L (ref 39–117)
ALT: 25 U/L (ref 0–35)
AST: 21 U/L (ref 0–37)
BUN: 18 mg/dL (ref 6–23)
CO2: 27 mEq/L (ref 19–32)
Calcium: 9.3 mg/dL (ref 8.4–10.5)
Chloride: 105 mEq/L (ref 96–112)
Creatinine, Ser: 0.96 mg/dL (ref 0.40–1.20)
GFR: 77.22 mL/min (ref >60.00)
Glucose, Bld: 120 mg/dL — ABNORMAL HIGH (ref 70–99)
Potassium: 4.1 mEq/L (ref 3.5–5.1)
SODIUM: 138 meq/L (ref 135–145)
TOTAL PROTEIN: 6.9 g/dL (ref 6.0–8.3)
Total Bilirubin: 0.4 mg/dL (ref 0.2–1.2)

## 2018-03-27 LAB — VITAMIN B12: Vitamin B-12: 371 pg/mL (ref 211–911)

## 2018-03-27 LAB — MAGNESIUM: Magnesium: 2.2 mg/dL (ref 1.5–2.5)

## 2018-03-27 LAB — VITAMIN D 25 HYDROXY (VIT D DEFICIENCY, FRACTURES): VITD: 26.03 ng/mL — ABNORMAL LOW (ref 30.00–100.00)

## 2018-03-27 MED ORDER — VITAMIN D (ERGOCALCIFEROL) 1.25 MG (50000 UNIT) PO CAPS: 50000.0000 [IU] | ORAL_CAPSULE | ORAL | 0 refills | Status: AC

## 2018-03-27 NOTE — Patient Instructions (Signed)

## 2018-03-27 NOTE — Progress Notes (Signed)
Jennifer Waters is a 57 y.o. female with the following history as recorded in EpicCare:  Patient Active Problem List   Diagnosis Date Noted  . Obesity (BMI 35.0-39.9 without comorbidity) 03/10/2018  . Nonallopathic lesion of cervical region 12/02/2017  . Nonallopathic lesion of thoracic region 12/02/2017  . Nonallopathic lesion of rib cage 12/02/2017  . Encounter for general adult medical examination with abnormal findings 11/04/2017  . Tobacco abuse 07/08/2017  . Anxiety and depression 11/22/2016  . Cervical disc disorder with radiculopathy of cervical region 05/04/2016  . Cough 03/15/2016  . Snoring 12/11/2015  . GERD (gastroesophageal reflux disease) 12/11/2015  . Allergic rhinitis 05/01/2015  . Osteoarthritis 10/17/2014  . Essential hypertension 10/17/2014    Current Outpatient Medications  Medication Sig Dispense Refill  . albuterol (PROVENTIL HFA;VENTOLIN HFA) 108 (90 Base) MCG/ACT inhaler Inhale 2 puffs into the lungs every 6 (six) hours as needed for wheezing or shortness of breath. 1 Inhaler 0  . amLODipine (NORVASC) 10 MG tablet Take 1 tablet (10 mg total) by mouth daily. 90 tablet 1  . buPROPion (WELLBUTRIN XL) 150 MG 24 hr tablet Take 1 tablet (150 mg total) by mouth daily. 90 tablet 0  . cetirizine (ZYRTEC) 10 MG tablet Take 1 tablet (10 mg total) by mouth daily. 90 tablet 0  . fluticasone (FLONASE) 50 MCG/ACT nasal spray Place 2 sprays into both nostrils daily. 16 g 6  . gabapentin (NEURONTIN) 300 MG capsule Take 1 capsule (300 mg total) by mouth 3 (three) times daily. 90 capsule 0  . hydrOXYzine (ATARAX/VISTARIL) 10 MG tablet Take 1 tablet (10 mg total) by mouth 3 (three) times daily as needed. 90 tablet 0  . Ibuprofen-Famotidine (DUEXIS) 800-26.6 MG TABS Take 1 tablet by mouth 3 (three) times daily as needed. 90 tablet 0  . Nebivolol HCl (BYSTOLIC) 20 MG TABS Take 1 tablet (20 mg total) by mouth daily. 90 tablet 1  . omeprazole (PRILOSEC) 20 MG capsule TAKE 1 CAPSULE  BY MOUTH EVERY DAY 90 capsule 0  . tiZANidine (ZANAFLEX) 4 MG tablet Take 1 tablet (4 mg total) by mouth every 8 (eight) hours as needed for muscle spasms. 30 tablet 0  . valACYclovir (VALTREX) 500 MG tablet Take 1 tablet (500 mg total) by mouth 2 (two) times daily. (Patient taking differently: Take 500 mg by mouth 2 (two) times daily. As needed outbreak) 6 tablet 3  . venlafaxine XR (EFFEXOR-XR) 75 MG 24 hr capsule Take 1 capsule (75 mg total) by mouth daily with breakfast. Must keep appt w/new provider for future refills 30 capsule 1   No current facility-administered medications for this visit.     Allergies: Chantix [varenicline]  Past Medical History:  Diagnosis Date  . Arthritis   . Hypertension   . Migraines     Past Surgical History:  Procedure Laterality Date  . ABDOMINAL HYSTERECTOMY      Family History  Problem Relation Age of Onset  . Arthritis Mother   . Ovarian cancer Mother   . Migraines Mother   . Lung cancer Father   . Arrhythmia Sister   . Brain cancer Brother   . Edema Sister   . Arthritis Sister   . Diabetes Brother     Social History   Tobacco Use  . Smoking status: Current Every Day Smoker    Packs/day: 0.50    Years: 20.00    Pack years: 10.00    Types: Cigarettes  . Smokeless tobacco: Never Used  Substance Use  Topics  . Alcohol use: Yes    Alcohol/week: 5.0 standard drinks    Types: 5 Glasses of wine per week    Subjective:  Patient had episode of "severe muscle cramps" on Friday; notes that the pain was so severe, she could not move her upper arms; used a muscle relaxer and called EMS; EMS came to her home/ evaluated her and thought her potassium was low; was told to start eating bananas- patient states that after eating 2 bananas could tell a marked difference; ate a 3rd banana later that night and has not had any cramps since Friday;   + smoker; Was laid off unexpectedly in August from her job at [employer];    Objective:  Vitals:    03/27/18 0912  BP: 132/80  Pulse: 82  Temp: 98 F (36.7 C)  TempSrc: Oral  SpO2: 95%  Weight: 243 lb (110.2 kg)  Height: '5\' 9"'  (1.753 m)    General: Well developed, well nourished, in no acute distress  Skin : Warm and dry.  Head: Normocephalic and atraumatic  Lungs: Respirations unlabored; clear to auscultation bilaterally without wheeze, rales, rhonchi  CVS exam: normal rate and regular rhythm.  Musculoskeletal: No deformities; no active joint inflammation  Extremities: No edema, cyanosis, clubbing  Vessels: Symmetric bilaterally  Neurologic: Alert and oriented; speech intact; face symmetrical; moves all extremities well; CNII-XII intact without focal deficit   Assessment:  1. Muscle cramps   2. Screening mammogram, encounter for   3. Encounter for screening colonoscopy     Plan:  1. Update labs today; follow-up to be determined; Orders updated for screening mammogram and colonoscopy.  No follow-ups on file.  Orders Placed This Encounter  Procedures  . MM Digital Screening    Standing Status:   Future    Standing Expiration Date:   05/28/2019    Order Specific Question:   Reason for Exam (SYMPTOM  OR DIAGNOSIS REQUIRED)    Answer:   screening mammogram    Order Specific Question:   Is the patient pregnant?    Answer:   No    Order Specific Question:   Preferred imaging location?    Answer:   [facility]  . Comp Met (CMET)    Standing Status:   Future    Standing Expiration Date:   03/27/2019  . Magnesium    Standing Status:   Future    Standing Expiration Date:   03/27/2019  . B12    Standing Status:   Future    Standing Expiration Date:   03/27/2019  . Vitamin D (25 hydroxy)    Standing Status:   Future    Standing Expiration Date:   03/27/2019  . Ambulatory referral to Gastroenterology    Referral Priority:   Routine    Referral Type:   Consultation    Referral Reason:   Specialty Services Required    Number of Visits Requested:   1    Requested  Prescriptions    No prescriptions requested or ordered in this encounter

## 2018-05-01 ENCOUNTER — Encounter: Payer: Self-pay | Admitting: Nurse Practitioner

## 2018-05-22 ENCOUNTER — Ambulatory Visit: Payer: BLUE CROSS/BLUE SHIELD | Admitting: Nurse Practitioner

## 2018-08-18 ENCOUNTER — Encounter: Payer: Self-pay | Admitting: Nurse Practitioner

## 2018-08-18 ENCOUNTER — Ambulatory Visit: Payer: PRIVATE HEALTH INSURANCE | Admitting: Nurse Practitioner

## 2018-08-18 DIAGNOSIS — I10 Essential (primary) hypertension: Secondary | ICD-10-CM

## 2018-08-18 MED ORDER — AMLODIPINE BESYLATE 10 MG PO TABS: 10.0000 mg | ORAL_TABLET | Freq: Every day | ORAL | 1 refills | Status: DC

## 2018-08-18 MED ORDER — NEBIVOLOL HCL 20 MG PO TABS: 20.0000 mg | ORAL_TABLET | Freq: Every day | ORAL | 1 refills | Status: DC

## 2018-08-18 NOTE — Progress Notes (Signed)
Jennifer Waters is a 58 y.o. female with the following history as recorded in EpicCare:  Patient Active Problem List   Diagnosis Date Noted  . Obesity (BMI 35.0-39.9 without comorbidity) 03/10/2018  . Nonallopathic lesion of cervical region 12/02/2017  . Nonallopathic lesion of thoracic region 12/02/2017  . Nonallopathic lesion of rib cage 12/02/2017  . Encounter for general adult medical examination with abnormal findings 11/04/2017  . Tobacco abuse 07/08/2017  . Anxiety and depression 11/22/2016  . Cervical disc disorder with radiculopathy of cervical region 05/04/2016  . Cough 03/15/2016  . Snoring 12/11/2015  . GERD (gastroesophageal reflux disease) 12/11/2015  . Allergic rhinitis 05/01/2015  . Osteoarthritis 10/17/2014  . Essential hypertension 10/17/2014    Current Outpatient Medications  Medication Sig Dispense Refill  . albuterol (PROVENTIL HFA;VENTOLIN HFA) 108 (90 Base) MCG/ACT inhaler Inhale 2 puffs into the lungs every 6 (six) hours as needed for wheezing or shortness of breath. 1 Inhaler 0  . amLODipine (NORVASC) 10 MG tablet Take 1 tablet (10 mg total) by mouth daily. 90 tablet 1  . buPROPion (WELLBUTRIN XL) 150 MG 24 hr tablet Take 1 tablet (150 mg total) by mouth daily. 90 tablet 0  . cetirizine (ZYRTEC) 10 MG tablet Take 1 tablet (10 mg total) by mouth daily. 90 tablet 0  . fluticasone (FLONASE) 50 MCG/ACT nasal spray Place 2 sprays into both nostrils daily. 16 g 6  . gabapentin (NEURONTIN) 300 MG capsule Take 1 capsule (300 mg total) by mouth 3 (three) times daily. 90 capsule 0  . hydrOXYzine (ATARAX/VISTARIL) 10 MG tablet Take 1 tablet (10 mg total) by mouth 3 (three) times daily as needed. 90 tablet 0  . Ibuprofen-Famotidine (DUEXIS) 800-26.6 MG TABS Take 1 tablet by mouth 3 (three) times daily as needed. 90 tablet 0  . Nebivolol HCl (BYSTOLIC) 20 MG TABS Take 1 tablet (20 mg total) by mouth daily. 90 tablet 1  . omeprazole (PRILOSEC) 20 MG capsule TAKE 1 CAPSULE  BY MOUTH EVERY DAY 90 capsule 0  . tiZANidine (ZANAFLEX) 4 MG tablet Take 1 tablet (4 mg total) by mouth every 8 (eight) hours as needed for muscle spasms. 30 tablet 0  . valACYclovir (VALTREX) 500 MG tablet Take 1 tablet (500 mg total) by mouth 2 (two) times daily. (Patient taking differently: Take 500 mg by mouth 2 (two) times daily. As needed outbreak) 6 tablet 3  . venlafaxine XR (EFFEXOR-XR) 75 MG 24 hr capsule Take 1 capsule (75 mg total) by mouth daily with breakfast. Must keep appt w/new provider for future refills 30 capsule 1   No current facility-administered medications for this visit.     Allergies: Chantix [varenicline]  Past Medical History:  Diagnosis Date  . Arthritis   . Hypertension   . Migraines     Past Surgical History:  Procedure Laterality Date  . ABDOMINAL HYSTERECTOMY      Family History  Problem Relation Age of Onset  . Arthritis Mother   . Ovarian cancer Mother   . Migraines Mother   . Lung cancer Father   . Arrhythmia Sister   . Brain cancer Brother   . Edema Sister   . Arthritis Sister   . Diabetes Brother     Social History   Tobacco Use  . Smoking status: Current Every Day Smoker    Packs/day: 0.50    Years: 20.00    Pack years: 10.00    Types: Cigarettes  . Smokeless tobacco: Never Used  Substance Use  Topics  . Alcohol use: Yes    Alcohol/week: 5.0 standard drinks    Types: 5 Glasses of wine per week     Subjective:   Jennifer Waters Is here today requesting refill of HTN meds- maintained on bystolic 20, amlodipine 10, actually was seeing  cardiology HTN clinic due to difficulty controlling her BP last year, but has lost her job now paying for her own insurance and has had trouble getting back in for appointments due to finances. She had been taking her bystolic and amlodipine daily as prescribed until about 2 weeks ago, ran out of amlodipine, has continued bystolic, has not noted any adverse effects to the medications, does not check her BP  readings at home. Denies syncope, headaches, vision changes, chest pain, shortness of breath, edema.  BP Readings from Last 3 Encounters:  08/18/18 (!) 140/100  03/27/18 132/80  03/13/18 (!) 143/82    ROS- See HPI  Objective:  Vitals:   08/18/18 0906  BP: (!) 140/100  Pulse: 79  SpO2: 95%  Weight: 252 lb (114.3 kg)  Height: 5\' 9"  (1.753 m)    General: Well developed, well nourished, in no acute distress  Skin : Warm and dry.  Head: Normocephalic and atraumatic  Eyes: Sclera and conjunctiva clear; pupils round and reactive to light; extraocular movements intact  Oropharynx: Pink, supple. No suspicious lesions  Neck: Supple without thyromegaly, adenopathy  Lungs: Respirations unlabored; clear to auscultation bilaterally without wheeze, rales, rhonchi  CVS exam: normal rate and regular rhythm, S1 and S2 normal.  Extremities: No edema, cyanosis, clubbing  Vessels: Symmetric bilaterally  Neurologic: Alert and oriented; speech intact; face symmetrical; moves all extremities well; CNII-XII intact without focal deficit  Psychiatric: Normal mood and affect.  Assessment:  1. Essential hypertension     Plan:   Return for CPE. (around May) No orders of the defined types were placed in this encounter.   Requested Prescriptions   Signed Prescriptions Disp Refills  . amLODipine (NORVASC) 10 MG tablet 90 tablet 1    Sig: Take 1 tablet (10 mg total) by mouth daily.  . Nebivolol HCl (BYSTOLIC) 20 MG TABS 90 tablet 1    Sig: Take 1 tablet (20 mg total) by mouth daily.

## 2018-08-18 NOTE — Patient Instructions (Addendum)
Resume medications daily as prescribed  Please try to check your blood pressure once daily or at least a few times a week, at the same time each day, and keep a log. Please follow up for readings >140/90  Otherwise return around may for CPE   DASH Eating Plan DASH stands for "Dietary Approaches to Stop Hypertension." The DASH eating plan is a healthy eating plan that has been shown to reduce high blood pressure (hypertension). It may also reduce your risk for type 2 diabetes, heart disease, and stroke. The DASH eating plan may also help with weight loss. What are tips for following this plan?  General guidelines  Avoid eating more than 2,300 mg (milligrams) of salt (sodium) a day. If you have hypertension, you may need to reduce your sodium intake to 1,500 mg a day.  Limit alcohol intake to no more than 1 drink a day for nonpregnant women and 2 drinks a day for men. One drink equals 12 oz of beer, 5 oz of wine, or 1 oz of hard liquor.  Work with your health care provider to maintain a healthy body weight or to lose weight. Ask what an ideal weight is for you.  Get at least 30 minutes of exercise that causes your heart to beat faster (aerobic exercise) most days of the week. Activities may include walking, swimming, or biking.  Work with your health care provider or diet and nutrition specialist (dietitian) to adjust your eating plan to your individual calorie needs. Reading food labels   Check food labels for the amount of sodium per serving. Choose foods with less than 5 percent of the Daily Value of sodium. Generally, foods with less than 300 mg of sodium per serving fit into this eating plan.  To find whole grains, look for the word "whole" as the first word in the ingredient list. Shopping  Buy products labeled as "low-sodium" or "no salt added."  Buy fresh foods. Avoid canned foods and premade or frozen meals. Cooking  Avoid adding salt when cooking. Use salt-free seasonings  or herbs instead of table salt or sea salt. Check with your health care provider or pharmacist before using salt substitutes.  Do not fry foods. Cook foods using healthy methods such as baking, boiling, grilling, and broiling instead.  Cook with heart-healthy oils, such as olive, canola, soybean, or sunflower oil. Meal planning  Eat a balanced diet that includes: ? 5 or more servings of fruits and vegetables each day. At each meal, try to fill half of your plate with fruits and vegetables. ? Up to 6-8 servings of whole grains each day. ? Less than 6 oz of lean meat, poultry, or fish each day. A 3-oz serving of meat is about the same size as a deck of cards. One egg equals 1 oz. ? 2 servings of low-fat dairy each day. ? A serving of nuts, seeds, or beans 5 times each week. ? Heart-healthy fats. Healthy fats called Omega-3 fatty acids are found in foods such as flaxseeds and coldwater fish, like sardines, salmon, and mackerel.  Limit how much you eat of the following: ? Canned or prepackaged foods. ? Food that is high in trans fat, such as fried foods. ? Food that is high in saturated fat, such as fatty meat. ? Sweets, desserts, sugary drinks, and other foods with added sugar. ? Full-fat dairy products.  Do not salt foods before eating.  Try to eat at least 2 vegetarian meals each week.  Eat  more home-cooked food and less restaurant, buffet, and fast food.  When eating at a restaurant, ask that your food be prepared with less salt or no salt, if possible. What foods are recommended? The items listed may not be a complete list. Talk with your dietitian about what dietary choices are best for you. Grains Whole-grain or whole-wheat bread. Whole-grain or whole-wheat pasta. Brown rice. [place]. Bulgur. Whole-grain and low-sodium cereals. Pita bread. Low-fat, low-sodium crackers. Whole-wheat flour tortillas. Vegetables Fresh or frozen vegetables (raw, steamed, roasted, or grilled).  Low-sodium or reduced-sodium tomato and vegetable juice. Low-sodium or reduced-sodium tomato sauce and tomato paste. Low-sodium or reduced-sodium canned vegetables. Fruits All fresh, dried, or frozen fruit. Canned fruit in natural juice (without added sugar). Meat and other protein foods Skinless chicken or [place]. Ground chicken or [place]. Pork with fat trimmed off. Fish and seafood. Egg whites. Dried beans, peas, or lentils. Unsalted nuts, nut butters, and seeds. Unsalted canned beans. Lean cuts of beef with fat trimmed off. Low-sodium, lean deli meat. Dairy Low-fat (1%) or fat-free (skim) milk. Fat-free, low-fat, or reduced-fat cheeses. Nonfat, low-sodium ricotta or cottage cheese. Low-fat or nonfat yogurt. Low-fat, low-sodium cheese. Fats and oils Soft margarine without trans fats. Vegetable oil. Low-fat, reduced-fat, or light mayonnaise and salad dressings (reduced-sodium). Canola, safflower, olive, soybean, and sunflower oils. Avocado. Seasoning and other foods Herbs. Spices. Seasoning mixes without salt. Unsalted popcorn and pretzels. Fat-free sweets. What foods are not recommended? The items listed may not be a complete list. Talk with your dietitian about what dietary choices are best for you. Grains Baked goods made with fat, such as croissants, muffins, or some breads. Dry pasta or rice meal packs. Vegetables Creamed or fried vegetables. Vegetables in a cheese sauce. Regular canned vegetables (not low-sodium or reduced-sodium). Regular canned tomato sauce and paste (not low-sodium or reduced-sodium). Regular tomato and vegetable juice (not low-sodium or reduced-sodium). Angie Fava. Olives. Fruits Canned fruit in a light or heavy syrup. Fried fruit. Fruit in cream or butter sauce. Meat and other protein foods Fatty cuts of meat. Ribs. Fried meat. Berniece Salines. Sausage. Bologna and other processed lunch meats. Salami. Fatback. Hotdogs. Bratwurst. Salted nuts and seeds. Canned beans with added  salt. Canned or smoked fish. Whole eggs or egg yolks. Chicken or [place] with skin. Dairy Whole or 2% milk, cream, and half-and-half. Whole or full-fat cream cheese. Whole-fat or sweetened yogurt. Full-fat cheese. Nondairy creamers. Whipped toppings. Processed cheese and cheese spreads. Fats and oils Butter. Stick margarine. Lard. Shortening. Ghee. Bacon fat. Tropical oils, such as coconut, palm kernel, or palm oil. Seasoning and other foods Salted popcorn and pretzels. Onion salt, garlic salt, seasoned salt, table salt, and sea salt. Worcestershire sauce. Tartar sauce. Barbecue sauce. Teriyaki sauce. Soy sauce, including reduced-sodium. Steak sauce. Canned and packaged gravies. Fish sauce. Oyster sauce. Cocktail sauce. Horseradish that you find on the shelf. Ketchup. Mustard. Meat flavorings and tenderizers. Bouillon cubes. Hot sauce and Tabasco sauce. Premade or packaged marinades. Premade or packaged taco seasonings. Relishes. Regular salad dressings. Where to find more information:  National Heart, Lung, and [place]: https://wilson-eaton.com/  American Heart Association: www.heart.org Summary  The DASH eating plan is a healthy eating plan that has been shown to reduce high blood pressure (hypertension). It may also reduce your risk for type 2 diabetes, heart disease, and stroke.  With the DASH eating plan, you should limit salt (sodium) intake to 2,300 mg a day. If you have hypertension, you may need to reduce your sodium intake to  1,500 mg a day.  When on the DASH eating plan, aim to eat more fresh fruits and vegetables, whole grains, lean proteins, low-fat dairy, and heart-healthy fats.  Work with your health care provider or diet and nutrition specialist (dietitian) to adjust your eating plan to your individual calorie needs. This information is not intended to replace advice given to you by your health care provider. Make sure you discuss any questions you have with your health care  provider. Document Released: 05/27/2011 Document Revised: 05/31/2016 Document Reviewed: 05/31/2016 Elsevier Interactive Patient Education  2019 [employer].

## 2018-08-18 NOTE — Assessment & Plan Note (Addendum)
BP elevated, off amlodipine Refills sent with instructions to resume RTC in 3 months for CPE. Or sooner for BP readings >140/90 after resuming meds  additional education on AVS - amLODipine (NORVASC) 10 MG tablet; Take 1 tablet (10 mg total) by mouth daily.  Dispense: 90 tablet; Refill: 1 - Nebivolol HCl (BYSTOLIC) 20 MG TABS; Take 1 tablet (20 mg total) by mouth daily.  Dispense: 90 tablet; Refill: 1

## 2018-10-10 ENCOUNTER — Telehealth: Payer: Self-pay | Admitting: Nurse Practitioner

## 2018-10-10 DIAGNOSIS — I10 Essential (primary) hypertension: Secondary | ICD-10-CM

## 2018-10-10 DIAGNOSIS — F329 Major depressive disorder, single episode, unspecified: Secondary | ICD-10-CM

## 2018-10-10 DIAGNOSIS — F419 Anxiety disorder, unspecified: Principal | ICD-10-CM

## 2018-10-10 DIAGNOSIS — F32A Depression, unspecified: Secondary | ICD-10-CM

## 2018-10-10 MED ORDER — NEBIVOLOL HCL 20 MG PO TABS: 20.0000 mg | ORAL_TABLET | Freq: Every day | ORAL | 0 refills | Status: DC

## 2018-10-10 MED ORDER — VENLAFAXINE HCL ER 75 MG PO CP24: 75.0000 mg | ORAL_CAPSULE | Freq: Every day | ORAL | 1 refills | Status: DC

## 2018-10-10 NOTE — Addendum Note (Signed)
Addended by: Cresenciano Lick on: 10/10/2018 03:53 PM   Modules accepted: Orders

## 2018-10-10 NOTE — Telephone Encounter (Signed)
Patient called and stated that she does not have insurance right now. Her Bystolic is to expensive for her to afford and she would like to be switched to something affordable. She also wants a refill on Effexor.

## 2018-10-10 NOTE — Telephone Encounter (Signed)
I called pt- she is requesting Bystolic samples and a refill on Effexor. I advised her she may need a virtual visit with another provider in May instead of a CPE due to COVID-19 pandemic. Pt states she will call back to schedule a visit. Bystolic  20 mg samples are upfront. Effexor refill sent. See meds.

## 2018-11-07 ENCOUNTER — Telehealth: Payer: Self-pay | Admitting: Nurse Practitioner

## 2018-11-07 DIAGNOSIS — M159 Polyosteoarthritis, unspecified: Secondary | ICD-10-CM

## 2018-11-07 MED ORDER — IBUPROFEN-FAMOTIDINE 800-26.6 MG PO TABS: 1.0000 | ORAL_TABLET | Freq: Three times a day (TID) | ORAL | 1 refills | Status: DC | PRN

## 2018-11-07 NOTE — Telephone Encounter (Signed)
Done erx 

## 2018-11-07 NOTE — Telephone Encounter (Signed)
Copied from [place] [id number]. Topic: Quick Communication - Rx Refill/Question >> Nov 07, 2018  1:32 PM Oneta Rack wrote: Medication: Ibuprofen-Famotidine (DUEXIS) 800-26.6 MG TABS    Preferred Pharmacy (with phone number or street name):   [id number] - [place], [place] [phone] (Phone) [phone] (Fax)   Agent: Please be advised that RX refills may take up to 3 business days. We ask that you follow-up with your pharmacy.

## 2018-11-07 NOTE — Addendum Note (Signed)
Addended by: Biagio Borg on: 11/07/2018 07:19 PM   Modules accepted: Orders

## 2018-11-10 MED ORDER — DICLOFENAC SODIUM 75 MG PO TBEC: 75.0000 mg | DELAYED_RELEASE_TABLET | Freq: Two times a day (BID) | ORAL | 5 refills | Status: DC | PRN

## 2018-11-10 MED ORDER — PANTOPRAZOLE SODIUM 40 MG PO TBEC: 40.0000 mg | DELAYED_RELEASE_TABLET | Freq: Every day | ORAL | 3 refills | Status: DC

## 2018-11-10 NOTE — Telephone Encounter (Signed)
Ok, this has been changed

## 2018-11-10 NOTE — Addendum Note (Signed)
Addended by: Biagio Borg on: 11/10/2018 12:41 PM   Modules accepted: Orders

## 2018-11-10 NOTE — Telephone Encounter (Signed)
Patient says the medication was $2000 and the pharmacy recommended the prescribing doctor to split the medication into two separate scripts. Please advise.

## 2018-12-13 IMAGING — MR MR CERVICAL SPINE W/O CM
4 of 5 series · 27 of 48 positions shown · non-contrast
Comparison: Is cervical spine radiographs 07/08/2017 and 04/22/2016

CLINICAL DATA: Cervicalgia. Bilateral upper extremity
radiculopathy.

EXAM:
MRI CERVICAL SPINE WITHOUT CONTRAST
TECHNIQUE: Multiplanar, multisequence MR imaging of the cervical spine was
performed. No intravenous contrast was administered.

[Series 6: T1 · sagittal · 3.0mm · 0.66mm/px · 6 of 15 slices shown]
[im 1/15]
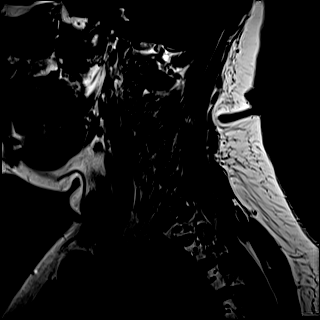
[im 3/15]
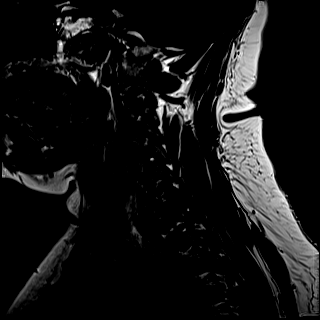
[im 6/15]
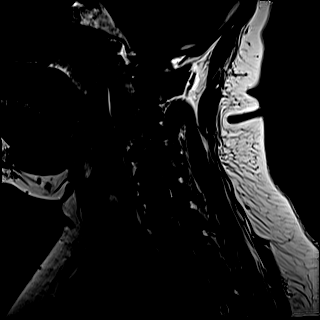
[im 9/15]
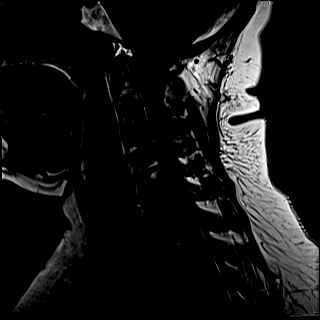
[im 12/15]
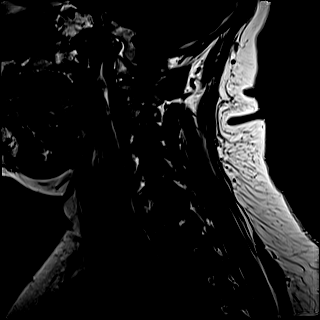
[im 15/15]
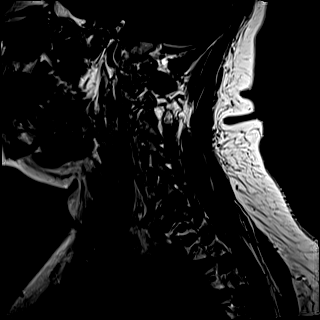

[Series 7: T2 · sagittal · 3.0mm · 0.55mm/px · 7 of 15 slices shown (1 of 2)]
[im 1/15]
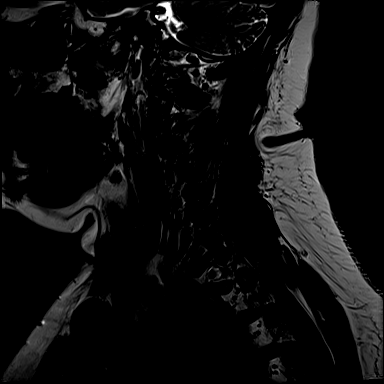
[im 3/15]
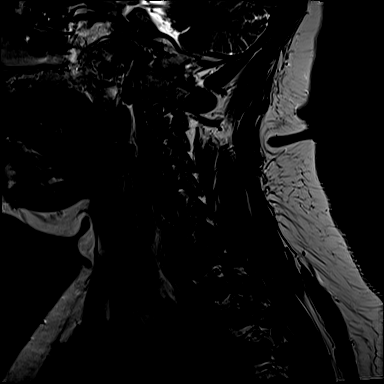
[im 5/15]
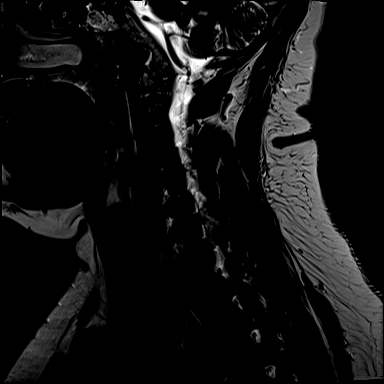
[im 8/15]
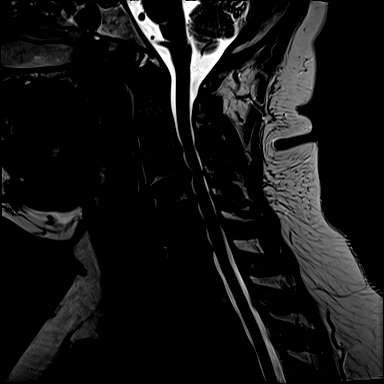
[im 10/15]
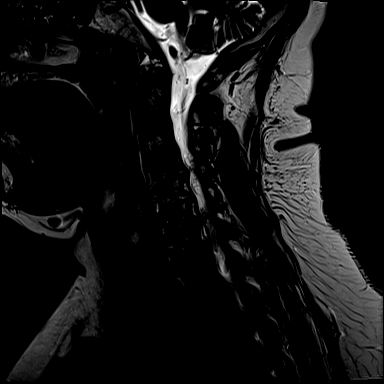
[im 12/15]
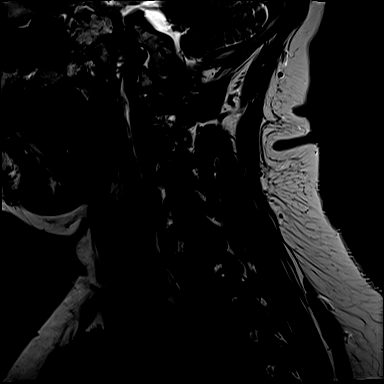
[im 15/15]
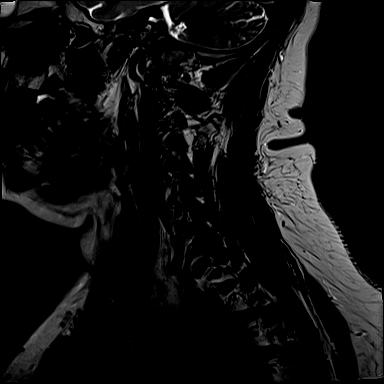

[Series 8: STIR · sagittal · 3.0mm · 0.33mm/px · 6 of 15 slices shown]
[im 1/15]
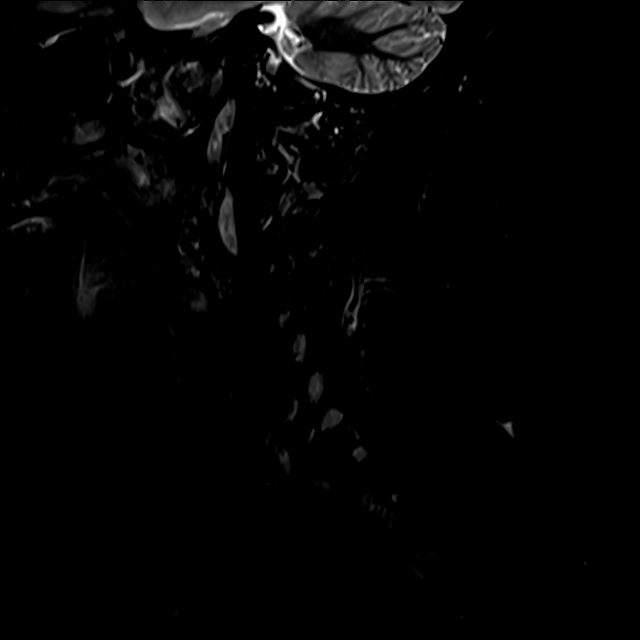
[im 3/15]
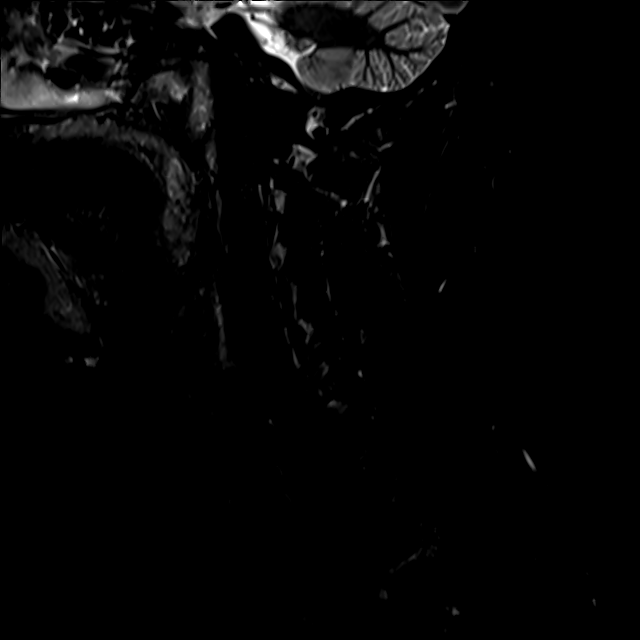
[im 5/15]
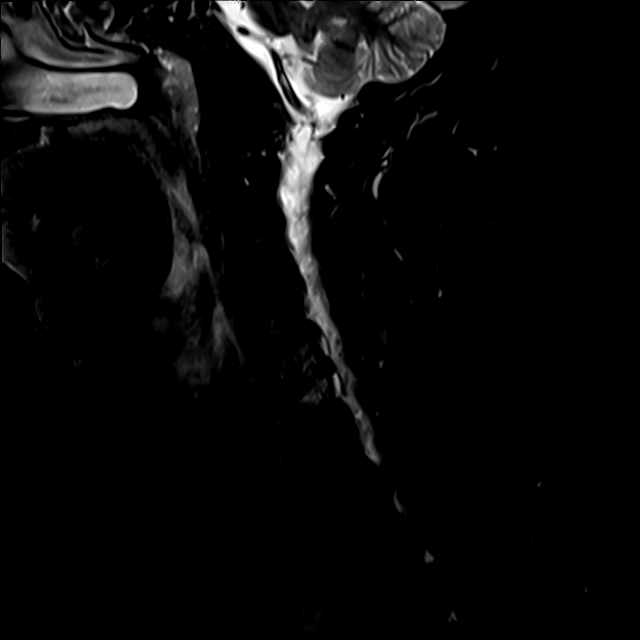
[im 8/15]
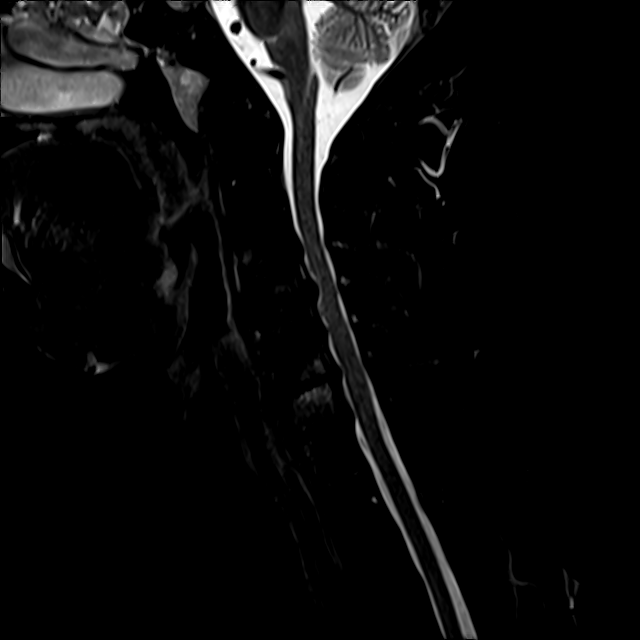
[im 10/15]
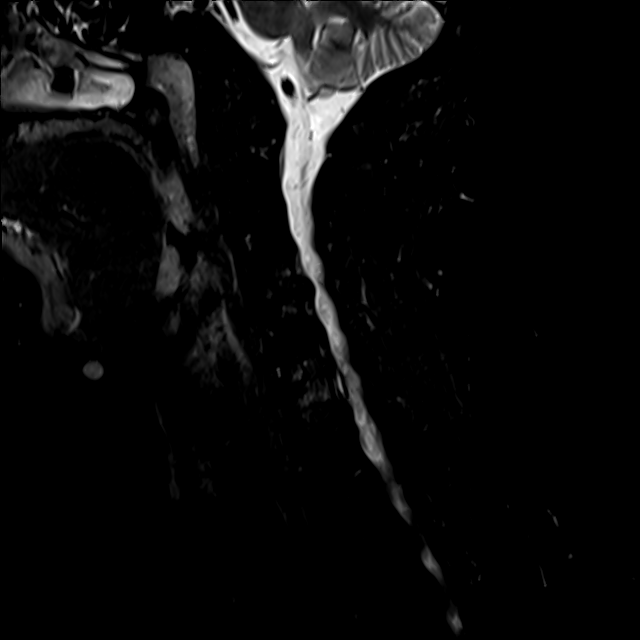
[im 12/15]
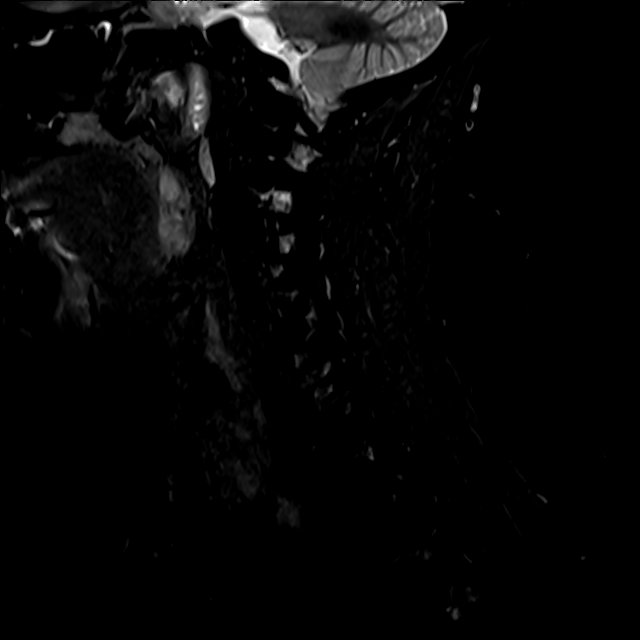

[Series 9: T2 · axial · 3.0mm · 0.50mm/px · z∈[-44,+48]mm · 8 of 30 slices shown (2 of 2)]
[im 1/30]
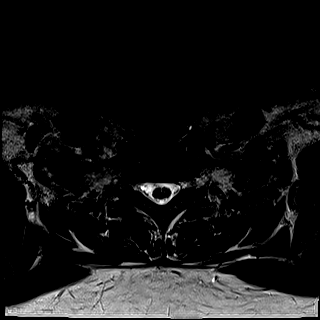
[im 5/30]
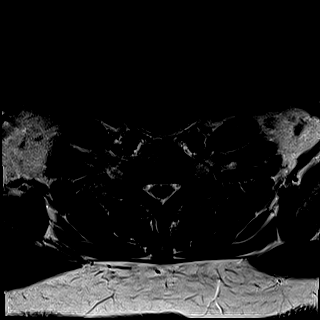
[im 9/30]
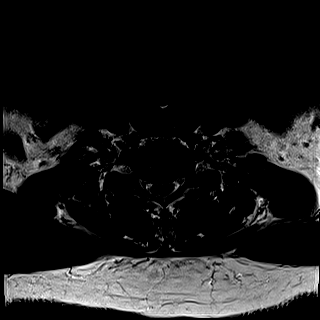
[im 14/30]
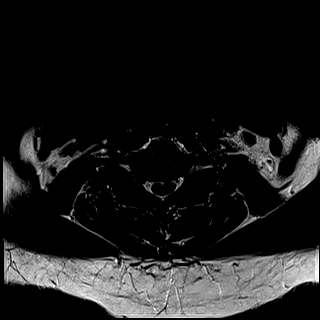
[im 16/30]
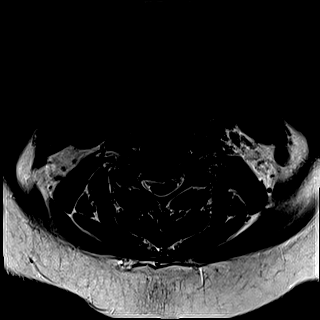
[im 21/30]
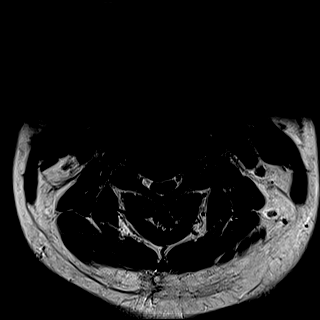
[im 25/30]
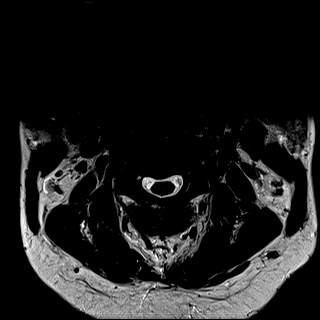
[im 30/30]
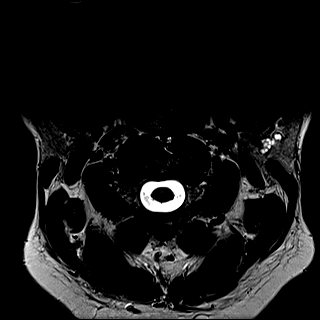

[27 of 48 positions shown; findings below may reference images not displayed]

FINDINGS: Alignment: AP alignment is anatomic. There straightening of the
normal cervical lordosis.

Vertebrae: Vertebral body heights are maintained. Chronic endplate
marrow changes are noted at C4-5 left C5-6, and C6-7.

Cord: Normal signal is present in the cervical and upper thoracic
spinal cord to the lowest imaged level, T3-4.

Posterior Fossa, vertebral arteries, paraspinal tissues:
Craniocervical junction is normal. Visualized intracranial contents
are normal. Flow is present in the vertebral arteries bilaterally.

Disc levels:

C2-3: A shallow disc protrusion is present without significant
stenosis.

C3-4: A broad-based disc osteophyte complex effaces the ventral CSF
with slight distortion of the ventral surface of the cord. The canal
is narrowed seven mm. Moderate foraminal narrowing is present
bilaterally.

C4-5: A broad-based disc protrusion is present. There is distortion
of the ventral surface of the cord without abnormal signal. The
canal is narrowed to 6 mm. Severe foraminal stenosis is secondary to
uncovertebral disease, right greater than left.

C5-6: A broad-based disc protrusion is present. A left paramedian
component contacts the ventral surface the cord without significant
distortion. Canal is narrowed to 8 mm. Severe right and moderate
left foraminal narrowing is present.

C6-7: A broad-based disc osteophyte complex effaces the ventral CSF
without significant distortion of the cord. The canal is narrowed to
8 mm. Moderate foraminal narrowing is present bilaterally.

C7-T1: Uncovertebral spurring contributes to moderate bilateral
foraminal narrowing. The central canal is patent.

T1-2: Negative.
IMPRESSION: 1. Moderate central canal stenosis at C3-4 and C4-5 most
significantly.
2. Moderate foraminal narrowing bilaterally at C3-4.
3. Severe foraminal narrowing at C4-5 is worse on the right.
4. Mild central canal narrowing at C5-6 and C6-7.
5. Severe right and moderate left foraminal narrowing at C5-6.
6. Moderate foraminal narrowing bilaterally at C6-7 and C7-T1.

## 2018-12-20 ENCOUNTER — Encounter: Payer: Self-pay | Admitting: Internal Medicine

## 2018-12-20 ENCOUNTER — Ambulatory Visit (INDEPENDENT_AMBULATORY_CARE_PROVIDER_SITE_OTHER): Payer: PRIVATE HEALTH INSURANCE | Admitting: Internal Medicine

## 2018-12-20 ENCOUNTER — Other Ambulatory Visit (INDEPENDENT_AMBULATORY_CARE_PROVIDER_SITE_OTHER): Payer: PRIVATE HEALTH INSURANCE

## 2018-12-20 ENCOUNTER — Other Ambulatory Visit: Payer: Self-pay

## 2018-12-20 ENCOUNTER — Telehealth: Payer: Self-pay

## 2018-12-20 ENCOUNTER — Other Ambulatory Visit: Payer: Self-pay | Admitting: Internal Medicine

## 2018-12-20 VITALS — BP 126/82 | HR 92 | Temp 98.5°F | Ht 69.0 in | Wt 239.0 lb

## 2018-12-20 DIAGNOSIS — R739 Hyperglycemia, unspecified: Secondary | ICD-10-CM | POA: Diagnosis not present

## 2018-12-20 DIAGNOSIS — Z0001 Encounter for general adult medical examination with abnormal findings: Secondary | ICD-10-CM

## 2018-12-20 DIAGNOSIS — E611 Iron deficiency: Secondary | ICD-10-CM

## 2018-12-20 DIAGNOSIS — M5412 Radiculopathy, cervical region: Secondary | ICD-10-CM | POA: Insufficient documentation

## 2018-12-20 DIAGNOSIS — F419 Anxiety disorder, unspecified: Secondary | ICD-10-CM | POA: Diagnosis not present

## 2018-12-20 DIAGNOSIS — E538 Deficiency of other specified B group vitamins: Secondary | ICD-10-CM

## 2018-12-20 DIAGNOSIS — M159 Polyosteoarthritis, unspecified: Secondary | ICD-10-CM | POA: Diagnosis not present

## 2018-12-20 DIAGNOSIS — E559 Vitamin D deficiency, unspecified: Secondary | ICD-10-CM

## 2018-12-20 DIAGNOSIS — F32A Depression, unspecified: Secondary | ICD-10-CM

## 2018-12-20 DIAGNOSIS — R55 Syncope and collapse: Secondary | ICD-10-CM | POA: Diagnosis not present

## 2018-12-20 DIAGNOSIS — M25511 Pain in right shoulder: Secondary | ICD-10-CM | POA: Diagnosis not present

## 2018-12-20 DIAGNOSIS — I10 Essential (primary) hypertension: Secondary | ICD-10-CM

## 2018-12-20 DIAGNOSIS — F329 Major depressive disorder, single episode, unspecified: Secondary | ICD-10-CM

## 2018-12-20 HISTORY — PX: TRANSTHORACIC ECHOCARDIOGRAM: SHX275

## 2018-12-20 LAB — URINALYSIS, ROUTINE W REFLEX MICROSCOPIC
Hgb urine dipstick: NEGATIVE
Ketones, ur: NEGATIVE
Leukocytes,Ua: NEGATIVE
Nitrite: NEGATIVE
Specific Gravity, Urine: >=1.03 — AB (ref 1.000–1.030)
Urine Glucose: NEGATIVE
Urobilinogen, UA: 0.2 (ref 0.0–1.0)
pH: 5.5 (ref 5.0–8.0)

## 2018-12-20 LAB — HEMOGLOBIN A1C: Hgb A1c MFr Bld: 5.9 % (ref 4.6–6.5)

## 2018-12-20 LAB — HEPATIC FUNCTION PANEL
ALT: 18 U/L (ref 0–35)
AST: 19 U/L (ref 0–37)
Albumin: 4.6 g/dL (ref 3.5–5.2)
Alkaline Phosphatase: 67 U/L (ref 39–117)
Bilirubin, Direct: 0.1 mg/dL (ref 0.0–0.3)
Total Bilirubin: 0.6 mg/dL (ref 0.2–1.2)
Total Protein: 7.3 g/dL (ref 6.0–8.3)

## 2018-12-20 LAB — CBC WITH DIFFERENTIAL/PLATELET
Basophils Absolute: 0.2 10*3/uL — ABNORMAL HIGH (ref 0.0–0.1)
Basophils Relative: 1.4 % (ref 0.0–3.0)
Eosinophils Absolute: 0.2 10*3/uL (ref 0.0–0.7)
Eosinophils Relative: 1.5 % (ref 0.0–5.0)
HCT: 43.7 % (ref 36.0–46.0)
Hemoglobin: 14.5 g/dL (ref 12.0–15.0)
Lymphocytes Relative: 34.1 % (ref 12.0–46.0)
Lymphs Abs: 4.2 10*3/uL — ABNORMAL HIGH (ref 0.7–4.0)
MCHC: 33.2 g/dL (ref 30.0–36.0)
MCV: 90.5 fl (ref 78.0–100.0)
Monocytes Absolute: 0.9 10*3/uL (ref 0.1–1.0)
Monocytes Relative: 7.4 % (ref 3.0–12.0)
Neutro Abs: 6.8 10*3/uL (ref 1.4–7.7)
Neutrophils Relative %: 55.6 % (ref 43.0–77.0)
Platelets: 283 10*3/uL (ref 150.0–400.0)
RBC: 4.84 Mil/uL (ref 3.87–5.11)
RDW: 13.5 % (ref 11.5–15.5)
WBC: 12.2 10*3/uL — ABNORMAL HIGH (ref 4.0–10.5)

## 2018-12-20 LAB — TSH: TSH: 0.93 u[IU]/mL (ref 0.35–4.50)

## 2018-12-20 LAB — LIPID PANEL
Cholesterol: 176 mg/dL (ref 0–200)
HDL: 42.6 mg/dL (ref >39.00)
LDL Cholesterol: 103 mg/dL — ABNORMAL HIGH (ref 0–99)
NonHDL: 133.53
Total CHOL/HDL Ratio: 4
Triglycerides: 154 mg/dL — ABNORMAL HIGH (ref 0.0–149.0)
VLDL: 30.8 mg/dL (ref 0.0–40.0)

## 2018-12-20 LAB — IBC PANEL
Iron: 91 ug/dL (ref 42–145)
Saturation Ratios: 22.3 % (ref 20.0–50.0)
Transferrin: 291 mg/dL (ref 212.0–360.0)

## 2018-12-20 LAB — BASIC METABOLIC PANEL
BUN: 15 mg/dL (ref 6–23)
CO2: 26 mEq/L (ref 19–32)
Calcium: 9.8 mg/dL (ref 8.4–10.5)
Chloride: 108 mEq/L (ref 96–112)
Creatinine, Ser: 1.06 mg/dL (ref 0.40–1.20)
GFR: 64.63 mL/min (ref >60.00)
Glucose, Bld: 101 mg/dL — ABNORMAL HIGH (ref 70–99)
Potassium: 4.1 mEq/L (ref 3.5–5.1)
Sodium: 142 mEq/L (ref 135–145)

## 2018-12-20 LAB — VITAMIN B12: Vitamin B-12: 464 pg/mL (ref 211–911)

## 2018-12-20 LAB — VITAMIN D 25 HYDROXY (VIT D DEFICIENCY, FRACTURES): VITD: 14.8 ng/mL — ABNORMAL LOW (ref 30.00–100.00)

## 2018-12-20 MED ORDER — VITAMIN D (ERGOCALCIFEROL) 1.25 MG (50000 UNIT) PO CAPS: 50000.0000 [IU] | ORAL_CAPSULE | ORAL | 0 refills | Status: DC

## 2018-12-20 MED ORDER — BYSTOLIC 20 MG PO TABS: 20.0000 mg | ORAL_TABLET | Freq: Every day | ORAL | 3 refills | Status: DC

## 2018-12-20 MED ORDER — DICLOFENAC SODIUM 1 % TD GEL: 2.0000 g | Freq: Four times a day (QID) | TRANSDERMAL | 3 refills | Status: DC

## 2018-12-20 MED ORDER — VALACYCLOVIR HCL 500 MG PO TABS: 500.0000 mg | ORAL_TABLET | Freq: Two times a day (BID) | ORAL | 3 refills | Status: DC

## 2018-12-20 MED ORDER — CITALOPRAM HYDROBROMIDE 10 MG PO TABS: 10.0000 mg | ORAL_TABLET | Freq: Every day | ORAL | 3 refills | Status: DC

## 2018-12-20 NOTE — Telephone Encounter (Signed)
-----   Message from Biagio Borg, MD sent at 12/20/2018 12:48 PM EDT ----- Left message on MyChart, pt to cont same tx except  The Vit D level is low  Please take Vitamin D 50000 units weekly for 12 weeks, then plan to change to OTC Vitamin D3 at 2000 units per day, indefinitely.  Shirron to please inform pt, I will do rx

## 2018-12-20 NOTE — Telephone Encounter (Signed)
Pt has been informed of results and expressed understanding.  °

## 2018-12-20 NOTE — Progress Notes (Signed)
Subjective:    Patient ID: Jennifer Waters, female    DOB: 07-May-1961, 58 y.o.   MRN: [id number]  HPI  Here for wellness and f/u;  Overall doing ok;  Pt denies Chest pain, worsening SOB, DOE, wheezing, orthopnea, PND, worsening LE edema, palpitations, dizziness.  Pt denies neurological change such as new headache, facial or extremity weakness.  Pt denies polydipsia, polyuria, or low sugar symptoms. Pt states overall good compliance with treatment and medications, good tolerability, and has been trying to follow appropriate diet. . No fever, night sweats, wt loss, loss of appetite, or other constitutional symptoms.  Pt states good ability with ADL's, has low fall risk, home safety reviewed and adequate, no other significant changes in hearing or vision, and only occasionally active with exercise.   Also, Has been frustrated since being laid off job end of aug 2019. Now c/o worsening depressive symptoms for over 2-3 mo, but no SI or HI or panic.  Also c/o very brief episodes x 3 over the last year of LOC assoc with falls, the last one over 1 mo ago, assoc with brusiing to the left flank area now resolved, just seems to "go out" without assoc symptoms.  None witnessed, no known siezure related, no incontinence.  Also c/o left 5th finger oa pain daily intermittent mild to mod without loss of grip strength, fever or injury. Also c/o recent worsening 1 mo right shoulder pain, worse at night especially to lie flat or on left side; better with sleeping on right side with right arm straight up above .  Mod to severe at times. Also c/o Lumbar arthritis with sciatica - Pt continues to have recurring LBP without change in severity, bowel or bladder change, fever, wt loss,  worsening LE pain/numbness/weakness.     Past Medical History:  Diagnosis Date  . Arthritis   . Hypertension   . Migraines    Past Surgical History:  Procedure Laterality Date  . ABDOMINAL HYSTERECTOMY      reports that she has been  smoking cigarettes. She has a 10.00 pack-year smoking history. She has never used smokeless tobacco. She reports current alcohol use of about 5.0 standard drinks of alcohol per week. She reports that she does not use drugs. family history includes Arrhythmia in her sister; Arthritis in her mother and sister; Brain cancer in her brother; Diabetes in her brother; Edema in her sister; Lung cancer in her father; Migraines in her mother; Ovarian cancer in her mother. Allergies  Allergen Reactions  . Chantix [Varenicline] Nausea Only   Current Outpatient Medications on File Prior to Visit  Medication Sig Dispense Refill  . albuterol (PROVENTIL HFA;VENTOLIN HFA) 108 (90 Base) MCG/ACT inhaler Inhale 2 puffs into the lungs every 6 (six) hours as needed for wheezing or shortness of breath. 1 Inhaler 0  . amLODipine (NORVASC) 10 MG tablet Take 1 tablet (10 mg total) by mouth daily. 90 tablet 1  . cetirizine (ZYRTEC) 10 MG tablet Take 1 tablet (10 mg total) by mouth daily. 90 tablet 0  . diclofenac (VOLTAREN) 75 MG EC tablet Take 1 tablet (75 mg total) by mouth 2 (two) times daily as needed. 60 tablet 5  . fluticasone (FLONASE) 50 MCG/ACT nasal spray Place 2 sprays into both nostrils daily. 16 g 6  . gabapentin (NEURONTIN) 300 MG capsule Take 1 capsule (300 mg total) by mouth 3 (three) times daily. 90 capsule 0  . hydrOXYzine (ATARAX/VISTARIL) 10 MG tablet Take 1 tablet (10 mg  total) by mouth 3 (three) times daily as needed. 90 tablet 0  . omeprazole (PRILOSEC) 20 MG capsule TAKE 1 CAPSULE BY MOUTH EVERY DAY 90 capsule 0  . pantoprazole (PROTONIX) 40 MG tablet Take 1 tablet (40 mg total) by mouth daily. 90 tablet 3  . tiZANidine (ZANAFLEX) 4 MG tablet Take 1 tablet (4 mg total) by mouth every 8 (eight) hours as needed for muscle spasms. 30 tablet 0  . venlafaxine XR (EFFEXOR-XR) 75 MG 24 hr capsule Take 1 capsule (75 mg total) by mouth daily with breakfast. Needs visit for future refills. 30 capsule 1   No  current facility-administered medications on file prior to visit.    Review of Systems Constitutional: Negative for other unusual diaphoresis, sweats, appetite or weight changes HENT: Negative for other worsening hearing loss, ear pain, facial swelling, mouth sores or neck stiffness.   Eyes: Negative for other worsening pain, redness or other visual disturbance.  Respiratory: Negative for other stridor or swelling Cardiovascular: Negative for other palpitations or other chest pain  Gastrointestinal: Negative for worsening diarrhea or loose stools, blood in stool, distention or other pain Genitourinary: Negative for hematuria, flank pain or other change in urine volume.  Musculoskeletal: Negative for myalgias or other joint swelling.  Skin: Negative for other color change, or other wound or worsening drainage.  Neurological: Negative for other syncope or numbness. Hematological: Negative for other adenopathy or swelling Psychiatric/Behavioral: Negative for hallucinations, other worsening agitation, SI, self-injury, or new decreased concentration\ All other system neg per pt     Objective:   Physical Exam BP 126/82   Pulse 92   Temp 98.5 F (36.9 C) (Oral)   Ht 5\' 9"  (1.753 m)   Wt 239 lb (108.4 kg)   SpO2 94%   BMI 35.29 kg/m  VS noted,  Constitutional: Pt is oriented to person, place, and time. Appears well-developed and well-nourished, in no significant distress and comfortable Head: Normocephalic and atraumatic  Eyes: Conjunctivae and EOM are normal. Pupils are equal, round, and reactive to light Right Ear: External ear normal without discharge Left Ear: External ear normal without discharge Nose: Nose without discharge or deformity Mouth/Throat: Oropharynx is without other ulcerations and moist  Neck: Normal range of motion. Neck supple. No JVD present. No tracheal deviation present or significant neck LA or mass Cardiovascular: Normal rate, regular rhythm, normal heart sounds  and intact distal pulses.   Pulmonary/Chest: WOB normal and breath sounds without rales or wheezing  Abdominal: Soft. Bowel sounds are normal. NT. No HSM  Musculoskeletal: Normal range of motion. Exhibits no edema, right shoulder with tender subacromial, decresaed rom on abduction Lymphadenopathy: Has no other cervical adenopathy.  Neurological: Pt is alert and oriented to person, place, and time. Pt has normal reflexes. No cranial nerve deficit. Motor grossly intact, Gait intact Skin: Skin is warm and dry. No rash noted or new ulcerations Psychiatric:  Has normal mood and affect. Behavior is normal without agitation No other exam findings Lab Results  Component Value Date   WBC 12.2 (H) 12/20/2018   HGB 14.5 12/20/2018   HCT 43.7 12/20/2018   PLT 283.0 12/20/2018   GLUCOSE 101 (H) 12/20/2018   CHOL 176 12/20/2018   TRIG 154.0 (H) 12/20/2018   HDL 42.60 12/20/2018   LDLCALC 103 (H) 12/20/2018   ALT 18 12/20/2018   AST 19 12/20/2018   NA 142 12/20/2018   K 4.1 12/20/2018   CL 108 12/20/2018   CREATININE 1.06 12/20/2018   BUN  15 12/20/2018   CO2 26 12/20/2018   TSH 0.93 12/20/2018   HGBA1C 5.9 12/20/2018   ECG today I have personally interpreted:  NSR 69     Assessment & Plan:

## 2018-12-20 NOTE — Assessment & Plan Note (Signed)
Chronic per [facility]

## 2018-12-20 NOTE — Patient Instructions (Addendum)
Your EKG was OK today  Please take all new medication as prescribed  - the celexa 10 mg per depression, and the Voltaren gel as needed for pain  Please continue all other medications as before, and refills have been done if requested.  Please have the pharmacy call with any other refills you may need.  Please continue your efforts at being more active, low cholesterol diet, and weight control.  You are otherwise up to date with prevention measures today.  Please keep your appointments with your specialists as you may have planned  You will be contacted regarding the referral for: Dr Tamala Julian for the right shoulder, and the Echocardiogram, and the Colonoscopy  Please let us know if you have further even brief loss of consiousness as we would need to consider other testing and cardiology referral  Please go to the LAB in the Basement (turn left off the elevator) for the tests to be done today  You will be contacted by phone if any changes need to be made immediately.  Otherwise, you will receive a letter about your results with an explanation, but please check with MyChart first.  Please remember to sign up for MyChart if you have not done so, as this will be important to you in the future with finding out test results, communicating by private email, and scheduling acute appointments online when needed.  Please return in 6 months, or sooner if needed

## 2018-12-22 ENCOUNTER — Encounter: Payer: Self-pay | Admitting: Internal Medicine

## 2018-12-22 DIAGNOSIS — M25511 Pain in right shoulder: Secondary | ICD-10-CM | POA: Insufficient documentation

## 2018-12-22 NOTE — Assessment & Plan Note (Signed)
stable overall by history and exam, recent data reviewed with pt, and pt to continue medical treatment as before,  to f/u any worsening symptoms or concerns, for a1c with labs 

## 2018-12-22 NOTE — Assessment & Plan Note (Signed)
[place] for volt gel prn,  to f/u any worsening symptoms or concerns

## 2018-12-22 NOTE — Assessment & Plan Note (Signed)
Mild to mod, for celexa 10 qd, declines referral counseling or psychiatry

## 2018-12-22 NOTE — Assessment & Plan Note (Signed)
stable overall by history and exam, recent data reviewed with pt, and pt to continue medical treatment as before,  to f/u any worsening symptoms or concerns  

## 2018-12-22 NOTE — Assessment & Plan Note (Addendum)
Recurrent, etiology unclear, ecg reviewed, pt agrees to echo, declines other such as MRI, carotids, cardio neuro referrals for now  In addition to the time spent performing CPE, I spent an additional 40 minutes face to face,in which greater than 50% of this time was spent in counseling and coordination of care for patient's acute illness as documented, including the differential dx, treatment, further evaluation and other management of syncope, right shoulder pain, anxiety and depression, HTN, hyperglycemia, Vit D deficiency, Osteoarthritis

## 2018-12-22 NOTE — Assessment & Plan Note (Signed)
Etiology unclear, possible rot cuff dz, for sport med referral

## 2018-12-22 NOTE — Assessment & Plan Note (Signed)
For f/u vit d level, may need further replacement

## 2018-12-22 NOTE — Assessment & Plan Note (Signed)

## 2018-12-25 ENCOUNTER — Other Ambulatory Visit: Payer: Self-pay | Admitting: Internal Medicine

## 2018-12-25 DIAGNOSIS — F32A Depression, unspecified: Secondary | ICD-10-CM

## 2018-12-25 DIAGNOSIS — F329 Major depressive disorder, single episode, unspecified: Secondary | ICD-10-CM

## 2018-12-27 ENCOUNTER — Other Ambulatory Visit: Payer: Self-pay

## 2018-12-27 ENCOUNTER — Ambulatory Visit (HOSPITAL_COMMUNITY): Payer: PRIVATE HEALTH INSURANCE | Attending: Cardiovascular Disease

## 2018-12-27 ENCOUNTER — Encounter: Payer: Self-pay | Admitting: Internal Medicine

## 2018-12-27 DIAGNOSIS — R55 Syncope and collapse: Secondary | ICD-10-CM | POA: Insufficient documentation

## 2019-01-02 ENCOUNTER — Telehealth: Payer: Self-pay | Admitting: *Deleted

## 2019-01-02 NOTE — Telephone Encounter (Signed)

## 2019-01-03 NOTE — Progress Notes (Signed)
Virtual Visit via Video Note   This visit type was conducted due to national recommendations for restrictions regarding the COVID-19 Pandemic (e.g. social distancing) in an effort to limit this patient's exposure and mitigate transmission in our community.  Due to her co-morbid illnesses, this patient is at least at moderate risk for complications without adequate follow up.  This format is felt to be most appropriate for this patient at this time.  All issues noted in this document were discussed and addressed.  A limited physical exam was performed with this format.  Please refer to the patient's chart for her consent to telehealth for [facility].   Date:  01/04/2019   ID:  Jennifer Waters, DOB March 12, 1961, MRN [id number]  Patient Location: Home Provider Location: Office  PCP:  Biagio Borg, MD  Cardiologist: Dr. Ellyn Hack  Electrophysiologist:  None   Evaluation Performed:  Follow-Up Visit  Chief Complaint:  Hypertension   History of Present Illness:    Jennifer Waters is a 58 y.o. female we are following for ongoing assessment and management of hypertension, PPM in situ, with other hx of GERD, Obesity. and ongoing tobacco abuse. Sleep study was recommended.   Jennifer Waters is doing very well today.  She was recently seen her primary care physician [name] 2 weeks ago.  She denies any chest pain dizziness shortness of breath or fatigue.  She is currently unemployed and looking for an online job.  She is medically compliant.  She does have some issues paying for Bystolic as it is the most expensive of her medications.  She was given some samples by [name] but is running out.   She is being safe in the middle of the pandemic.  Her daughter works at [employer] and brings home her medications, food, and other supplies.  She tries to remain active.  She has not had a sleep study yet.  This is to be planned via her primary care.  The patient does not have symptoms concerning for  COVID-19 infection (fever, chills, cough, or new shortness of breath).    Past Medical History:  Diagnosis Date  . Arthritis   . Hypertension   . Migraines    Past Surgical History:  Procedure Laterality Date  . ABDOMINAL HYSTERECTOMY       Current Meds  Medication Sig  . albuterol (PROVENTIL HFA;VENTOLIN HFA) 108 (90 Base) MCG/ACT inhaler Inhale 2 puffs into the lungs every 6 (six) hours as needed for wheezing or shortness of breath.  Marland Kitchen amLODipine (NORVASC) 10 MG tablet Take 1 tablet (10 mg total) by mouth daily.  . cetirizine (ZYRTEC) 10 MG tablet Take 1 tablet (10 mg total) by mouth daily.  . citalopram (CELEXA) 10 MG tablet Take 1 tablet (10 mg total) by mouth daily.  . diclofenac (VOLTAREN) 75 MG EC tablet Take 1 tablet (75 mg total) by mouth 2 (two) times daily as needed.  . diclofenac sodium (VOLTAREN) 1 % GEL Apply 2 g topically 4 (four) times daily.  . fluticasone (FLONASE) 50 MCG/ACT nasal spray Place 2 sprays into both nostrils daily.  Marland Kitchen gabapentin (NEURONTIN) 300 MG capsule Take 1 capsule (300 mg total) by mouth 3 (three) times daily.  . hydrOXYzine (ATARAX/VISTARIL) 10 MG tablet Take 1 tablet (10 mg total) by mouth 3 (three) times daily as needed.  . Nebivolol HCl (BYSTOLIC) 20 MG TABS Take 1 tablet (20 mg total) by mouth daily.  Marland Kitchen omeprazole (PRILOSEC) 20 MG capsule TAKE 1  CAPSULE BY MOUTH EVERY DAY  . valACYclovir (VALTREX) 500 MG tablet Take 1 tablet (500 mg total) by mouth 2 (two) times daily.  Marland Kitchen venlafaxine XR (EFFEXOR-XR) 75 MG 24 hr capsule TAKE 1 CAPSULE (75 MG TOTAL) BY MOUTH DAILY WITH BREAKFAST. NEEDS VISIT FOR FUTURE REFILLS.  Marland Kitchen Vitamin D, Ergocalciferol, (DRISDOL) 1.25 MG (50000 UT) CAPS capsule Take 1 capsule (50,000 Units total) by mouth every 7 (seven) days.     Allergies:   Chantix [varenicline]   Social History   Tobacco Use  . Smoking status: Current Every Day Smoker    Packs/day: 0.50    Years: 20.00    Pack years: 10.00    Types: Cigarettes   . Smokeless tobacco: Never Used  Substance Use Topics  . Alcohol use: Yes    Alcohol/week: 5.0 standard drinks    Types: 5 Glasses of wine per week  . Drug use: No     Family Hx: The patient's family history includes Arrhythmia in her sister; Arthritis in her mother and sister; Brain cancer in her brother; Diabetes in her brother; Edema in her sister; Lung cancer in her father; Migraines in her mother; Ovarian cancer in her mother.  ROS:   Please see the history of present illness.    All other systems reviewed and are negative.   Prior CV studies:   The following studies were reviewed today:  Echocardiogram 12/25/2018  1. The left ventricle has normal systolic function, with an ejection fraction of 55-60%. The cavity size was normal. Left ventricular diastolic Doppler parameters are consistent with impaired relaxation. Indeterminate filling pressures.  2. The right ventricle has normal systolic function. The cavity was normal. There is no increase in right ventricular wall thickness. Right ventricular systolic pressure could not be assessed.  3. The aortic root is normal in size and structure.  Labs/Other Tests and Data Reviewed:    EKG:    Recent Labs: 03/27/2018: Magnesium 2.2 12/20/2018: ALT 18; BUN 15; Creatinine, Ser 1.06; Hemoglobin 14.5; Platelets 283.0; Potassium 4.1; Sodium 142; TSH 0.93   Recent Lipid Panel Lab Results  Component Value Date/Time   CHOL 176 12/20/2018 10:43 AM   TRIG 154.0 (H) 12/20/2018 10:43 AM   HDL 42.60 12/20/2018 10:43 AM   CHOLHDL 4 12/20/2018 10:43 AM   LDLCALC 103 (H) 12/20/2018 10:43 AM    Wt Readings from Last 3 Encounters:  01/04/19 239 lb (108.4 kg)  12/20/18 239 lb (108.4 kg)  08/18/18 252 lb (114.3 kg)     Objective:    Vital Signs:  Ht 5\' 9"  (1.753 m)   Wt 239 lb (108.4 kg)   BMI 35.29 kg/m  (Patient does not have ability to take her BP). Last BP recorded at PCP office 2 weeks ago.  VITAL SIGNS:  reviewed GEN:  no acute  distress EYES:  sclerae anicteric, EOMI - Extraocular Movements Intact MUSCULOSKELETAL:  no obvious deformities. NEURO:  alert and oriented x 3, no obvious focal deficit PSYCH:  normal affect  ASSESSMENT & PLAN:    1.  Hypertension: Blood pressure appears to be controlled according to her prior visit 2 weeks ago with PCP.  She is medically compliant and has no side effects from her medications.  She is having some trouble affording by systolic and has been getting samples from her PCP and from our office.  She is requesting samples if they are available.  I will check with our office to see we have any.  Otherwise the patient  is doing well.  Echocardiogram is been discussed with her.  She verbalizes understanding.  No changes in her medications at this time.  2.  Questionable OSA: Follow-up sleep study when available should be completed.  3.  Obesity: Increase exercise and reduce calories.  She is not getting out very much in the middle of the pandemic but I have encouraged her to be as active as possible and safe environments.  She is to avoid excessive heat.  She verbalizes understanding.  4. PPM in situ: Interrogations per protocol   COVID-19 Education: The signs and symptoms of COVID-19 were discussed with the patient and how to seek care for testing (follow up with PCP or arrange E-visit).  The importance of social distancing was discussed today.  Time:   Today, I have spent 15 minutes with the patient with telehealth technology discussing the above problems.     Medication Adjustments/Labs and Tests Ordered: Current medicines are reviewed at length with the patient today.  Concerns regarding medicines are outlined above.   Tests Ordered: No orders of the defined types were placed in this encounter.   Medication Changes: No orders of the defined types were placed in this encounter.   Disposition:  Follow up 6 months  Signed, Phill Myron. [place] Pugh, ANP, AACC  01/04/2019  8:10 AM    [place]

## 2019-01-04 ENCOUNTER — Encounter: Payer: Self-pay | Admitting: Adult Health

## 2019-01-04 ENCOUNTER — Telehealth (INDEPENDENT_AMBULATORY_CARE_PROVIDER_SITE_OTHER): Payer: PRIVATE HEALTH INSURANCE | Admitting: Adult Health

## 2019-01-04 VITALS — Ht 69.0 in | Wt 239.0 lb

## 2019-01-04 DIAGNOSIS — Z720 Tobacco use: Secondary | ICD-10-CM

## 2019-01-04 DIAGNOSIS — I10 Essential (primary) hypertension: Secondary | ICD-10-CM

## 2019-01-04 DIAGNOSIS — E669 Obesity, unspecified: Secondary | ICD-10-CM

## 2019-01-04 NOTE — Patient Instructions (Signed)
Medication Instructions:  Continue current medications  If you need a refill on your cardiac medications before your next appointment, please call your pharmacy.  Labwork: None Ordered   Testing/Procedures: None Ordered  Follow-Up: You will need a follow up appointment in 6 months.  Please call our office 2 months in advance to schedule this appointment.  You may see Dr Ellyn Hack or one of the following Advanced Practice Providers on your designated Care Team:   Rosaria Ferries, PA-C . Jory Sims, DNP, ANP     At [facility], you and your health needs are our priority.  As part of our continuing mission to provide you with exceptional heart care, we have created designated Provider Care Teams.  These Care Teams include your primary Cardiologist (physician) and Advanced Practice Providers (APPs -  Physician Assistants and Nurse Practitioners) who all work together to provide you with the care you need, when you need it.  Thank you for choosing [facility]!!

## 2019-01-09 ENCOUNTER — Encounter: Payer: Self-pay | Admitting: Gastroenterology

## 2019-01-17 ENCOUNTER — Other Ambulatory Visit: Payer: Self-pay

## 2019-01-17 ENCOUNTER — Ambulatory Visit (AMBULATORY_SURGERY_CENTER): Payer: Self-pay | Admitting: *Deleted

## 2019-01-17 VITALS — Ht 69.0 in | Wt 246.0 lb

## 2019-01-17 DIAGNOSIS — Z1211 Encounter for screening for malignant neoplasm of colon: Secondary | ICD-10-CM

## 2019-01-17 MED ORDER — NA SULFATE-K SULFATE-MG SULF 17.5-3.13-1.6 GM/177ML PO SOLN: ORAL | 0 refills | Status: DC

## 2019-01-17 NOTE — Progress Notes (Signed)
Patient is here for PV. Patient denies any allergies to eggs or soy. Patient denies any problems with anesthesia/sedation. Patient denies any oxygen use at home. Patient denies taking any diet/weight loss medications or blood thinners. EMMI education assisgned to patient on colonoscopy, this was explained and instructions given to patient. Suprep $15 off coupon given to pt. Pt was instructed not to smoke marijuana prep day or day of exam.  Pt is aware that care partner will wait in the car during procedure; if they feel like they will be too hot to wait in the car; they may wait in the lobby.  We want them to wear a mask (we do not have any that we can provide them), practice social distancing, and we will check their temperatures when they get here.  I did remind patient that their care partner needs to stay in the parking lot the entire time. Pt will wear mask into building.

## 2019-01-20 HISTORY — PX: COLONOSCOPY: SHX174

## 2019-01-20 NOTE — Progress Notes (Signed)
[facility] [address], [place] [address] Phone: [phone] Subjective:   [name], am serving as a scribe for Dr. Hulan Saas.  I'm seeing this patient by the request  of:    CC: Neck pain follow-up  [id number]   02/2018 Significant arthritic changes of the neck.  Worsening symptoms at this time with a positive Spurling's.  Discussed with patient about icing regimen and home exercise.  Discussed which activities to doing which was to avoid.  Patient is to increase activity as tolerated.  Following up again in 4 to 6 weeks after the injection.  And then will consider starting manipulation again muscle relaxer also prescribed today warned of potential side effects\  Update 01/22/2019 Jennifer Waters is a 58 y.o. female coming in with complaint of right shoulder pain. Patient states that she has been having pain for 1 month with tingling 1 week ago.  Patient states that it seems to be worsening and can be bilateral.  Patient has had this pain previously.  In reviewing patient's chart patient did do well with an epidural a year ago.  This was cervical in nature.  When discussed with patient she does seem that it seems somewhat similar but does not remember going down both arms.  Patient has pain with lying on shoulder and when not lying on it. Also notes swelling of the right knee with pain while sleeping.   Also having more of the right sided knee pain.  Has had swelling.  Intermittent swelling over the years and has had 1 injection which did help significantly.  Mild increasing instability of the knee.  Affecting daily activities and waking her up at night.  Past Medical History:  Diagnosis Date  . Arthritis   . Depression   . Hypertension   . Migraines    Past Surgical History:  Procedure Laterality Date  . ABDOMINAL HYSTERECTOMY    . BREAST MASS EXCISION     benign done as a child   Social History   Socioeconomic History  .  Marital status: Divorced    Spouse name: Not on file  . Number of children: 1  . Years of education: 33  . Highest education level: Not on file  Occupational History  . Occupation: Therapist, music  . Financial resource strain: Not on file  . Food insecurity    Worry: Not on file    Inability: Not on file  . Transportation needs    Medical: Not on file    Non-medical: Not on file  Tobacco Use  . Smoking status: Current Every Day Smoker    Packs/day: 0.50    Years: 20.00    Pack years: 10.00    Types: Cigarettes  . Smokeless tobacco: Never Used  Substance and Sexual Activity  . Alcohol use: Yes    Alcohol/week: 5.0 standard drinks    Types: 5 Glasses of wine per week  . Drug use: Yes    Frequency: 1.0 times per week    Types: Marijuana  . Sexual activity: Yes    Birth control/protection: Surgical  Lifestyle  . Physical activity    Days per week: Not on file    Minutes per session: Not on file  . Stress: Not on file  Relationships  . Social Herbalist on phone: Not on file    Gets together: Not on file    Attends religious service: Not on file  Active member of club or organization: Not on file    Attends meetings of clubs or organizations: Not on file    Relationship status: Not on file  Other Topics Concern  . Not on file  Social History Narrative   Fun: Puzzles, cats   Denies any religious beliefs effecting health care. Lives with daughter in a one story home.  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  Education: some college.   Allergies  Allergen Reactions  . Chantix [Varenicline] Nausea Only   Family History  Problem Relation Age of Onset  . Arthritis Mother   . Ovarian cancer Mother   . Migraines Mother   . Lung cancer Father   . Arrhythmia Sister   . Brain cancer Brother   . Edema Sister   . Arthritis Sister   . Diabetes Brother   . Colon polyps Neg Hx   . Colon cancer Neg Hx   . Esophageal cancer Neg Hx   . Rectal cancer Neg Hx    . Stomach cancer Neg Hx      Current Outpatient Medications (Cardiovascular):  .  amLODipine (NORVASC) 10 MG tablet, Take 1 tablet (10 mg total) by mouth daily. .  Nebivolol HCl (BYSTOLIC) 20 MG TABS, Take 1 tablet (20 mg total) by mouth daily.  Current Outpatient Medications (Respiratory):  .  albuterol (PROVENTIL HFA;VENTOLIN HFA) 108 (90 Base) MCG/ACT inhaler, Inhale 2 puffs into the lungs every 6 (six) hours as needed for wheezing or shortness of breath. .  cetirizine (ZYRTEC) 10 MG tablet, Take 1 tablet (10 mg total) by mouth daily. .  fluticasone (FLONASE) 50 MCG/ACT nasal spray, Place 2 sprays into both nostrils daily.  Current Outpatient Medications (Analgesics):  .  diclofenac (VOLTAREN) 75 MG EC tablet, Take 1 tablet (75 mg total) by mouth 2 (two) times daily as needed.   Current Outpatient Medications (Other):  .  citalopram (CELEXA) 10 MG tablet, Take 1 tablet (10 mg total) by mouth daily. .  diclofenac sodium (VOLTAREN) 1 % GEL, Apply 2 g topically 4 (four) times daily. Marland Kitchen  gabapentin (NEURONTIN) 300 MG capsule, Take 1 capsule (300 mg total) by mouth 3 (three) times daily. .  hydrOXYzine (ATARAX/VISTARIL) 10 MG tablet, Take 1 tablet (10 mg total) by mouth 3 (three) times daily as needed. .  Na Sulfate-K Sulfate-Mg Sulf 17.5-3.13-1.6 GM/177ML SOLN, Suprep (no substitutions)-TAKE AS DIRECTED. Marland Kitchen  omeprazole (PRILOSEC) 20 MG capsule, TAKE 1 CAPSULE BY MOUTH EVERY DAY .  valACYclovir (VALTREX) 500 MG tablet, Take 1 tablet (500 mg total) by mouth 2 (two) times daily. Marland Kitchen  venlafaxine XR (EFFEXOR-XR) 75 MG 24 hr capsule, TAKE 1 CAPSULE (75 MG TOTAL) BY MOUTH DAILY WITH BREAKFAST. NEEDS VISIT FOR FUTURE REFILLS. Marland Kitchen  Vitamin D, Ergocalciferol, (DRISDOL) 1.25 MG (50000 UT) CAPS capsule, Take 1 capsule (50,000 Units total) by mouth every 7 (seven) days. Marland Kitchen  gabapentin (NEURONTIN) 100 MG capsule, Take 2 capsules (200 mg total) by mouth at bedtime.    Past medical history, social, surgical  and family history all reviewed in electronic medical record.  No pertanent information unless stated regarding to the chief complaint.   Review of Systems:  No headache, visual changes, nausea, vomiting, diarrhea, constipation, dizziness, abdominal pain, skin rash, fevers, chills, night sweats, weight loss, swollen lymph nodes, body aches, joint swelling,, chest pain, shortness of breath, mood changes.  Positive muscle aches  Objective  Blood pressure 122/72, pulse 81, height 5\' 9"  (1.753 m), weight 245 lb (111.1 kg),  SpO2 97 %.   General: No apparent distress alert and oriented x3 mood and affect normal, dressed appropriately.  HEENT: Pupils equal, extraocular movements intact  Respiratory: Patient's speak in full sentences and does not appear short of breath  Cardiovascular: No lower extremity edema, non tender, no erythema  Skin: Warm dry intact with no signs of infection or rash on extremities or on axial skeleton.  Abdomen: Soft nontender  Neuro: Cranial nerves II through XII are intact, neurovascularly intact in all extremities with 2+ DTRs and 2+ pulses.  Lymph: No lymphadenopathy of posterior or anterior cervical chain or axillae bilaterally.  Gait normal with good balance and coordination.  MSK:  Non tender with full range of motion and good stability and symmetric strength and tone of shoulders, elbows, wrist, hip, and ankles bilaterally.  Neck: Inspection loss of lordosis. No palpable stepoffs. Positive Spurling's maneuver. Limited range of motion in all planes of 5 to 10 degrees Grip strength and sensation normal in bilateral hands Strength good C4 to T1 distribution No sensory change to C4 to T1 Negative Hoffman sign bilaterally Reflexes normal Tightness of the trapezius bilaterally  Patient's right knee does have some crepitus noted.  Translocation of the kneecap.  Trace effusion noted.  Lacks last 10 degrees of flexion in the last 5 degrees of extension.  No significant  though instability noted.  Limited musculoskeletal ultrasound was performed and interpreted by Lyndal Pulley  Limited ultrasound of patient's knee shows moderate arthritic changes with joint effusion of the patellofemoral joint and moderate narrowing of the medial joint space with a degenerative meniscal tear Impression: Moderate arthritis with trace effusion  After informed written and verbal consent, patient was seated on exam table. Right knee was prepped with alcohol swab and utilizing anterolateral approach, patient's right knee space was injected with 4:1  marcaine 0.5%: Kenalog 40mg /dL. Patient tolerated the procedure well without immediate complications.   Impression and Recommendations:     This case required medical decision making of moderate complexity. The above documentation has been reviewed and is accurate and complete Lyndal Pulley, DO       Note: This dictation was prepared with Dragon dictation along with smaller phrase technology. Any transcriptional errors that result from this process are unintentional.

## 2019-01-22 ENCOUNTER — Encounter: Payer: Self-pay | Admitting: Family Medicine

## 2019-01-22 ENCOUNTER — Ambulatory Visit (INDEPENDENT_AMBULATORY_CARE_PROVIDER_SITE_OTHER)
Admission: RE | Admit: 2019-01-22 | Discharge: 2019-01-22 | Disposition: A | Discharge status: Home / Self Care | Payer: PRIVATE HEALTH INSURANCE | Source: Ambulatory Visit | Attending: Family Medicine | Admitting: Family Medicine

## 2019-01-22 ENCOUNTER — Other Ambulatory Visit: Payer: Self-pay

## 2019-01-22 ENCOUNTER — Ambulatory Visit (INDEPENDENT_AMBULATORY_CARE_PROVIDER_SITE_OTHER): Payer: PRIVATE HEALTH INSURANCE | Admitting: Family Medicine

## 2019-01-22 ENCOUNTER — Ambulatory Visit: Payer: Self-pay

## 2019-01-22 VITALS — BP 122/72 | HR 81 | Ht 69.0 in | Wt 245.0 lb

## 2019-01-22 DIAGNOSIS — M25511 Pain in right shoulder: Secondary | ICD-10-CM

## 2019-01-22 DIAGNOSIS — M5412 Radiculopathy, cervical region: Secondary | ICD-10-CM

## 2019-01-22 DIAGNOSIS — M5010 Cervical disc disorder with radiculopathy, unspecified cervical region: Secondary | ICD-10-CM | POA: Diagnosis not present

## 2019-01-22 DIAGNOSIS — G8929 Other chronic pain: Secondary | ICD-10-CM

## 2019-01-22 DIAGNOSIS — M25569 Pain in unspecified knee: Secondary | ICD-10-CM | POA: Diagnosis not present

## 2019-01-22 DIAGNOSIS — M1711 Unilateral primary osteoarthritis, right knee: Secondary | ICD-10-CM

## 2019-01-22 MED ORDER — GABAPENTIN 100 MG PO CAPS: 200.0000 mg | ORAL_CAPSULE | Freq: Every day | ORAL | 0 refills | Status: DC

## 2019-01-22 NOTE — Assessment & Plan Note (Signed)
Likely secondary to more of the degenerative disc disease and the radicular symptoms again.  Ordered patient's epidural.  Patient will continue the same medications otherwise.  We discussed gabapentin and started a low dose and we will see how patient responds.  Patient will continue on the Effexor.  Follow-up again 2 to 3 weeks after the epidural

## 2019-01-22 NOTE — Assessment & Plan Note (Signed)
Patient given injection today and tolerated the procedure well.  Discussed icing regimen and home exercise.  Discussed which activities to do which wants to avoid.  Increase activity as tolerated.  Follow-up again in 4 to 8 weeks could be a candidate for Visco supplementation

## 2019-01-22 NOTE — Patient Instructions (Signed)
[facility], [place], [address] Xray downstairs [facility] [phone] Gabapentin at night

## 2019-01-30 ENCOUNTER — Telehealth: Payer: Self-pay | Admitting: Gastroenterology

## 2019-01-30 NOTE — Telephone Encounter (Signed)
Left message on vmail regarding Covid-19 screening questions. °Covid-19 Screening Questions: °  °Do you now or have you had a fever in the last 14 days?  °  °Do you have any respiratory symptoms of shortness of breath or cough now or in the last 14 days?  °  °Do you have any family members or close contacts with diagnosed  °or suspected Covid-19 in the past 14 days?  °  °Have you been tested for Covid-19 and found to be positive?  °  ° °

## 2019-01-31 ENCOUNTER — Ambulatory Visit (AMBULATORY_SURGERY_CENTER): Payer: PRIVATE HEALTH INSURANCE | Admitting: Gastroenterology

## 2019-01-31 ENCOUNTER — Encounter: Payer: Self-pay | Admitting: Gastroenterology

## 2019-01-31 ENCOUNTER — Other Ambulatory Visit: Payer: Self-pay

## 2019-01-31 VITALS — BP 111/70 | HR 73 | Temp 98.6°F | Resp 12 | Ht 69.0 in | Wt 246.0 lb

## 2019-01-31 DIAGNOSIS — D124 Benign neoplasm of descending colon: Secondary | ICD-10-CM | POA: Diagnosis not present

## 2019-01-31 DIAGNOSIS — Z1211 Encounter for screening for malignant neoplasm of colon: Secondary | ICD-10-CM

## 2019-01-31 DIAGNOSIS — D122 Benign neoplasm of ascending colon: Secondary | ICD-10-CM | POA: Diagnosis not present

## 2019-01-31 MED ORDER — SODIUM CHLORIDE 0.9 % IV SOLN: 500.0000 mL | INTRAVENOUS | Status: DC

## 2019-01-31 NOTE — Progress Notes (Signed)
PT taken to PACU. Monitors in place. VSS. Report given to RN. 

## 2019-01-31 NOTE — Progress Notes (Signed)
Pt's states no medical or surgical changes since previsit or office visit.   Cw vitals and JB temps.

## 2019-01-31 NOTE — Progress Notes (Signed)
Called to room to assist during endoscopic procedure.  Patient ID and intended procedure confirmed with present staff. Received instructions for my participation in the procedure from the performing physician.  

## 2019-01-31 NOTE — Op Note (Signed)
[facility] Patient Name: Jennifer Waters Procedure Date: 01/31/2019 10:18 AM MRN: [id number] Endoscopist: Thornton Park MD, MD Age: 58 Referring MD:  Date of Birth: 08-24-1960 Gender: Female Account #: [id number] Procedure:                Colonoscopy Indications:              Screening for colorectal malignant neoplasm, This                            is the patient's first colonoscopy                           No known family history of colon cancer or polyps                           No baseline GI symptoms. Medicines:                See the Anesthesia note for documentation of the                            administered medications Procedure:                Pre-Anesthesia Assessment:                           - Prior to the procedure, a History and Physical                            was performed, and patient medications and                            allergies were reviewed. The patient's tolerance of                            previous anesthesia was also reviewed. The risks                            and benefits of the procedure and the sedation                            options and risks were discussed with the patient.                            All questions were answered, and informed consent                            was obtained. Prior Anticoagulants: The patient has                            taken no previous anticoagulant or antiplatelet                            agents. ASA Grade Assessment: II - A patient with  mild systemic disease. After reviewing the risks                            and benefits, the patient was deemed in                            satisfactory condition to undergo the procedure.                           After obtaining informed consent, the colonoscope                            was passed under direct vision. Throughout the                            procedure, the patient's blood pressure, pulse,  and                            oxygen saturations were monitored continuously. The                            Colonoscope was introduced through the anus and                            advanced to the the terminal ileum, with                            identification of the appendiceal orifice and IC                            valve. A second forward view was performed of the                            right colon. The colonoscopy was performed without                            difficulty. The patient tolerated the procedure                            well. The quality of the bowel preparation was                            good. The terminal ileum, ileocecal valve,                            appendiceal orifice, and rectum were photographed. Scope In: 10:33:20 AM Scope Out: 10:55:42 AM Scope Withdrawal Time: 0 hours 19 minutes 52 seconds  Total Procedure Duration: 0 hours 22 minutes 22 seconds  Findings:                 The perianal and digital rectal examinations were                            normal.  Four sessile polyps were found in the ascending                            colon. The polyps were 2 to 3 mm in size. These                            polyps were removed with a cold snare. Resection                            and retrieval were complete. Estimated blood loss                            was minimal.                           Two polyps were found in the ascending colon. The                            polyps were 8 to 9 mm in size. These polyps were                            removed with a hot snare because the stiff cold                            snare would not successfully remove the polyp.                            Resection and retrieval were complete. Estimated                            blood loss was minimal.                           Three sessile polyps were found in the descending                            colon. The polyps were 2  to 4 mm in size. These                            polyps were removed with a cold snare. Resection                            and retrieval were complete. Estimated blood loss                            was minimal.                           Small internal hemorrhoids were seen on retroflexed                            views of the rectum. The exam was otherwise without  abnormality on direct and retroflexion views. Complications:            No immediate complications. Estimated blood loss:                            Minimal. Estimated Blood Loss:     Estimated blood loss was minimal. Impression:               - Four 2 to 3 mm polyps in the ascending colon,                            removed with a cold snare. Resected and retrieved.                           - Two 8 to 9 mm polyps in the ascending colon,                            removed with a hot snare. Resected and retrieved.                           - Three 2 to 4 mm polyps in the descending colon,                            removed with a cold snare. Resected and retrieved.                           - The examination was otherwise normal on direct                            and retroflexion views. Recommendation:           - Patient has a contact number available for                            emergencies. The signs and symptoms of potential                            delayed complications were discussed with the                            patient. Return to normal activities tomorrow.                            Written discharge instructions were provided to the                            patient.                           - Resume previous diet today.                           - Continue present medications.                           -  Await pathology results.                           - Repeat colonoscopy date to be determined after                            pending pathology results are reviewed for                             surveillance.                           - Your siblings and children should start colon                            cancer screening at age 68 given the number of                            polyps removed today. Thornton Park MD, MD 01/31/2019 11:03:28 AM This report has been signed electronically.

## 2019-01-31 NOTE — Patient Instructions (Signed)
Please read handouts provided. Continue present medications. Await pathology results.        YOU HAD AN ENDOSCOPIC PROCEDURE TODAY AT THE [facility]:   Refer to the procedure report that was given to you for any specific questions about what was found during the examination.  If the procedure report does not answer your questions, please call your gastroenterologist to clarify.  If you requested that your care partner not be given the details of your procedure findings, then the procedure report has been included in a sealed envelope for you to review at your convenience later.  YOU SHOULD EXPECT: Some feelings of bloating in the abdomen. Passage of more gas than usual.  Walking can help get rid of the air that was put into your GI tract during the procedure and reduce the bloating. If you had a lower endoscopy (such as a colonoscopy or flexible sigmoidoscopy) you may notice spotting of blood in your stool or on the toilet paper. If you underwent a bowel prep for your procedure, you may not have a normal bowel movement for a few days.  Please Note:  You might notice some irritation and congestion in your nose or some drainage.  This is from the oxygen used during your procedure.  There is no need for concern and it should clear up in a day or so.  SYMPTOMS TO REPORT IMMEDIATELY:   Following lower endoscopy (colonoscopy or flexible sigmoidoscopy):  Excessive amounts of blood in the stool  Significant tenderness or worsening of abdominal pains  Swelling of the abdomen that is new, acute  Fever of 100F or higher    For urgent or emergent issues, a gastroenterologist can be reached at any hour by calling [phone].   DIET:  We do recommend a small meal at first, but then you may proceed to your regular diet.  Drink plenty of fluids but you should avoid alcoholic beverages for 24 hours.  ACTIVITY:  You should plan to take it easy for the rest of today and you should NOT  DRIVE or use heavy machinery until tomorrow (because of the sedation medicines used during the test).    FOLLOW UP: Our staff will call the number listed on your records 48-72 hours following your procedure to check on you and address any questions or concerns that you may have regarding the information given to you following your procedure. If we do not reach you, we will leave a message.  We will attempt to reach you two times.  During this call, we will ask if you have developed any symptoms of COVID 19. If you develop any symptoms (ie: fever, flu-like symptoms, shortness of breath, cough etc.) before then, please call [phone].  If you test positive for Covid 19 in the 2 weeks post procedure, please call and report this information to us.    If any biopsies were taken you will be contacted by phone or by letter within the next 1-3 weeks.  Please call us at [phone] if you have not heard about the biopsies in 3 weeks.    SIGNATURES/CONFIDENTIALITY: You and/or your care partner have signed paperwork which will be entered into your electronic medical record.  These signatures attest to the fact that that the information above on your After Visit Summary has been reviewed and is understood.  Full responsibility of the confidentiality of this discharge information lies with you and/or your care-partner. 

## 2019-02-02 ENCOUNTER — Telehealth: Payer: Self-pay | Admitting: *Deleted

## 2019-02-02 NOTE — Telephone Encounter (Signed)
1. Have you developed a fever since your procedure? no  2.   Have you had an respiratory symptoms (SOB or cough) since your procedure? no  3.   Have you tested positive for COVID 19 since your procedure no  4.   Have you had any family members/close contacts diagnosed with the COVID 19 since your procedure?  no   If yes to any of these questions please route to Joylene John, RN and Alphonsa Gin, Therapist, sports.  Follow up Call-  Call back number 01/31/2019  Post procedure Call Back phone  # [phone]  Permission to leave phone message Yes  Some recent data might be hidden     Patient questions:  Do you have a fever, pain , or abdominal swelling? No. Pain Score  0 *  Have you tolerated food without any problems? Yes.    Have you been able to return to your normal activities? Yes.    Do you have any questions about your discharge instructions: Diet   No. Medications  No. Follow up visit  No.  Do you have questions or concerns about your Care? No.  Actions: * If pain score is 4 or above: No action needed, pain <4.

## 2019-02-05 ENCOUNTER — Other Ambulatory Visit: Payer: Self-pay

## 2019-02-05 ENCOUNTER — Ambulatory Visit
Admission: RE | Admit: 2019-02-05 | Discharge: 2019-02-05 | Disposition: A | Discharge status: Home / Self Care | Payer: Self-pay | Source: Ambulatory Visit | Attending: Family Medicine | Admitting: Family Medicine

## 2019-02-05 DIAGNOSIS — M5412 Radiculopathy, cervical region: Secondary | ICD-10-CM

## 2019-02-05 MED ORDER — TRIAMCINOLONE ACETONIDE 40 MG/ML IJ SUSP (RADIOLOGY)
60.0000 mg | Freq: Once | INTRAMUSCULAR | Status: AC
  Administered 2019-02-05: 60 mg via EPIDURAL

## 2019-02-05 MED ORDER — IOPAMIDOL (ISOVUE-M 300) INJECTION 61%
1.0000 mL | Freq: Once | INTRAMUSCULAR | Status: AC
  Administered 2019-02-05: 1 mL via EPIDURAL

## 2019-02-05 NOTE — Discharge Instructions (Signed)

## 2019-02-07 ENCOUNTER — Encounter: Payer: Self-pay | Admitting: Gastroenterology

## 2019-02-20 NOTE — Progress Notes (Signed)
[facility] [place], [place] Phone: [phone] Subjective:   Jennifer Waters, am serving as a scribe for Dr. Hulan Saas.  I'm seeing this patient by the request  of:    CC: Right knee pain  [id number]   01/22/2019 Likely secondary to more of the degenerative disc disease and the radicular symptoms again.  Ordered patient's epidural.  Patient will continue the same medications otherwise.  We discussed gabapentin and started a low dose and we will see how patient responds.  Patient will continue on the Effexor.  Follow-up again 2 to 3 weeks after the epidural  Patient given injection today and tolerated the procedure well.  Discussed icing regimen and home exercise.  Discussed which activities to do which wants to avoid.  Increase activity as tolerated.  Follow-up again in 4 to 8 weeks could be a candidate for Visco supplementation  Update 02/21/2019 Jennifer Waters is a 58 y.o. female coming in with complaint of neck pain and knee pain. Had epidural on 02/05/2019. Pain has been reduced but is still there.   Right knee pain has subsided.  States overall seems to be doing relatively better.  80 to 90% better  Patient notes that she has been having muscle cramps for years. She has the cramps in multiple areas of her body.       Past Medical History:  Diagnosis Date   Arthritis    Depression    Hypertension    Migraines    Past Surgical History:  Procedure Laterality Date   ABDOMINAL HYSTERECTOMY     BREAST MASS EXCISION     benign done as a child   Social History   Socioeconomic History   Marital status: Divorced    Spouse name: Not on file   Number of children: 1   Years of education: 14   Highest education level: Not on file  Occupational History   Occupation: Nurse, adult strain: Not on file   Food insecurity    Worry: Not on file    Inability: Not on file    Transportation needs    Medical: Not on file    Non-medical: Not on file  Tobacco Use   Smoking status: Current Every Day Smoker    Packs/day: 0.50    Years: 20.00    Pack years: 10.00    Types: Cigarettes   Smokeless tobacco: Never Used  Substance and Sexual Activity   Alcohol use: Not Currently    Alcohol/week: 5.0 standard drinks    Types: 5 Glasses of wine per week   Drug use: Yes    Frequency: 1.0 times per week    Types: Marijuana   Sexual activity: Yes    Birth control/protection: Surgical  Lifestyle   Physical activity    Days per week: Not on file    Minutes per session: Not on file   Stress: Not on file  Relationships   Social connections    Talks on phone: Not on file    Gets together: Not on file    Attends religious service: Not on file    Active member of club or organization: Not on file    Attends meetings of clubs or organizations: Not on file    Relationship status: Not on file  Other Topics Concern   Not on file  Social History Narrative   Fun: Puzzles, cats   Denies any religious  beliefs effecting health care. Lives with daughter in a one story home.  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  Education: some college.   Allergies  Allergen Reactions   Chantix [Varenicline] Nausea Only   Family History  Problem Relation Age of Onset   Arthritis Mother    Ovarian cancer Mother    Migraines Mother    Lung cancer Father    Arrhythmia Sister    Brain cancer Brother    Edema Sister    Arthritis Sister    Diabetes Brother    Colon polyps Neg Hx    Colon cancer Neg Hx    Esophageal cancer Neg Hx    Rectal cancer Neg Hx    Stomach cancer Neg Hx      Current Outpatient Medications (Cardiovascular):    amLODipine (NORVASC) 10 MG tablet, Take 1 tablet (10 mg total) by mouth daily.   Nebivolol HCl (BYSTOLIC) 20 MG TABS, Take 1 tablet (20 mg total) by mouth daily.  Current Outpatient Medications (Respiratory):    albuterol  (PROVENTIL HFA;VENTOLIN HFA) 108 (90 Base) MCG/ACT inhaler, Inhale 2 puffs into the lungs every 6 (six) hours as needed for wheezing or shortness of breath.   cetirizine (ZYRTEC) 10 MG tablet, Take 1 tablet (10 mg total) by mouth daily.   fluticasone (FLONASE) 50 MCG/ACT nasal spray, Place 2 sprays into both nostrils daily.  Current Outpatient Medications (Analgesics):    diclofenac (VOLTAREN) 75 MG EC tablet, Take 1 tablet (75 mg total) by mouth 2 (two) times daily as needed.   Current Outpatient Medications (Other):    citalopram (CELEXA) 10 MG tablet, Take 1 tablet (10 mg total) by mouth daily.   diclofenac sodium (VOLTAREN) 1 % GEL, Apply 2 g topically 4 (four) times daily.   gabapentin (NEURONTIN) 100 MG capsule, Take 2 capsules (200 mg total) by mouth at bedtime.   gabapentin (NEURONTIN) 300 MG capsule, Take 1 capsule (300 mg total) by mouth 3 (three) times daily.   hydrOXYzine (ATARAX/VISTARIL) 10 MG tablet, Take 1 tablet (10 mg total) by mouth 3 (three) times daily as needed.   omeprazole (PRILOSEC) 20 MG capsule, TAKE 1 CAPSULE BY MOUTH EVERY DAY   valACYclovir (VALTREX) 500 MG tablet, Take 1 tablet (500 mg total) by mouth 2 (two) times daily.   venlafaxine XR (EFFEXOR-XR) 75 MG 24 hr capsule, TAKE 1 CAPSULE (75 MG TOTAL) BY MOUTH DAILY WITH BREAKFAST. NEEDS VISIT FOR FUTURE REFILLS.   Vitamin D, Ergocalciferol, (DRISDOL) 1.25 MG (50000 UT) CAPS capsule, Take 1 capsule (50,000 Units total) by mouth every 7 (seven) days.    Past medical history, social, surgical and family history all reviewed in electronic medical record.  Waters pertanent information unless stated regarding to the chief complaint.   Review of Systems:  Waters headache, visual changes, nausea, vomiting, diarrhea, constipation, dizziness, abdominal pain, skin rash, fevers, chills, night sweats, weight loss, swollen lymph nodes, body aches, joint swelling, chest pain, shortness of breath, mood changes.  Positive  muscle aches  Objective  Blood pressure 128/86, pulse 94, height 5\' 9"  (1.753 m), weight 249 lb (112.9 kg), SpO2 97 %.    General: Waters apparent distress alert and oriented x3 mood and affect normal, dressed appropriately.  HEENT: Pupils equal, extraocular movements intact  Respiratory: Patient's speak in full sentences and does not appear short of breath  Cardiovascular: Waters lower extremity edema, non tender, Waters erythema  Skin: Warm dry intact with Waters signs of infection or rash on extremities or  on axial skeleton.  Abdomen: Soft nontender  Neuro: Cranial nerves II through XII are intact, neurovascularly intact in all extremities with 2+ DTRs and 2+ pulses.  Lymph: Waters lymphadenopathy of posterior or anterior cervical chain or axillae bilaterally.  Gait normal with good balance and coordination.  MSK:  Non tender with full range of motion and good stability and symmetric strength and tone of shoulders, elbows, wrist, hip and ankles bilaterally.  Knee: Right valgus deformity noted.  Abnormal thigh to calf ratio.  Tender to palpation over medial and PF joint line.  Significantly improved from previous exam ROM full in flexion and extension and lower leg rotation. instability with valgus force.  painful patellar compression. Patellar glide with moderate crepitus. Patellar and quadriceps tendons unremarkable. Hamstring and quadriceps strength is normal.   Neck exam does have some loss of lordosis, mild tenderness to palpation in the paraspinal musculature.  Some mild scapular dyskinesis noted.      Impression and Recommendations:     This case required medical decision making of moderate complexity. The above documentation has been reviewed and is accurate and complete Lyndal Pulley, DO       Note: This dictation was prepared with Dragon dictation along with smaller phrase technology. Any transcriptional errors that result from this process are unintentional.

## 2019-02-21 ENCOUNTER — Encounter: Payer: Self-pay | Admitting: Family Medicine

## 2019-02-21 ENCOUNTER — Ambulatory Visit (INDEPENDENT_AMBULATORY_CARE_PROVIDER_SITE_OTHER): Payer: PRIVATE HEALTH INSURANCE | Admitting: Family Medicine

## 2019-02-21 VITALS — BP 128/86 | HR 94 | Ht 69.0 in | Wt 249.0 lb

## 2019-02-21 DIAGNOSIS — G2589 Other specified extrapyramidal and movement disorders: Secondary | ICD-10-CM | POA: Diagnosis not present

## 2019-02-21 DIAGNOSIS — M1711 Unilateral primary osteoarthritis, right knee: Secondary | ICD-10-CM | POA: Diagnosis not present

## 2019-02-21 DIAGNOSIS — M5010 Cervical disc disorder with radiculopathy, unspecified cervical region: Secondary | ICD-10-CM

## 2019-02-21 NOTE — Patient Instructions (Signed)
Good to see you  Ice 20 minutes 2 times daily. Usually after activity and before bed.\ Exercises 3 times a week.  Keep hands within peripheral vision  2 cups of water immediately when you wake up  Then add a 1/4 cup of gatorade to water during the day  Continue the vitamins See me again in 8 weeks if not better

## 2019-02-21 NOTE — Assessment & Plan Note (Signed)
Poor posture noted, we discussed scapular dyskinesis, discussed exercises, things to avoid, range of motion follow-up again in 4 to 6 weeks

## 2019-02-21 NOTE — Assessment & Plan Note (Signed)
Improvement at the moment.  No need for Visco supplementation at the moment.  Follow-up again 6 weeks

## 2019-03-07 ENCOUNTER — Other Ambulatory Visit: Payer: Self-pay

## 2019-03-07 ENCOUNTER — Encounter: Payer: Self-pay | Admitting: Internal Medicine

## 2019-03-07 ENCOUNTER — Ambulatory Visit (INDEPENDENT_AMBULATORY_CARE_PROVIDER_SITE_OTHER): Payer: PRIVATE HEALTH INSURANCE | Admitting: Internal Medicine

## 2019-03-07 DIAGNOSIS — Z23 Encounter for immunization: Secondary | ICD-10-CM

## 2019-03-07 DIAGNOSIS — R739 Hyperglycemia, unspecified: Secondary | ICD-10-CM | POA: Diagnosis not present

## 2019-03-07 DIAGNOSIS — I10 Essential (primary) hypertension: Secondary | ICD-10-CM | POA: Diagnosis not present

## 2019-03-07 DIAGNOSIS — R252 Cramp and spasm: Secondary | ICD-10-CM | POA: Diagnosis not present

## 2019-03-07 MED ORDER — TRAMADOL HCL 50 MG PO TABS: 50.0000 mg | ORAL_TABLET | Freq: Four times a day (QID) | ORAL | 0 refills | Status: DC | PRN

## 2019-03-07 MED ORDER — METOPROLOL SUCCINATE ER 50 MG PO TB24: 50.0000 mg | ORAL_TABLET | Freq: Every day | ORAL | 3 refills | Status: DC

## 2019-03-07 MED ORDER — CYCLOBENZAPRINE HCL 5 MG PO TABS: 5.0000 mg | ORAL_TABLET | Freq: Three times a day (TID) | ORAL | 2 refills | Status: DC | PRN

## 2019-03-07 NOTE — Progress Notes (Signed)
Subjective:    Patient ID: Jennifer Waters, female    DOB: [birth year], 58 y.o.   MRN: [id number]  HPI  Here with unusual years worth hx of various muscular cramping infrequently, mild, intermittent with nothing to make better or worse, but for some reason now much more increased in frequency and severity to bilat upper abd, mid back, left upper arm, right 5th finger and hand.  Eating bananas and water no help yesterday.  Did take a friends percocet which helped quite a bit.  Denies overexertion, diuretic use, statin use or other unusual activities.  Pt denies chest pain, increased sob or doe, wheezing, orthopnea, PND, increased LE swelling, palpitations, dizziness or syncope. Pt denies new neurological symptoms such as new headache, or facial or extremity weakness or numbness   Pt denies polydipsia, polyuria Past Medical History:  Diagnosis Date  . Arthritis   . Depression   . Hypertension   . Migraines    Past Surgical History:  Procedure Laterality Date  . ABDOMINAL HYSTERECTOMY    . BREAST MASS EXCISION     benign done as a child    reports that she has been smoking cigarettes. She has a 10.00 pack-year smoking history. She has never used smokeless tobacco. She reports previous alcohol use of about 5.0 standard drinks of alcohol per week. She reports current drug use. Frequency: 1.00 time per week. Drug: Marijuana. family history includes Arrhythmia in her sister; Arthritis in her mother and sister; Brain cancer in her brother; Diabetes in her brother; Edema in her sister; Lung cancer in her father; Migraines in her mother; Ovarian cancer in her mother. Allergies  Allergen Reactions  . Chantix [Varenicline] Nausea Only   Current Outpatient Medications on File Prior to Visit  Medication Sig Dispense Refill  . albuterol (PROVENTIL HFA;VENTOLIN HFA) 108 (90 Base) MCG/ACT inhaler Inhale 2 puffs into the lungs every 6 (six) hours as needed for wheezing or shortness of breath. 1 Inhaler 0   . amLODipine (NORVASC) 10 MG tablet Take 1 tablet (10 mg total) by mouth daily. 90 tablet 1  . cetirizine (ZYRTEC) 10 MG tablet Take 1 tablet (10 mg total) by mouth daily. 90 tablet 0  . citalopram (CELEXA) 10 MG tablet Take 1 tablet (10 mg total) by mouth daily. 90 tablet 3  . diclofenac (VOLTAREN) 75 MG EC tablet Take 1 tablet (75 mg total) by mouth 2 (two) times daily as needed. 60 tablet 5  . diclofenac sodium (VOLTAREN) 1 % GEL Apply 2 g topically 4 (four) times daily. 100 g 3  . fluticasone (FLONASE) 50 MCG/ACT nasal spray Place 2 sprays into both nostrils daily. 16 g 6  . gabapentin (NEURONTIN) 100 MG capsule Take 2 capsules (200 mg total) by mouth at bedtime. 180 capsule 0  . gabapentin (NEURONTIN) 300 MG capsule Take 1 capsule (300 mg total) by mouth 3 (three) times daily. 90 capsule 0  . hydrOXYzine (ATARAX/VISTARIL) 10 MG tablet Take 1 tablet (10 mg total) by mouth 3 (three) times daily as needed. 90 tablet 0  . omeprazole (PRILOSEC) 20 MG capsule TAKE 1 CAPSULE BY MOUTH EVERY DAY 90 capsule 0  . valACYclovir (VALTREX) 500 MG tablet Take 1 tablet (500 mg total) by mouth 2 (two) times daily. 6 tablet 3  . venlafaxine XR (EFFEXOR-XR) 75 MG 24 hr capsule TAKE 1 CAPSULE (75 MG TOTAL) BY MOUTH DAILY WITH BREAKFAST. NEEDS VISIT FOR FUTURE REFILLS. 30 capsule 1  . Vitamin D, Ergocalciferol, (DRISDOL) 1.25  MG (50000 UT) CAPS capsule Take 1 capsule (50,000 Units total) by mouth every 7 (seven) days. 12 capsule 0   No current facility-administered medications on file prior to visit.    Review of Systems  Constitutional: Negative for other unusual diaphoresis or sweats HENT: Negative for ear discharge or swelling Eyes: Negative for other worsening visual disturbances Respiratory: Negative for stridor or other swelling  Gastrointestinal: Negative for worsening distension or other blood Genitourinary: Negative for retention or other urinary change Musculoskeletal: Negative for other MSK pain  or swelling Skin: Negative for color change or other new lesions Neurological: Negative for worsening tremors and other numbness  Psychiatric/Behavioral: Negative for worsening agitation or other fatigue ALl other system neg per pt    Objective:   Physical Exam BP 118/76   Pulse 92   Temp 98.4 F (36.9 C) (Oral)   Ht 5\' 9"  (1.753 m)   Wt 249 lb (112.9 kg)   SpO2 95%   BMI 36.77 kg/m  VS noted,  Constitutional: Pt appears in NAD HENT: Head: NCAT.  Right Ear: External ear normal.  Left Ear: External ear normal.  Eyes: . Pupils are equal, round, and reactive to light. Conjunctivae and EOM are normal Nose: without d/c or deformity Neck: Neck supple. Gross normal ROM Cardiovascular: Normal rate and regular rhythm.   Pulmonary/Chest: Effort normal and breath sounds without rales or wheezing.  Abd:  Soft, NT, ND, + BS, no organomegaly Neurological: Pt is alert. At baseline orientation, motor grossly intact Skin: Skin is warm. No rashes, other new lesions, no LE edema Psychiatric: Pt behavior is normal without agitation  No other exam findings Lab Results  Component Value Date   WBC 12.2 (H) 12/20/2018   HGB 14.5 12/20/2018   HCT 43.7 12/20/2018   PLT 283.0 12/20/2018   GLUCOSE 101 (H) 12/20/2018   CHOL 176 12/20/2018   TRIG 154.0 (H) 12/20/2018   HDL 42.60 12/20/2018   LDLCALC 103 (H) 12/20/2018   ALT 18 12/20/2018   AST 19 12/20/2018   NA 142 12/20/2018   K 4.1 12/20/2018   CL 108 12/20/2018   CREATININE 1.06 12/20/2018   BUN 15 12/20/2018   CO2 26 12/20/2018   TSH 0.93 12/20/2018   HGBA1C 5.9 12/20/2018       Assessment & Plan:

## 2019-03-07 NOTE — Assessment & Plan Note (Signed)
stable overall by history and exam, recent data reviewed with pt, and pt to continue medical treatment as before,  to f/u any worsening symptoms or concerns  

## 2019-03-07 NOTE — Patient Instructions (Addendum)
Please take all new medication as prescribed - the pain medication, and muscle relaxer as needed  Ok to change the Bystolic to Toprol XL 50 mg per day (generic)  Please continue all other medications as before, and refills have been done if requested.  Please have the pharmacy call with any other refills you may need.  Please continue your efforts at being more active, low cholesterol diet, and weight control.  Please keep your appointments with your specialists as you may have planned

## 2019-03-07 NOTE — Assessment & Plan Note (Signed)
Etiology unclear, for tramadol prn limited use, and flexeril tid prn,  to f/u any worsening symptoms or concerns

## 2019-03-07 NOTE — Assessment & Plan Note (Signed)
bystolic too expensive, ok to change to toprol XL 50 qd

## 2019-04-11 ENCOUNTER — Ambulatory Visit: Payer: PRIVATE HEALTH INSURANCE | Admitting: Family Medicine

## 2019-06-27 ENCOUNTER — Ambulatory Visit: Payer: PRIVATE HEALTH INSURANCE | Admitting: Internal Medicine

## 2019-07-12 ENCOUNTER — Ambulatory Visit (INDEPENDENT_AMBULATORY_CARE_PROVIDER_SITE_OTHER): Payer: 59 | Admitting: Internal Medicine

## 2019-07-12 ENCOUNTER — Encounter: Payer: Self-pay | Admitting: Internal Medicine

## 2019-07-12 ENCOUNTER — Other Ambulatory Visit: Payer: Self-pay

## 2019-07-12 VITALS — BP 138/76 | HR 115 | Temp 98.5°F | Ht 69.0 in | Wt 253.0 lb

## 2019-07-12 DIAGNOSIS — F329 Major depressive disorder, single episode, unspecified: Secondary | ICD-10-CM

## 2019-07-12 DIAGNOSIS — E559 Vitamin D deficiency, unspecified: Secondary | ICD-10-CM

## 2019-07-12 DIAGNOSIS — I10 Essential (primary) hypertension: Secondary | ICD-10-CM

## 2019-07-12 DIAGNOSIS — F419 Anxiety disorder, unspecified: Secondary | ICD-10-CM | POA: Diagnosis not present

## 2019-07-12 DIAGNOSIS — R739 Hyperglycemia, unspecified: Secondary | ICD-10-CM

## 2019-07-12 DIAGNOSIS — F32A Depression, unspecified: Secondary | ICD-10-CM

## 2019-07-12 MED ORDER — VENLAFAXINE HCL ER 150 MG PO CP24: 150.0000 mg | ORAL_CAPSULE | Freq: Every day | ORAL | 3 refills | Status: DC

## 2019-07-12 NOTE — Progress Notes (Addendum)
Subjective:    Patient ID: Jennifer Waters, female    DOB: 12-14-1960, 59 y.o.   MRN: [id number]  HPI  Here to f/u; overall doing ok,  Pt denies chest pain, increasing sob or doe, wheezing, orthopnea, PND, increased LE swelling, palpitations, dizziness or syncope.  Pt denies new neurological symptoms such as new headache, or facial or extremity weakness or numbness.  Pt denies polydipsia, polyuria, or low sugar episode.  Pt states overall good compliance with meds, mostly trying to follow appropriate diet, with wt overall stable,  but little exercise however.  Muscle relaxer as needed.  Plans to call later for mammogram.   Staying with daughter who pays all the bils, now depressed but no suicidal ideation, or panic; has ongoing anxiety, just wants to sleep all the time.  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 Daughter has no problem with all this.  Has been taking the effexor lwo dose and has nightmares when not taking it.  Past Medical History:  Diagnosis Date  . Arthritis   . Depression   . Hypertension   . Migraines    Past Surgical History:  Procedure Laterality Date  . ABDOMINAL HYSTERECTOMY    . BREAST MASS EXCISION     benign done as a child    reports that she has been smoking cigarettes. She has a 10.00 pack-year smoking history. She has never used smokeless tobacco. She reports previous alcohol use of about 5.0 standard drinks of alcohol per week. She reports current drug use. Frequency: 1.00 time per week. Drug: Marijuana. family history includes Arrhythmia in her sister; Arthritis in her mother and sister; Brain cancer in her brother; Diabetes in her brother; Edema in her sister; Lung cancer in her father; Migraines in her mother; Ovarian cancer in her mother. Allergies  Allergen Reactions  . Chantix [Varenicline] Nausea Only   Current Outpatient Medications on File Prior to Visit  Medication Sig Dispense Refill  . amLODipine (NORVASC) 10 MG  tablet Take 1 tablet (10 mg total) by mouth daily. 90 tablet 1  . cyclobenzaprine (FLEXERIL) 5 MG tablet Take 1 tablet (5 mg total) by mouth 3 (three) times daily as needed for muscle spasms. 90 tablet 2  . diclofenac sodium (VOLTAREN) 1 % GEL Apply 2 g topically 4 (four) times daily. 100 g 3  . fluticasone (FLONASE) 50 MCG/ACT nasal spray Place 2 sprays into both nostrils daily. 16 g 6  . gabapentin (NEURONTIN) 100 MG capsule Take 2 capsules (200 mg total) by mouth at bedtime. 180 capsule 0  . gabapentin (NEURONTIN) 300 MG capsule Take 1 capsule (300 mg total) by mouth 3 (three) times daily. 90 capsule 0  . hydrOXYzine (ATARAX/VISTARIL) 10 MG tablet Take 1 tablet (10 mg total) by mouth 3 (three) times daily as needed. 90 tablet 0  . metoprolol succinate (TOPROL-XL) 50 MG 24 hr tablet Take 1 tablet (50 mg total) by mouth daily. Take with or immediately following a meal. 90 tablet 3  . omeprazole (PRILOSEC) 20 MG capsule TAKE 1 CAPSULE BY MOUTH EVERY DAY 90 capsule 0  . traMADol (ULTRAM) 50 MG tablet Take 1 tablet (50 mg total) by mouth every 6 (six) hours as needed. 30 tablet 0  . valACYclovir (VALTREX) 500 MG tablet Take 1 tablet (500 mg total) by mouth 2 (two) times daily. 6 tablet 3  . albuterol (PROVENTIL HFA;VENTOLIN HFA) 108 (90 Base) MCG/ACT inhaler Inhale 2 puffs into the lungs every  6 (six) hours as needed for wheezing or shortness of breath. (Patient not taking: Reported on 07/12/2019) 1 Inhaler 0  . cetirizine (ZYRTEC) 10 MG tablet Take 1 tablet (10 mg total) by mouth daily. (Patient not taking: Reported on 07/12/2019) 90 tablet 0  . diclofenac (VOLTAREN) 75 MG EC tablet Take 1 tablet (75 mg total) by mouth 2 (two) times daily as needed. (Patient not taking: Reported on 07/12/2019) 60 tablet 5  . Vitamin D, Ergocalciferol, (DRISDOL) 1.25 MG (50000 UT) CAPS capsule Take 1 capsule (50,000 Units total) by mouth every 7 (seven) days. (Patient not taking: Reported on 07/12/2019) 12 capsule 0   No  current facility-administered medications on file prior to visit.   Review of Systems All otherwise neg per pt     Objective:   Physical Exam BP 138/76   Pulse (!) 115   Temp 98.5 F (36.9 C)   Ht 5\' 9"  (1.753 m)   Wt 253 lb (114.8 kg)   SpO2 93%   BMI 37.36 kg/m  VS noted,  Constitutional: Pt appears in NAD HENT: Head: NCAT.  Right Ear: External ear normal.  Left Ear: External ear normal.  Eyes: . Pupils are equal, round, and reactive to light. Conjunctivae and EOM are normal Nose: without d/c or deformity Neck: Neck supple. Gross normal ROM Cardiovascular: Normal rate and regular rhythm.   Pulmonary/Chest: Effort normal and breath sounds without rales or wheezing.  Abd:  Soft, NT, ND, + BS, no organomegaly Neurological: Pt is alert. At baseline orientation, motor grossly intact Skin: Skin is warm. No rashes, other new lesions, no LE edema Psychiatric: Pt behavior is normal without agitation , depressed affect All otherwise neg per pt Lab Results  Component Value Date   WBC 12.2 (H) 12/20/2018   HGB 14.5 12/20/2018   HCT 43.7 12/20/2018   PLT 283.0 12/20/2018   GLUCOSE 101 (H) 12/20/2018   CHOL 176 12/20/2018   TRIG 154.0 (H) 12/20/2018   HDL 42.60 12/20/2018   LDLCALC 103 (H) 12/20/2018   ALT 18 12/20/2018   AST 19 12/20/2018   NA 142 12/20/2018   K 4.1 12/20/2018   CL 108 12/20/2018   CREATININE 1.06 12/20/2018   BUN 15 12/20/2018   CO2 26 12/20/2018   TSH 0.93 12/20/2018   HGBA1C 5.9 12/20/2018         Assessment & Plan:

## 2019-07-12 NOTE — Patient Instructions (Addendum)
For the COVID vaccine - please call [phone] (option 2) for an appt, but if unable to get through quickly, please go to the web page:   [email or web address] to be placed on the wait list (an email and phone number are required) - you were signed up at the office  Please check your insurance to see if the Shingrix shot is covered  Please don't forget your mammogram  Please change to OTC Vitamin D3 at 2000 units per day, indefinitely.  Please take all new medication as prescribed - the increased effexor to 150 mg per day  Please continue all other medications as before, and refills have been done if requested.  Please have the pharmacy call with any other refills you may need.  Please continue your efforts at being more active, low cholesterol diet, and weight control.  Please keep your appointments with your specialists as you may have planned  Please make an Appointment to return in 6 months, or sooner if needed

## 2019-07-15 ENCOUNTER — Encounter: Payer: Self-pay | Admitting: Internal Medicine

## 2019-07-15 NOTE — Assessment & Plan Note (Signed)
stable overall by history and exam, recent data reviewed with pt, and pt to continue medical treatment as before,  to f/u any worsening symptoms or concerns  

## 2019-07-15 NOTE — Assessment & Plan Note (Addendum)
stable overall by history and exam, recent data reviewed with pt, and pt to continue medical treatment as before,  to f/u any worsening symptoms or concerns  I spent 58minutes preparing to see the patient by review of recent labs, imaging and procedures, obtaining and reviewing separately obtained history, communicating with the patient and family or caregiver, ordering medications, tests or procedures, and documenting clinical information in the EHR including the differential Dx, treatment, and any further evaluation and other management of hyeprglycemia, HTN, anxiety depresison, vit d def

## 2019-07-15 NOTE — Assessment & Plan Note (Signed)
For oral replacement 

## 2019-07-15 NOTE — Assessment & Plan Note (Signed)
Mild worsneing, for increased effexor 150 qd

## 2019-08-29 ENCOUNTER — Encounter: Payer: Self-pay | Admitting: General Practice

## 2019-09-01 ENCOUNTER — Other Ambulatory Visit: Payer: Self-pay | Admitting: Internal Medicine

## 2019-09-20 LAB — HM MAMMOGRAPHY

## 2019-09-28 ENCOUNTER — Encounter: Payer: Self-pay | Admitting: Internal Medicine

## 2019-10-22 ENCOUNTER — Other Ambulatory Visit: Payer: Self-pay

## 2019-10-22 MED ORDER — CYCLOBENZAPRINE HCL 5 MG PO TABS: ORAL_TABLET | ORAL | 1 refills | Status: DC

## 2019-10-22 MED ORDER — GABAPENTIN 100 MG PO CAPS: 200.0000 mg | ORAL_CAPSULE | Freq: Every day | ORAL | 1 refills | Status: DC

## 2019-10-22 NOTE — Telephone Encounter (Signed)
Ok done erx 

## 2019-10-22 NOTE — Telephone Encounter (Signed)
1.Medication Requested:gabapentin (NEURONTIN) 100 MG capsule cyclobenzaprine (FLEXERIL) 5 MG tablet  2. Pharmacy (Name, Street, [place]): [facility] on [address]   3. On Med List: Yes   4. Last Visit with PCP: 7.1.20  5. Next visit date with PCP: no appt is made at this time    Agent: Please be advised that RX refills may take up to 3 business days. We ask that you follow-up with your pharmacy.

## 2019-10-26 MED ORDER — GABAPENTIN 100 MG PO CAPS: 200.0000 mg | ORAL_CAPSULE | Freq: Every day | ORAL | 1 refills | Status: DC

## 2019-10-26 NOTE — Telephone Encounter (Signed)
New Message:    Pt is calling and states the new pharmacy has not received the prescription. The address where she would like it sent to is [place] ([facility])Jennifer Waters.Please resend.

## 2019-10-26 NOTE — Telephone Encounter (Signed)
Called [facility] and left message (pharmacy was at lunch) requesting the rx for gabapentin to be deleted.   New rx for gabapentin has been sent to [facility] as requested.   Tried to contact pt - vm box was full and I was not able to leave a message.

## 2019-11-21 ENCOUNTER — Encounter: Payer: Self-pay | Admitting: Family

## 2019-11-21 ENCOUNTER — Other Ambulatory Visit: Payer: Self-pay

## 2019-11-21 ENCOUNTER — Ambulatory Visit (INDEPENDENT_AMBULATORY_CARE_PROVIDER_SITE_OTHER): Payer: 59 | Admitting: Family

## 2019-11-21 VITALS — BP 150/82 | HR 88 | Temp 98.5°F | Ht 69.0 in | Wt 249.4 lb

## 2019-11-21 DIAGNOSIS — R252 Cramp and spasm: Secondary | ICD-10-CM

## 2019-11-21 DIAGNOSIS — R05 Cough: Secondary | ICD-10-CM

## 2019-11-21 DIAGNOSIS — J309 Allergic rhinitis, unspecified: Secondary | ICD-10-CM

## 2019-11-21 DIAGNOSIS — R059 Cough, unspecified: Secondary | ICD-10-CM

## 2019-11-21 DIAGNOSIS — M159 Polyosteoarthritis, unspecified: Secondary | ICD-10-CM

## 2019-11-21 DIAGNOSIS — Z23 Encounter for immunization: Secondary | ICD-10-CM

## 2019-11-21 MED ORDER — ALBUTEROL SULFATE HFA 108 (90 BASE) MCG/ACT IN AERS: 2.0000 | INHALATION_SPRAY | Freq: Four times a day (QID) | RESPIRATORY_TRACT | 2 refills | Status: DC | PRN

## 2019-11-21 MED ORDER — MONTELUKAST SODIUM 10 MG PO TABS: 10.0000 mg | ORAL_TABLET | Freq: Every day | ORAL | 3 refills | Status: DC

## 2019-11-21 NOTE — Progress Notes (Signed)
Jennifer Waters is a 59 y.o. female with the following history as recorded in EpicCare:  Patient Active Problem List   Diagnosis Date Noted  . Muscle cramping 03/07/2019  . Scapular dyskinesis 02/21/2019  . Patellofemoral arthritis of right knee 01/22/2019  . Right shoulder pain 12/22/2018  . Vitamin D deficiency 12/20/2018  . Cervical radiculopathy at C6 12/20/2018  . Hyperglycemia 12/20/2018  . Syncope 12/20/2018  . Obesity (BMI 35.0-39.9 without comorbidity) 03/10/2018  . Nonallopathic lesion of cervical region 12/02/2017  . Nonallopathic lesion of thoracic region 12/02/2017  . Nonallopathic lesion of rib cage 12/02/2017  . Encounter for general adult medical examination with abnormal findings 11/04/2017  . Tobacco abuse 07/08/2017  . Anxiety and depression 11/22/2016  . Cervical disc disorder with radiculopathy of cervical region 05/04/2016  . Cough 03/15/2016  . Snoring 12/11/2015  . GERD (gastroesophageal reflux disease) 12/11/2015  . Allergic rhinitis 05/01/2015  . Osteoarthritis 10/17/2014  . Essential hypertension 10/17/2014    Current Outpatient Medications  Medication Sig Dispense Refill  . albuterol (VENTOLIN HFA) 108 (90 Base) MCG/ACT inhaler Inhale 2 puffs into the lungs every 6 (six) hours as needed for wheezing or shortness of breath. 6.7 g 2  . amLODipine (NORVASC) 10 MG tablet Take 1 tablet (10 mg total) by mouth daily. 90 tablet 1  . cetirizine (ZYRTEC) 10 MG tablet Take 10 mg by mouth daily.    . cyclobenzaprine (FLEXERIL) 5 MG tablet TAKE ONE TABLET BY MOUTH THREE TIMES A DAY AS NEEDED FOR MUSCLE SPASMS 90 tablet 1  . diclofenac sodium (VOLTAREN) 1 % GEL Apply 2 g topically 4 (four) times daily. 100 g 3  . fluticasone (FLONASE) 50 MCG/ACT nasal spray Place into both nostrils daily.    Marland Kitchen gabapentin (NEURONTIN) 100 MG capsule Take 2 capsules (200 mg total) by mouth at bedtime. 180 capsule 1  . hydrOXYzine (ATARAX/VISTARIL) 10 MG tablet Take 1 tablet (10 mg  total) by mouth 3 (three) times daily as needed. 90 tablet 0  . metoprolol succinate (TOPROL-XL) 50 MG 24 hr tablet Take 1 tablet (50 mg total) by mouth daily. Take with or immediately following a meal. 90 tablet 3  . omeprazole (PRILOSEC) 20 MG capsule TAKE 1 CAPSULE BY MOUTH EVERY DAY 90 capsule 0  . valACYclovir (VALTREX) 500 MG tablet Take 1 tablet (500 mg total) by mouth 2 (two) times daily. 6 tablet 3  . venlafaxine XR (EFFEXOR XR) 150 MG 24 hr capsule Take 1 capsule (150 mg total) by mouth daily with breakfast. 90 capsule 3  . Vitamin D, Ergocalciferol, (DRISDOL) 1.25 MG (50000 UT) CAPS capsule Take 1 capsule (50,000 Units total) by mouth every 7 (seven) days. 12 capsule 0  . montelukast (SINGULAIR) 10 MG tablet Take 1 tablet (10 mg total) by mouth at bedtime. 30 tablet 3  . traMADol (ULTRAM) 50 MG tablet Take 1 tablet (50 mg total) by mouth every 6 (six) hours as needed. 30 tablet 0   No current facility-administered medications for this visit.    Allergies: Chantix [varenicline]  Past Medical History:  Diagnosis Date  . Arthritis   . Depression   . Hypertension   . Migraines     Past Surgical History:  Procedure Laterality Date  . ABDOMINAL HYSTERECTOMY    . BREAST MASS EXCISION     benign done as a child    Family History  Problem Relation Age of Onset  . Arthritis Mother   . Ovarian cancer Mother   .  Migraines Mother   . Lung cancer Father   . Arrhythmia Sister   . Brain cancer Brother   . Edema Sister   . Arthritis Sister   . Diabetes Brother   . Colon polyps Neg Hx   . Colon cancer Neg Hx   . Esophageal cancer Neg Hx   . Rectal cancer Neg Hx   . Stomach cancer Neg Hx     Social History   Tobacco Use  . Smoking status: Current Every Day Smoker    Packs/day: 0.50    Years: 20.00    Pack years: 10.00    Types: Cigarettes  . Smokeless tobacco: Never Used  Substance Use Topics  . Alcohol use: Not Currently    Alcohol/week: 5.0 standard drinks    Types: 5  Glasses of wine per week    Subjective:  Patient had originally made the visit to discuss the process for applying for disability; she has a chronic issue of numbness in her extremities/ "hurts all over"- she has been evaluated extensively and is taking Gabapentin, Flexeril and Tramadol;  She also notes that she is having increased problems with her allergies- has done allergy shots in the past; currently taking Flonase and Zyrtec; Feels very congested and occasionally wheezes; + smoker; asks for refill on albuterol; Would like to get her Shingles vaccine today;  Objective:  Vitals:   11/21/19 1006  BP: (!) 150/82  Pulse: 88  Temp: 98.5 F (36.9 C)  TempSrc: Oral  SpO2: 95%  Weight: 249 lb 6.4 oz (113.1 kg)  Height: 5\' 9"  (1.753 m)    General: Well developed, well nourished, in no acute distress  Skin : Warm and dry.  Head: Normocephalic and atraumatic  Lungs: Respirations unlabored; clear to auscultation bilaterally without wheeze, rales, rhonchi  CVS exam: normal rate and regular rhythm.  Musculoskeletal: No deformities; no active joint inflammation  Extremities: No edema, cyanosis, clubbing  Vessels: Symmetric bilaterally  Neurologic: Alert and oriented; speech intact; face symmetrical; moves all extremities well; CNII-XII intact without focal deficit   Assessment:  1. Allergic rhinitis, unspecified seasonality, unspecified trigger   2. Cough   3. Need for shingles vaccine   4. Muscle cramping   5. Osteoarthritis of multiple joints, unspecified osteoarthritis type     Plan:  1. Try adding Singulair to her current regimen; refill on albuterol; discussed allergy referral but she defers today; 3. Vaccine given as requested; 4. & 5. Explained to patient that she would need to contact Social Security to start the process of disability application; her PCP would not be the one to initiate this for her; she agrees and will discuss further with [name] at next [place].   This visit  occurred during the SARS-CoV-2 public health emergency.  Safety protocols were in place, including screening questions prior to the visit, additional usage of staff PPE, and extensive cleaning of exam room while observing appropriate contact time as indicated for disinfecting solutions.     Return in about 2 months (around 01/21/2020) for with [name] Shingles #2.  Orders Placed This Encounter  Procedures  . Varicella-zoster vaccine IM    Requested Prescriptions   Signed Prescriptions Disp Refills  . albuterol (VENTOLIN HFA) 108 (90 Base) MCG/ACT inhaler 6.7 g 2    Sig: Inhale 2 puffs into the lungs every 6 (six) hours as needed for wheezing or shortness of breath.  . montelukast (SINGULAIR) 10 MG tablet 30 tablet 3    Sig: Take 1  tablet (10 mg total) by mouth at bedtime.

## 2019-12-20 ENCOUNTER — Telehealth: Payer: Self-pay

## 2019-12-20 NOTE — Telephone Encounter (Deleted)
error 

## 2019-12-21 ENCOUNTER — Telehealth (INDEPENDENT_AMBULATORY_CARE_PROVIDER_SITE_OTHER): Payer: 59 | Admitting: Internal Medicine

## 2019-12-21 DIAGNOSIS — R05 Cough: Secondary | ICD-10-CM | POA: Diagnosis not present

## 2019-12-21 DIAGNOSIS — I10 Essential (primary) hypertension: Secondary | ICD-10-CM

## 2019-12-21 DIAGNOSIS — J4520 Mild intermittent asthma, uncomplicated: Secondary | ICD-10-CM | POA: Diagnosis not present

## 2019-12-21 DIAGNOSIS — R059 Cough, unspecified: Secondary | ICD-10-CM

## 2019-12-21 DIAGNOSIS — R739 Hyperglycemia, unspecified: Secondary | ICD-10-CM

## 2019-12-21 MED ORDER — AZITHROMYCIN 250 MG PO TABS: ORAL_TABLET | ORAL | 1 refills | Status: DC

## 2019-12-21 MED ORDER — HYDROCODONE-HOMATROPINE 5-1.5 MG/5ML PO SYRP: 5.0000 mL | ORAL_SOLUTION | Freq: Four times a day (QID) | ORAL | 0 refills | Status: DC | PRN

## 2019-12-21 MED ORDER — HYDROCODONE-HOMATROPINE 5-1.5 MG/5ML PO SYRP: 5.0000 mL | ORAL_SOLUTION | Freq: Four times a day (QID) | ORAL | 0 refills | Status: AC | PRN

## 2019-12-21 NOTE — Patient Instructions (Signed)
Please take all new medication as prescribed - the antibiotic, and cough medicine  Please continue all other medications as before, and refills have been done if requested.  Please have the pharmacy call with any other refills you may need.  Please keep your appointments with your specialists as you may have planned      

## 2019-12-21 NOTE — Progress Notes (Signed)
Patient ID: Jennifer Waters, female   DOB: June 22, 1960, 59 y.o.   MRN: [id number]  Virtual Visit via Video Note  I connected with Jennifer Waters on 12/21/19 at  2:20 PM EDT by a video enabled telemedicine application and verified that I am speaking with the correct person using two identifiers.  Location: of all participants today Patient: at home Provider: at office   I discussed the limitations of evaluation and management by telemedicine and the availability of in person appointments. The patient expressed understanding and agreed to proceed.  History of Present Illness: Here with acute onset mild to mod 2-3 days ST, HA, general weakness and malaise, with prod cough greenish sputum, but Pt denies chest pain, increased sob or doe, wheezing, orthopnea, PND, increased LE swelling, palpitations, dizziness or syncope, except for occasional mild wheezng not really worse in severity or frequency.   Pt denies polydipsia, polyuria,  Past Medical History:  Diagnosis Date  . Arthritis   . Depression   . Hypertension   . Migraines    Past Surgical History:  Procedure Laterality Date  . ABDOMINAL HYSTERECTOMY    . BREAST MASS EXCISION     benign done as a child    reports that she has been smoking cigarettes. She has a 10.00 pack-year smoking history. She has never used smokeless tobacco. She reports previous alcohol use of about 5.0 standard drinks of alcohol per week. She reports current drug use. Frequency: 1.00 time per week. Drug: Marijuana. family history includes Arrhythmia in her sister; Arthritis in her mother and sister; Brain cancer in her brother; Diabetes in her brother; Edema in her sister; Lung cancer in her father; Migraines in her mother; Ovarian cancer in her mother. Allergies  Allergen Reactions  . Chantix [Varenicline] Nausea Only   Current Outpatient Medications on File Prior to Visit  Medication Sig Dispense Refill  . albuterol (VENTOLIN HFA) 108 (90 Base) MCG/ACT  inhaler Inhale 2 puffs into the lungs every 6 (six) hours as needed for wheezing or shortness of breath. 6.7 g 2  . amLODipine (NORVASC) 10 MG tablet Take 1 tablet (10 mg total) by mouth daily. 90 tablet 1  . cetirizine (ZYRTEC) 10 MG tablet Take 10 mg by mouth daily.    . cyclobenzaprine (FLEXERIL) 5 MG tablet TAKE ONE TABLET BY MOUTH THREE TIMES A DAY AS NEEDED FOR MUSCLE SPASMS 90 tablet 1  . diclofenac sodium (VOLTAREN) 1 % GEL Apply 2 g topically 4 (four) times daily. 100 g 3  . fluticasone (FLONASE) 50 MCG/ACT nasal spray Place into both nostrils daily.    Marland Kitchen gabapentin (NEURONTIN) 100 MG capsule Take 2 capsules (200 mg total) by mouth at bedtime. 180 capsule 1  . hydrOXYzine (ATARAX/VISTARIL) 10 MG tablet Take 1 tablet (10 mg total) by mouth 3 (three) times daily as needed. 90 tablet 0  . metoprolol succinate (TOPROL-XL) 50 MG 24 hr tablet Take 1 tablet (50 mg total) by mouth daily. Take with or immediately following a meal. 90 tablet 3  . montelukast (SINGULAIR) 10 MG tablet Take 1 tablet (10 mg total) by mouth at bedtime. 30 tablet 3  . omeprazole (PRILOSEC) 20 MG capsule TAKE 1 CAPSULE BY MOUTH EVERY DAY 90 capsule 0  . traMADol (ULTRAM) 50 MG tablet Take 1 tablet (50 mg total) by mouth every 6 (six) hours as needed. 30 tablet 0  . valACYclovir (VALTREX) 500 MG tablet Take 1 tablet (500 mg total) by mouth 2 (two) times daily. 6  tablet 3  . venlafaxine XR (EFFEXOR XR) 150 MG 24 hr capsule Take 1 capsule (150 mg total) by mouth daily with breakfast. 90 capsule 3  . Vitamin D, Ergocalciferol, (DRISDOL) 1.25 MG (50000 UT) CAPS capsule Take 1 capsule (50,000 Units total) by mouth every 7 (seven) days. 12 capsule 0   No current facility-administered medications on file prior to visit.    Observations/Objective: Alert, NAD, appropriate mood and affect, resps normal, cn 2-12 intact, moves all 4s, no visible rash or swelling Lab Results  Component Value Date   WBC 12.2 (H) 12/20/2018   HGB  14.5 12/20/2018   HCT 43.7 12/20/2018   PLT 283.0 12/20/2018   GLUCOSE 101 (H) 12/20/2018   CHOL 176 12/20/2018   TRIG 154.0 (H) 12/20/2018   HDL 42.60 12/20/2018   LDLCALC 103 (H) 12/20/2018   ALT 18 12/20/2018   AST 19 12/20/2018   NA 142 12/20/2018   K 4.1 12/20/2018   CL 108 12/20/2018   CREATININE 1.06 12/20/2018   BUN 15 12/20/2018   CO2 26 12/20/2018   TSH 0.93 12/20/2018   HGBA1C 5.9 12/20/2018   Assessment and Plan: See notes  Follow Up Instructions: See notes   I discussed the assessment and treatment plan with the patient. The patient was provided an opportunity to ask questions and all were answered. The patient agreed with the plan and demonstrated an understanding of the instructions.   The patient was advised to call back or seek an in-person evaluation if the symptoms worsen or if the condition fails to improve as anticipated.   Cathlean Cower, MD

## 2019-12-23 ENCOUNTER — Encounter: Payer: Self-pay | Admitting: Internal Medicine

## 2019-12-23 DIAGNOSIS — J45909 Unspecified asthma, uncomplicated: Secondary | ICD-10-CM | POA: Insufficient documentation

## 2019-12-23 NOTE — Assessment & Plan Note (Signed)
To continue to monitor bp at home and next visit 

## 2019-12-23 NOTE — Assessment & Plan Note (Signed)
stable overall by history and exam, recent data reviewed with pt, and pt to continue medical treatment as before,  to f/u any worsening symptoms or concerns  

## 2019-12-23 NOTE — Assessment & Plan Note (Addendum)
Mild to mod, c/w bornchitis vs pna, declies cxr,  for antibx course, cough med prn,  to f/u any worsening symptoms or concerns  I spent 31 minutes in preparing to see the patient by review of recent labs, imaging and procedures, obtaining and reviewing separately obtained history, communicating with the patient and family or caregiver, ordering medications, tests or procedures, and documenting clinical information in the EHR including the differential Dx, treatment, and any further evaluation and other management of cough, asthma, htn, hyperglycemia

## 2019-12-23 NOTE — Assessment & Plan Note (Signed)
stable overall by history and exam, recent data reviewed with pt, and pt to continue medical treatment as before,  to f/u any worsening symptoms or concerns, cont inhaler prn

## 2020-01-03 ENCOUNTER — Telehealth: Payer: Self-pay | Admitting: Internal Medicine

## 2020-01-03 NOTE — Telephone Encounter (Signed)
Sent to Dr. John to advise. 

## 2020-01-03 NOTE — Telephone Encounter (Signed)
    Patient calling to report she is still coughing very bad, last seen 7/2 virtual visit Should she schedule another  Virtual appointment or is additional medication needed? She expressed concerns about possibly having an upper respiratory infection.

## 2020-01-03 NOTE — Telephone Encounter (Signed)
Please direct pt to UC asap, as we cannot r/o COVID are not allowed to see her type of case in the office, and virtual visit will not resolve the question.  thanks

## 2020-01-07 NOTE — Telephone Encounter (Signed)
LDVM for pt to please go to UC for her sxs as we do not see pt in office due to Shelby that is having her sxs.

## 2020-01-22 ENCOUNTER — Telehealth: Payer: Self-pay | Admitting: Internal Medicine

## 2020-01-22 NOTE — Telephone Encounter (Signed)
    Patient requesting refill on metoprolol succinate (TOPROL-XL) 50 MG 24 hr tablet Pharmacy:[facility] [place], [address] [facility] [address] AT [place] Appointment 02/05/20

## 2020-01-23 ENCOUNTER — Ambulatory Visit: Payer: 59 | Admitting: Internal Medicine

## 2020-01-24 ENCOUNTER — Other Ambulatory Visit: Payer: Self-pay

## 2020-01-24 MED ORDER — METOPROLOL SUCCINATE ER 50 MG PO TB24: 50.0000 mg | ORAL_TABLET | Freq: Every day | ORAL | 3 refills | Status: DC

## 2020-02-05 ENCOUNTER — Ambulatory Visit: Payer: 59 | Admitting: Internal Medicine

## 2020-02-05 DIAGNOSIS — Z0289 Encounter for other administrative examinations: Secondary | ICD-10-CM

## 2020-02-27 ENCOUNTER — Other Ambulatory Visit: Payer: Self-pay | Admitting: Internal Medicine

## 2020-02-28 ENCOUNTER — Ambulatory Visit (INDEPENDENT_AMBULATORY_CARE_PROVIDER_SITE_OTHER): Payer: 59 | Admitting: Internal Medicine

## 2020-02-28 ENCOUNTER — Other Ambulatory Visit: Payer: Self-pay

## 2020-02-28 ENCOUNTER — Encounter: Payer: Self-pay | Admitting: Internal Medicine

## 2020-02-28 VITALS — BP 140/90 | HR 108 | Temp 98.0°F | Ht 69.0 in | Wt 248.5 lb

## 2020-02-28 DIAGNOSIS — E559 Vitamin D deficiency, unspecified: Secondary | ICD-10-CM | POA: Diagnosis not present

## 2020-02-28 DIAGNOSIS — E538 Deficiency of other specified B group vitamins: Secondary | ICD-10-CM

## 2020-02-28 DIAGNOSIS — Z720 Tobacco use: Secondary | ICD-10-CM

## 2020-02-28 DIAGNOSIS — Z23 Encounter for immunization: Secondary | ICD-10-CM | POA: Diagnosis not present

## 2020-02-28 DIAGNOSIS — Z0000 Encounter for general adult medical examination without abnormal findings: Secondary | ICD-10-CM

## 2020-02-28 DIAGNOSIS — Z0001 Encounter for general adult medical examination with abnormal findings: Secondary | ICD-10-CM

## 2020-02-28 DIAGNOSIS — R519 Headache, unspecified: Secondary | ICD-10-CM

## 2020-02-28 DIAGNOSIS — I10 Essential (primary) hypertension: Secondary | ICD-10-CM

## 2020-02-28 DIAGNOSIS — G8929 Other chronic pain: Secondary | ICD-10-CM

## 2020-02-28 DIAGNOSIS — R739 Hyperglycemia, unspecified: Secondary | ICD-10-CM

## 2020-02-28 LAB — COMPLETE METABOLIC PANEL WITH GFR
AG Ratio: 2 (calc) (ref 1.0–2.5)
ALT: 21 U/L (ref 6–29)
AST: 18 U/L (ref 10–35)
Albumin: 4.5 g/dL (ref 3.6–5.1)
Alkaline phosphatase (APISO): 80 U/L (ref 37–153)
BUN: 15 mg/dL (ref 7–25)
CO2: 26 mmol/L (ref 20–32)
Calcium: 9.8 mg/dL (ref 8.6–10.4)
Chloride: 110 mmol/L (ref 98–110)
Creat: 0.85 mg/dL (ref 0.50–1.05)
GFR, Est African American: 88 mL/min/{1.73_m2} (ref >=60)
GFR, Est Non African American: 76 mL/min/{1.73_m2} (ref >=60)
Globulin: 2.3 g/dL (calc) (ref 1.9–3.7)
Glucose, Bld: 122 mg/dL — ABNORMAL HIGH (ref 65–99)
Potassium: 4.6 mmol/L (ref 3.5–5.3)
Sodium: 142 mmol/L (ref 135–146)
Total Bilirubin: 0.5 mg/dL (ref 0.2–1.2)
Total Protein: 6.8 g/dL (ref 6.1–8.1)

## 2020-02-28 MED ORDER — SUMATRIPTAN SUCCINATE 100 MG PO TABS: 100.0000 mg | ORAL_TABLET | ORAL | 11 refills | Status: DC | PRN

## 2020-02-28 MED ORDER — TRAMADOL HCL 50 MG PO TABS
50.0000 mg | ORAL_TABLET | Freq: Four times a day (QID) | ORAL | 1 refills | Status: DC | PRN
Start: 2020-02-28 — End: 2021-06-29

## 2020-02-28 MED ORDER — GABAPENTIN 100 MG PO CAPS
200.0000 mg | ORAL_CAPSULE | Freq: Every day | ORAL | 1 refills | Status: DC
Start: 2020-02-28 — End: 2023-07-28

## 2020-02-28 NOTE — Progress Notes (Signed)
Subjective:    Patient ID: Jennifer Waters, female    DOB: Jun 18, 1961, 59 y.o.   MRN: [id number]  HPI  Here for wellness and f/u;  Overall doing ok;  Pt denies Chest pain, worsening SOB, DOE, wheezing, orthopnea, PND, worsening LE edema, palpitations, dizziness or syncope.  Pt denies neurological change such as new headache, facial or extremity weakness.  Pt denies polydipsia, polyuria, or low sugar symptoms. Pt states overall good compliance with treatment and medications, good tolerability, and has been trying to follow appropriate diet.  Pt denies worsening depressive symptoms, suicidal ideation or panic. No fever, night sweats, wt loss, loss of appetite, or other constitutional symptoms.  Pt states good ability with ADL's, has low fall risk, home safety reviewed and adequate, no other significant changes in hearing or vision, and only occasionally active with exercise. No longer seeing HA specialist as he retired mostly and the copay is $100, and applying for disability;  Was getting dilaudid #10 pills per 3 months. Stil having mild, mod and severe HAs at least twice per day.  Was getting depakote preventive with labs every 6 mo.  Tyring to get medicad so abe to get back to specialists.   Past Medical History:  Diagnosis Date  . Arthritis   . Depression   . Hypertension   . Migraines    Past Surgical History:  Procedure Laterality Date  . ABDOMINAL HYSTERECTOMY    . BREAST MASS EXCISION     benign done as a child    reports that she has been smoking cigarettes. She has a 10.00 pack-year smoking history. She has never used smokeless tobacco. She reports previous alcohol use of about 5.0 standard drinks of alcohol per week. She reports current drug use. Frequency: 1.00 time per week. Drug: Marijuana. family history includes Arrhythmia in her sister; Arthritis in her mother and sister; Brain cancer in her brother; Diabetes in her brother; Edema in her sister; Lung cancer in her father;  Migraines in her mother; Ovarian cancer in her mother. Allergies  Allergen Reactions  . Chantix [Varenicline] Nausea Only   Current Outpatient Medications on File Prior to Visit  Medication Sig Dispense Refill  . albuterol (VENTOLIN HFA) 108 (90 Base) MCG/ACT inhaler Inhale 2 puffs into the lungs every 6 (six) hours as needed for wheezing or shortness of breath. 6.7 g 2  . amLODipine (NORVASC) 10 MG tablet Take 1 tablet (10 mg total) by mouth daily. 90 tablet 1  . cetirizine (ZYRTEC) 10 MG tablet Take 10 mg by mouth daily.    . citalopram (CELEXA) 10 MG tablet Take 10 mg by mouth daily.    . cyclobenzaprine (FLEXERIL) 5 MG tablet TAKE 1 TABLET BY MOUTH THREE TIMES DAILY AS NEEDED 90 tablet 1  . diclofenac sodium (VOLTAREN) 1 % GEL Apply 2 g topically 4 (four) times daily. 100 g 3  . fluticasone (FLONASE) 50 MCG/ACT nasal spray Place into both nostrils daily.    . hydrOXYzine (ATARAX/VISTARIL) 10 MG tablet Take 1 tablet (10 mg total) by mouth 3 (three) times daily as needed. 90 tablet 0  . metoprolol succinate (TOPROL-XL) 50 MG 24 hr tablet Take 1 tablet (50 mg total) by mouth daily. Take with or immediately following a meal. 90 tablet 3  . montelukast (SINGULAIR) 10 MG tablet Take 1 tablet (10 mg total) by mouth at bedtime. 30 tablet 3  . omeprazole (PRILOSEC) 20 MG capsule TAKE 1 CAPSULE BY MOUTH EVERY DAY 90 capsule 0  .  valACYclovir (VALTREX) 500 MG tablet Take 1 tablet (500 mg total) by mouth 2 (two) times daily. 6 tablet 3  . venlafaxine XR (EFFEXOR XR) 150 MG 24 hr capsule Take 1 capsule (150 mg total) by mouth daily with breakfast. 90 capsule 3   No current facility-administered medications on file prior to visit.   Review of Systems All otherwise neg per pt    Objective:   Physical Exam BP 140/90 (BP Location: Left Arm, Patient Position: Sitting, Cuff Size: Large)   Pulse (!) 108   Temp 98 F (36.7 C) (Oral)   Ht 5\' 9"  (1.753 m)   Wt 248 lb 8 oz (112.7 kg)   SpO2 93%   BMI  36.70 kg/m  VS noted,  Constitutional: Pt appears in NAD HENT: Head: NCAT.  Right Ear: External ear normal.  Left Ear: External ear normal.  Eyes: . Pupils are equal, round, and reactive to light. Conjunctivae and EOM are normal Nose: without d/c or deformity Neck: Neck supple. Gross normal ROM Cardiovascular: Normal rate and regular rhythm.   Pulmonary/Chest: Effort normal and breath sounds without rales or wheezing.  Abd:  Soft, NT, ND, + BS, no organomegaly Neurological: Pt is alert. At baseline orientation, motor grossly intact Skin: Skin is warm. No rashes, other new lesions, no LE edema Psychiatric: Pt behavior is normal without agitation  All otherwise neg per pt Lab Results  Component Value Date   WBC 10.9 (H) 02/28/2020   HGB 13.8 02/28/2020   HCT 42.6 02/28/2020   PLT 303 02/28/2020   GLUCOSE 122 (H) 02/28/2020   CHOL 189 02/28/2020   TRIG 118 02/28/2020   HDL 47 (L) 02/28/2020   LDLCALC 119 (H) 02/28/2020   ALT 21 02/28/2020   AST 18 02/28/2020   NA 142 02/28/2020   K 4.6 02/28/2020   CL 110 02/28/2020   CREATININE 0.85 02/28/2020   BUN 15 02/28/2020   CO2 26 02/28/2020   TSH 0.99 02/28/2020   HGBA1C 5.9 12/20/2018      Assessment & Plan:

## 2020-02-28 NOTE — Patient Instructions (Addendum)
You had the Shingles shot #2 today  Please take all new medication as prescribed - the generic  imitrex for migraine  Please continue all other medications as before, and refills have been done if requested.  Please have the pharmacy call with any other refills you may need.  Please continue your efforts at being more active, low cholesterol diet, and weight control.  You are otherwise up to date with prevention measures today.  Please keep your appointments with your specialists as you may have planned  Please go to the LAB at the blood drawing area for the tests to be done  You will be contacted by phone if any changes need to be made immediately.  Otherwise, you will receive a letter about your results with an explanation, but please check with MyChart first.  Please remember to sign up for MyChart if you have not done so, as this will be important to you in the future with finding out test results, communicating by private email, and scheduling acute appointments online when needed.  Please stop smoking  Please make an Appointment to return in 6 months, or sooner if needed

## 2020-02-29 ENCOUNTER — Encounter: Payer: Self-pay | Admitting: Internal Medicine

## 2020-02-29 ENCOUNTER — Other Ambulatory Visit: Payer: Self-pay | Admitting: Internal Medicine

## 2020-02-29 LAB — TSH: TSH: 0.99 mIU/L (ref 0.40–4.50)

## 2020-02-29 LAB — LIPID PANEL
Cholesterol: 189 mg/dL (ref <200)
HDL: 47 mg/dL — ABNORMAL LOW (ref >=50)
LDL Cholesterol (Calc): 119 mg/dL (calc) — ABNORMAL HIGH
Non-HDL Cholesterol (Calc): 142 mg/dL (calc) — ABNORMAL HIGH (ref <130)
Total CHOL/HDL Ratio: 4 (calc) (ref <5.0)
Triglycerides: 118 mg/dL (ref <150)

## 2020-02-29 LAB — URINALYSIS, ROUTINE W REFLEX MICROSCOPIC
Bilirubin Urine: NEGATIVE
Glucose, UA: NEGATIVE
Hgb urine dipstick: NEGATIVE
Ketones, ur: NEGATIVE
Leukocytes,Ua: NEGATIVE
Nitrite: NEGATIVE
Protein, ur: NEGATIVE
Specific Gravity, Urine: 1.022 (ref 1.001–1.03)
pH: <=5 (ref 5.0–8.0)

## 2020-02-29 LAB — CBC WITH DIFFERENTIAL/PLATELET
Absolute Monocytes: 752 cells/uL (ref 200–950)
Basophils Absolute: 87 cells/uL (ref 0–200)
Basophils Relative: 0.8 %
Eosinophils Absolute: 131 cells/uL (ref 15–500)
Eosinophils Relative: 1.2 %
HCT: 42.6 % (ref 35.0–45.0)
Hemoglobin: 13.8 g/dL (ref 11.7–15.5)
Lymphs Abs: 4491 cells/uL — ABNORMAL HIGH (ref 850–3900)
MCH: 29.7 pg (ref 27.0–33.0)
MCHC: 32.4 g/dL (ref 32.0–36.0)
MCV: 91.8 fL (ref 80.0–100.0)
MPV: 11.7 fL (ref 7.5–12.5)
Monocytes Relative: 6.9 %
Neutro Abs: 5439 cells/uL (ref 1500–7800)
Neutrophils Relative %: 49.9 %
Platelets: 303 10*3/uL (ref 140–400)
RBC: 4.64 10*6/uL (ref 3.80–5.10)
RDW: 12.5 % (ref 11.0–15.0)
Total Lymphocyte: 41.2 %
WBC: 10.9 10*3/uL — ABNORMAL HIGH (ref 3.8–10.8)

## 2020-02-29 LAB — VITAMIN B12: Vitamin B-12: 418 pg/mL (ref 200–1100)

## 2020-02-29 LAB — VITAMIN D 25 HYDROXY (VIT D DEFICIENCY, FRACTURES): Vit D, 25-Hydroxy: 26 ng/mL — ABNORMAL LOW (ref 30–100)

## 2020-02-29 MED ORDER — VITAMIN D (ERGOCALCIFEROL) 1.25 MG (50000 UNIT) PO CAPS: 50000.0000 [IU] | ORAL_CAPSULE | ORAL | 0 refills | Status: DC

## 2020-03-01 ENCOUNTER — Encounter: Payer: Self-pay | Admitting: Internal Medicine

## 2020-03-01 DIAGNOSIS — R519 Headache, unspecified: Secondary | ICD-10-CM | POA: Insufficient documentation

## 2020-03-01 NOTE — Assessment & Plan Note (Addendum)
For imitrex prn  I spent 31 minutes in preparing to see the patient by review of recent labs, imaging and procedures, obtaining and reviewing separately obtained history, communicating with the patient and family or caregiver, ordering medications, tests or procedures, and documenting clinical information in the EHR including the differential Dx, treatment, and any further evaluation and other management of ha, htn, hyperglycemia, vit d def, smoker

## 2020-03-01 NOTE — Assessment & Plan Note (Signed)
Counseled to quit 

## 2020-03-01 NOTE — Assessment & Plan Note (Signed)
stable overall by history and exam, recent data reviewed with pt, and pt to continue medical treatment as before,  to f/u any worsening symptoms or concerns  

## 2020-03-01 NOTE — Assessment & Plan Note (Signed)

## 2020-03-01 NOTE — Assessment & Plan Note (Signed)
Cont replacement 

## 2020-06-16 ENCOUNTER — Other Ambulatory Visit: Payer: Self-pay | Admitting: Internal Medicine

## 2020-06-16 ENCOUNTER — Other Ambulatory Visit: Payer: Self-pay | Admitting: Family

## 2020-06-18 ENCOUNTER — Telehealth: Payer: Self-pay | Admitting: Internal Medicine

## 2020-06-18 MED ORDER — MONTELUKAST SODIUM 10 MG PO TABS: ORAL_TABLET | ORAL | 3 refills | Status: DC

## 2020-06-18 MED ORDER — CYCLOBENZAPRINE HCL 5 MG PO TABS
5.0000 mg | ORAL_TABLET | Freq: Three times a day (TID) | ORAL | 1 refills | Status: DC | PRN
Start: 2020-06-18 — End: 2020-10-22

## 2020-06-18 NOTE — Telephone Encounter (Signed)
Done erx 

## 2020-06-18 NOTE — Telephone Encounter (Signed)
1.Medication Requested: cyclobenzaprine (FLEXERIL) 5 MG tablet  montelukast (SINGULAIR) 10 MG tablet     2. Pharmacy (Name, Street, [place]): [facility] [phone] - [place], [place] - [address]  3. On Med List: yes   4. Last Visit with PCP: 9.9.21  5. Next visit date with PCP: 1.6.22   Agent: Please be advised that RX refills may take up to 3 business days. We ask that you follow-up with your pharmacy.

## 2020-06-18 NOTE — Telephone Encounter (Signed)
Sent to Dr. John. 

## 2020-06-20 IMAGING — XA DG INJECT/[PERSON_NAME] INC NEEDLE/CATH/PLC EPI/CERV/THOR W/IMG
2 series · 2 of 2 positions shown · non-contrast
Comparison: none

CLINICAL DATA: Right neck pain. Right greater than left cervical
radiculopathy. Displacement of cervical discs C[DATE]-C6-7.

[Series 1: ortho adipose · 1 of 1 slices shown (1 of 2)]
[im 1/1]
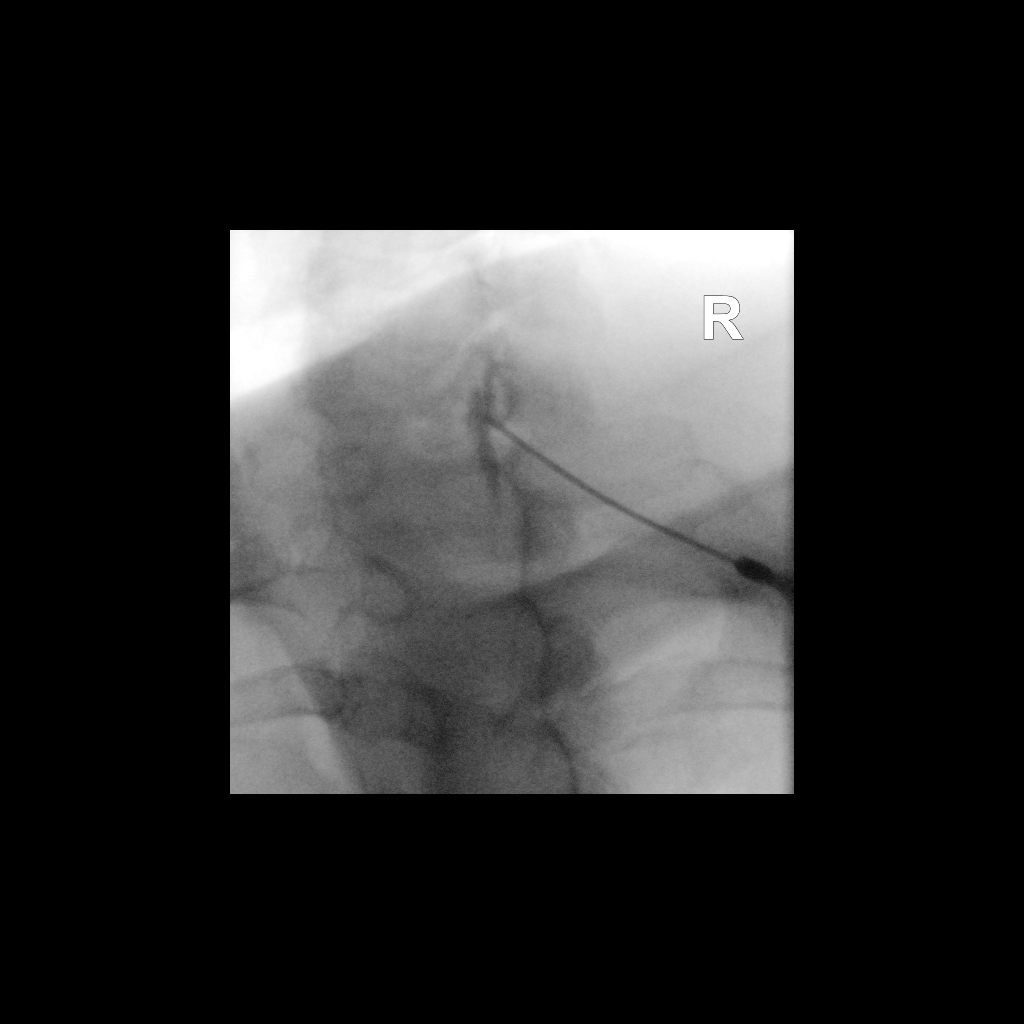

[Series 2: ortho adipose · 1 of 1 slices shown (2 of 2)]
[im 1/1]
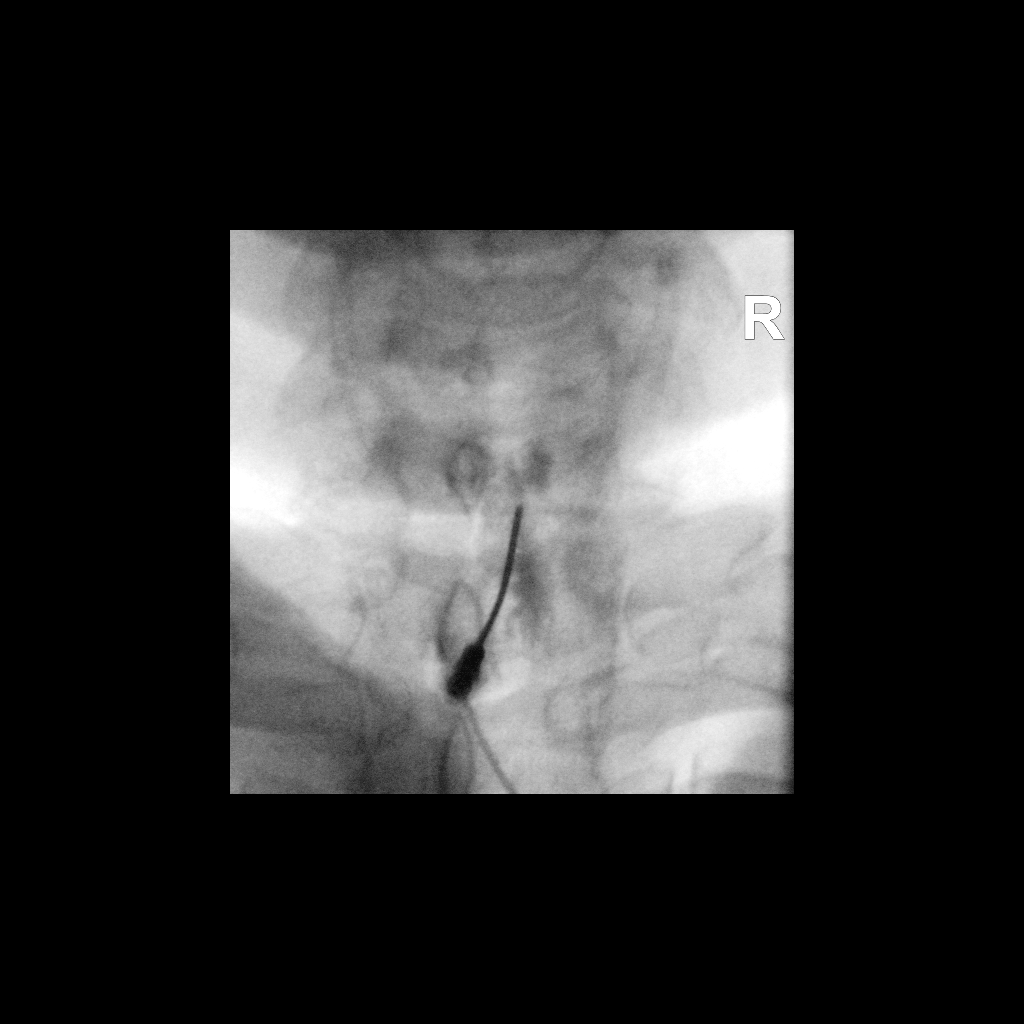

[2 of 2 positions shown; findings below may reference images not displayed]

FLUOROSCOPY TIME:  Radiation Exposure Index (as provided by the
fluoroscopic device): 15.27 uGy*m2

PROCEDURE:
CERVICAL EPIDURAL INJECTION

An interlaminar approach was performed on the right at C7-T1. A 20
gauge epidural needle was advanced using loss-of-resistance
technique.

DIAGNOSTIC EPIDURAL INJECTION

Injection of Isovue-M 300 shows a good epidural pattern with spread
above and below the level of needle placement, primarily on the
right. No vascular opacification is seen. THERAPEUTIC

EPIDURAL INJECTION

1.5 ml of Kenalog 40 mixed with 1 ml of 1% Lidocaine and 2 ml of
normal saline were then instilled. The procedure was well-tolerated,
and the patient was discharged thirty minutes following the
injection in good condition.
IMPRESSION: Technically successful first epidural injection on the right at
C7-T1.

## 2020-06-26 ENCOUNTER — Ambulatory Visit: Payer: 59 | Admitting: Internal Medicine

## 2020-06-30 ENCOUNTER — Ambulatory Visit: Payer: Self-pay | Admitting: Cardiology

## 2020-07-24 ENCOUNTER — Other Ambulatory Visit: Payer: Self-pay

## 2020-07-24 ENCOUNTER — Encounter: Payer: Self-pay | Admitting: Cardiology

## 2020-07-24 ENCOUNTER — Ambulatory Visit (INDEPENDENT_AMBULATORY_CARE_PROVIDER_SITE_OTHER): Payer: Self-pay | Admitting: Cardiology

## 2020-07-24 VITALS — BP 160/100 | HR 84 | Ht 69.0 in | Wt 245.0 lb

## 2020-07-24 DIAGNOSIS — E669 Obesity, unspecified: Secondary | ICD-10-CM

## 2020-07-24 DIAGNOSIS — E7849 Other hyperlipidemia: Secondary | ICD-10-CM

## 2020-07-24 DIAGNOSIS — Z720 Tobacco use: Secondary | ICD-10-CM

## 2020-07-24 DIAGNOSIS — I10 Essential (primary) hypertension: Secondary | ICD-10-CM

## 2020-07-24 DIAGNOSIS — Z79899 Other long term (current) drug therapy: Secondary | ICD-10-CM

## 2020-07-24 MED ORDER — ATORVASTATIN CALCIUM 10 MG PO TABS: 10.0000 mg | ORAL_TABLET | Freq: Every day | ORAL | 3 refills | Status: DC

## 2020-07-24 MED ORDER — LOSARTAN POTASSIUM-HCTZ 50-12.5 MG PO TABS: 1.0000 | ORAL_TABLET | Freq: Every day | ORAL | 3 refills | Status: DC

## 2020-07-24 MED ORDER — AMLODIPINE BESYLATE 10 MG PO TABS: 10.0000 mg | ORAL_TABLET | Freq: Every day | ORAL | 3 refills | Status: DC

## 2020-07-24 NOTE — Patient Instructions (Addendum)
Medication Instructions:   start taking  Amlodipine 10 mg one tablet daily    Losaratn /hctz ( hydrochlorothiazide) 50/12.5 mg one tablet daily     Atorvastatin 10 mg  One tablet daily   *If you need a refill on your cardiac medications before your next appointment, please call your pharmacy*   Lab Work: cmp - Today Lipid fasting  BMP - 3 weeks  Before next appointment  If you have labs (blood work) drawn today and your tests are completely normal, you will receive your results only by: Marland Kitchen MyChart Message (if you have MyChart) OR . A paper copy in the mail If you have any lab test that is abnormal or we need to change your treatment, we will call you to review the results.   Testing/Procedures: Not needed   Follow-Up: At [facility], you and your health needs are our priority.  As part of our continuing mission to provide you with exceptional heart care, we have created designated Provider Care Teams.  These Care Teams include your primary Cardiologist (physician) and Advanced Practice Providers (APPs -  Physician Assistants and Nurse Practitioners) who all work together to provide you with the care you need, when you need it.  We recommend signing up for the patient portal called "MyChart".  Sign up information is provided on this After Visit Summary.  MyChart is used to connect with patients for Virtual Visits (Telemedicine).  Patients are able to view lab/test results, encounter notes, upcoming appointments, etc.  Non-urgent messages can be sent to your provider as well.   To learn more about what you can do with MyChart, go to [email or web address].    Your next appointment:   4 month(s)  The format for your next appointment:   In Person  Provider:   Glenetta Hew, MD   Other Instructions Your physician recommends that you schedule a follow-up appointment in 3 weeks to see [facility] - blood pressure  Will have  [place] team to  contact you with  assistance

## 2020-07-24 NOTE — Progress Notes (Signed)
Primary Care Provider: Biagio Borg, MD Cardiologist: No primary care provider on file. Electrophysiologist: None  Clinic Note: Chief Complaint  Patient presents with  . Hypertension    She has been out of her medications for a while now.  Has not been seen, therefore her medications have not been refilled.  Starting of the symptoms of swelling and headache and dyspnea.  . Shortness of Breath    On exertion  . Follow-up    Way overdue   ===================================  ASSESSMENT/PLAN   Problem List Items Addressed This Visit    Obesity (BMI 35.0-39.9 without comorbidity) (Chronic)    We discussed weight loss as a major way of avoiding multiple blood pressure medications, and the potential requirement for CPAP.  Discussed smart food choices and also the need for exercise      Relevant Orders   Lipid panel (Completed)   Comprehensive metabolic panel (Completed)   Basic metabolic panel   Hyperlipidemia due to dietary fat intake (Chronic)    With obesity and hypertension along with borderline diabetes (A1c of 5.9), she is just about metabolic syndrome.  I do not think that she could bring her LDL down below 100 without assistance.  Plan: Atorvastatin 10 mg daily (also on the [employer] $4 plan = probably also [employer])      Relevant Medications   amLODipine (NORVASC) 10 MG tablet   losartan-hydrochlorothiazide (HYZAAR) 50-12.5 MG tablet   atorvastatin (LIPITOR) 10 MG tablet   Essential hypertension - Primary (Chronic)    Poorly controlled blood pressure.  I am going to prescribe medications that her on the [employer] $4 plan.  Based on her demographic-ARB/HCTZ and amlodipine of the best options.  Plan: Amlodipine 10 mg daily, losartan-HCTZ 50/12.5 mg twice daily. We will check baseline chemistry now and then again in 3 weeks before follow-up visit with [facility] clinical pharmacist for blood pressure check..      Relevant Medications   amLODipine (NORVASC) 10  MG tablet   losartan-hydrochlorothiazide (HYZAAR) 50-12.5 MG tablet   atorvastatin (LIPITOR) 10 MG tablet   Other Relevant Orders   EKG 12-Lead (Completed)   Lipid panel (Completed)   Comprehensive metabolic panel (Completed)   Basic metabolic panel   Tobacco abuse (Chronic)    Needs to quit-can save money and prevent longstanding health problems.  Smoking cessation instruction/counseling given:  counseled patient on the dangers of tobacco use, advised patient to stop smoking, and reviewed strategies to maximize success       Other Visit Diagnoses    Medication management       Relevant Orders   Basic metabolic panel     ===================================  HPI:    Jennifer Waters is a 60 y.o. female with a PMH notable for HYPERTENSION, OBESITY (likely OSA), s/p PPM, & Chronic Smoker who presents today for way of follow-up with complaints of off-and-on exertional dyspnea, swelling and headaches.  Her blood pressure has been running very high.  Has run out above her medications.  Jennifer Waters was last seen by me on 03/10/2018 as a follow-up from her chest pain evaluation.  When I saw her in February 2019 she had uncontrolled hypertension and increase her amlodipine to 10 mg and added Bystolic 20 mg.  She was supposed to be followed up by [facility] Run by Our Clinical Pharmacist).  We also discussed the possibility of OSA evaluation.  Her last cardiology visit was a telemedicine visit on January 04, 2019 by Jory Sims, DNP.  Unfortunately, we did not have blood pressure recordings on the day.  She was currently unemployed and therefore had lost her insurance/prescription drug coverage.  Clearly not able to afford Bystolic.  She was able to get medications with the assistance of her daughter who is employee of [employer].  Had not yet done sleep study (insurance issues) => no changes  Recent Hospitalizations: None  Reviewed  CV  studies:    The following studies were reviewed today: (if available, images/films reviewed: From Epic Chart or Care Everywhere) . Echo in July 2020: Reviewed below with [facility]   Interval History:   Jennifer Waters presents here to the ER stating that she really has been out of most of her medications.  Medications reconciled she is actually taking is not Flexeril, gabapentin and venlafaxine.  She is almost out of sumatriptan.  She has not been taking her Toprol or amlodipine. Unfortunately, she did not work since December.  As such, she is not able to afford her medications.  She has noted off-and-on exertional dyspnea for the last several months.  Started getting worse in December. She may have a lot of orthopnea and PND, but no significant edema..  No chest pain or pressure except some tightness when she is short of breath.  Usually this associate with high blood pressures.  She thankfully has not had dangerously high pressures leading to ER visits.  CV Review of Symptoms (Summary) Cardiovascular ROS: positive for - dyspnea on exertion, orthopnea, paroxysmal nocturnal dyspnea and Exercise intolerance, fatigue negative for - chest pain, irregular heartbeat, palpitations, rapid heart rate, shortness of breath or Lightheadedness or dizziness, syncope/near syncope or TIA/amaurosis fugax, claudication  The patient does not have symptoms concerning for COVID-19 infection (fever, chills, cough, or new shortness of breath).   REVIEWED OF SYSTEMS   Review of Systems  Constitutional: Positive for malaise/fatigue. Negative for weight loss.  HENT: Negative for congestion and nosebleeds.   Respiratory: Negative for cough.   Gastrointestinal: Negative for blood in stool and melena.  Genitourinary: Negative for hematuria.  Musculoskeletal: Negative for joint pain.  Neurological: Positive for headaches. Negative for dizziness, tingling, seizures and weakness.  Psychiatric/Behavioral: Negative for  depression and memory loss. The patient is not nervous/anxious and does not have insomnia.    I have reviewed and (if needed) personally updated the patient's problem list, medications, allergies, past medical and surgical history, social and family history.   PAST MEDICAL HISTORY   Past Medical History:  Diagnosis Date  . Arthritis   . Depression   . Hypertension   . Migraines   . Obesity     PAST SURGICAL HISTORY   Past Surgical History:  Procedure Laterality Date  . ABDOMINAL HYSTERECTOMY    . BREAST MASS EXCISION     benign done as a child  . TRANSTHORACIC ECHOCARDIOGRAM  12/2018    EF 55-60%.  Impaired relaxation but indeterminate filling pressures.  Normal RV size and function.  Normal valves.  Normal aortic root.   Immunization History  Administered Date(s) Administered  . Influenza,inj,Quad PF,6+ Mos 05/01/2015, 05/04/2016, 02/25/2017, 02/17/2018, 03/07/2019, 02/28/2020  . PFIZER(Purple Top)SARS-COV-2 Vaccination 01/16/2020, 02/13/2020  . Tdap 06/03/2017  . Zoster Recombinat (Shingrix) 11/21/2019, 02/28/2020    MEDICATIONS/ALLERGIES   Current Meds  Medication Sig  . albuterol (VENTOLIN HFA) 108 (90 Base) MCG/ACT inhaler Inhale 2 puffs into the lungs every 6 (six) hours as needed for wheezing or shortness of breath.  Marland Kitchen atorvastatin (  LIPITOR) 10 MG tablet Take 1 tablet (10 mg total) by mouth daily.  . citalopram (CELEXA) 10 MG tablet Take 10 mg by mouth daily.  . cyclobenzaprine (FLEXERIL) 5 MG tablet Take 1 tablet (5 mg total) by mouth 3 (three) times daily as needed.  . gabapentin (NEURONTIN) 100 MG capsule Take 2 capsules (200 mg total) by mouth at bedtime.  Marland Kitchen losartan-hydrochlorothiazide (HYZAAR) 50-12.5 MG tablet Take 1 tablet by mouth daily.  . SUMAtriptan (IMITREX) 100 MG tablet Take 1 tablet (100 mg total) by mouth every 2 (two) hours as needed for migraine or headache. May repeat in 2 hours if headache persists or recurs.  . traMADol (ULTRAM) 50 MG tablet  Take 1 tablet (50 mg total) by mouth every 6 (six) hours as needed.  . venlafaxine XR (EFFEXOR XR) 150 MG 24 hr capsule Take 1 capsule (150 mg total) by mouth daily with breakfast.    Allergies  Allergen Reactions  . Chantix [Varenicline] Nausea Only    SOCIAL HISTORY/FAMILY HISTORY   Reviewed in Epic:  Pertinent findings:  Social History   Tobacco Use  . Smoking status: Current Every Day Smoker    Packs/day: 0.50    Years: 20.00    Pack years: 10.00    Types: Cigarettes  . Smokeless tobacco: Never Used  Vaping Use  . Vaping Use: Never used  Substance Use Topics  . Alcohol use: Not Currently    Alcohol/week: 5.0 standard drinks    Types: 5 Glasses of wine per week  . Drug use: Yes    Frequency: 1.0 times per week    Types: Marijuana   Social History   Social History Narrative   Fun: Puzzles, cats   Denies any religious beliefs effecting health care. Lives with daughter in a one story home.  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  Education: some college.    OBJCTIVE -PE, EKG, labs   Wt Readings from Last 3 Encounters:  07/24/20 245 lb (111.1 kg)  02/28/20 248 lb 8 oz (112.7 kg)  11/21/19 249 lb 6.4 oz (113.1 kg)    Physical Exam: BP (!) 160/100 (BP Location: Left Arm, Patient Position: Sitting)   Pulse 84   Ht 5\' 9"  (1.753 m)   Wt 245 lb (111.1 kg)   SpO2 97%   BMI 36.18 kg/m  Physical Exam Vitals reviewed.  Constitutional:      General: She is not in acute distress.    Appearance: She is well-developed. She is obese. She is not ill-appearing or toxic-appearing.  HENT:     Head: Normocephalic and atraumatic.  Neck:     Vascular: No carotid bruit, hepatojugular reflux or JVD.  Cardiovascular:     Rate and Rhythm: Normal rate and regular rhythm.  No extrasystoles are present.    Chest Wall: PMI is not displaced.     Pulses: Normal pulses.     Heart sounds: S1 normal and S2 normal. Heart sounds are distant. No murmur heard. No friction rub. Gallop present.  S4 sounds present.   Pulmonary:     Effort: Pulmonary effort is normal. No respiratory distress.     Breath sounds: Normal breath sounds. No wheezing, rhonchi or rales.  Musculoskeletal:        General: Swelling (Trivial-1+ bilateral LE) present. Normal range of motion.     Cervical back: Normal range of motion and neck supple.  Neurological:     General: No focal deficit present.     Mental Status: She  is alert and oriented to person, place, and time.     Motor: No weakness.     Gait: Gait normal.  Psychiatric:        Mood and Affect: Mood normal.        Behavior: Behavior normal.        Thought Content: Thought content normal.        Judgment: Judgment normal.     Adult ECG Report  Rate: 84;  Rhythm: normal sinus rhythm and Nonspecific ST and T wave changes.  Normal axis, intervals and durations.;   Narrative Interpretation: Stable  Recent Labs: Reviewed-rechecked during this visit. Lab Results  Component Value Date   CHOL 196 07/24/2020   HDL 53 07/24/2020   LDLCALC 123 (H) 07/24/2020   TRIG 111 07/24/2020   CHOLHDL 3.7 07/24/2020   Lab Results  Component Value Date   CREATININE 0.92 07/24/2020   BUN 12 07/24/2020   NA 145 (H) 07/24/2020   K 4.7 07/24/2020   CL 107 (H) 07/24/2020   CO2 23 07/24/2020   CBC Latest Ref Rng & Units 02/28/2020 12/20/2018 11/04/2017  WBC 3.8 - 10.8 Thousand/uL 10.9(H) 12.2(H) 8.1  Hemoglobin 11.7 - 15.5 g/dL 13.8 14.5 14.1  Hematocrit 35.0 - 45.0 % 42.6 43.7 41.4  Platelets 140 - 400 Thousand/uL 303 283.0 257.0    Lab Results  Component Value Date   TSH 0.99 02/28/2020    ==================================================  COVID-19 Education: The signs and symptoms of COVID-19 were discussed with the patient and how to seek care for testing (follow up with PCP or arrange E-visit).   The importance of social distancing and COVID-19 vaccination was discussed today. The patient is practicing social distancing & Masking.   I spent a  total of 75minutes with the patient spent in direct patient consultation.  Additional time spent with chart review  / charting (studies, outside notes, etc): 20 min Total Time: 43 min   Current medicines are reviewed at length with the patient today.  (+/- concerns) n/a  This visit occurred during the SARS-CoV-2 public health emergency.  Safety protocols were in place, including screening questions prior to the visit, additional usage of staff PPE, and extensive cleaning of exam room while observing appropriate contact time as indicated for disinfecting solutions.  Notice: This dictation was prepared with Dragon dictation along with smaller phrase technology. Any transcriptional errors that result from this process are unintentional and may not be corrected upon review.  Patient Instructions / Medication Changes & Studies & Tests Ordered   Patient Instructions  Medication Instructions:   start taking  Amlodipine 10 mg one tablet daily    Losaratn /hctz ( hydrochlorothiazide) 50/12.5 mg one tablet daily     Atorvastatin 10 mg  One tablet daily   *If you need a refill on your cardiac medications before your next appointment, please call your pharmacy*   Lab Work: cmp - Today Lipid fasting  BMP - 3 weeks  Before next appointment  If you have labs (blood work) drawn today and your tests are completely normal, you will receive your results only by: Marland Kitchen MyChart Message (if you have MyChart) OR . A paper copy in the mail If you have any lab test that is abnormal or we need to change your treatment, we will call you to review the results.   Testing/Procedures: Not needed   Follow-Up: At [facility], you and your health needs are our priority.  As part of our continuing mission to  provide you with exceptional heart care, we have created designated Provider Care Teams.  These Care Teams include your primary Cardiologist (physician) and Advanced Practice Providers (APPs -  Physician  Assistants and Nurse Practitioners) who all work together to provide you with the care you need, when you need it.  We recommend signing up for the patient portal called "MyChart".  Sign up information is provided on this After Visit Summary.  MyChart is used to connect with patients for Virtual Visits (Telemedicine).  Patients are able to view lab/test results, encounter notes, upcoming appointments, etc.  Non-urgent messages can be sent to your provider as well.   To learn more about what you can do with MyChart, go to [email or web address].    Your next appointment:   4 month(s)  The format for your next appointment:   In Person  Provider:   Glenetta Hew, MD   Other Instructions Your physician recommends that you schedule a follow-up appointment in 3 weeks to see [facility] - blood pressure  Will have  [place] team to  contact you with assistance    Studies Ordered:   Orders Placed This Encounter  Procedures  . Lipid panel  . Comprehensive metabolic panel  . Basic metabolic panel  . EKG 12-Lead     Glenetta Hew, M.D., M.S. Interventional Cardiologist   Pager # [phone] Phone # [phone] [address] [place], [place] [address]   Thank you for choosing Heartcare at [facility]!!

## 2020-07-25 ENCOUNTER — Telehealth: Payer: Self-pay | Admitting: Licensed Clinical Social Worker

## 2020-07-25 LAB — COMPREHENSIVE METABOLIC PANEL
ALT: 27 IU/L (ref 0–32)
AST: 22 IU/L (ref 0–40)
Albumin/Globulin Ratio: 2 (ref 1.2–2.2)
Albumin: 4.8 g/dL (ref 3.8–4.9)
Alkaline Phosphatase: 89 IU/L (ref 44–121)
BUN/Creatinine Ratio: 13 (ref 9–23)
BUN: 12 mg/dL (ref 6–24)
Bilirubin Total: 0.4 mg/dL (ref 0.0–1.2)
CO2: 23 mmol/L (ref 20–29)
Calcium: 10.1 mg/dL (ref 8.7–10.2)
Chloride: 107 mmol/L — ABNORMAL HIGH (ref 96–106)
Creatinine, Ser: 0.92 mg/dL (ref 0.57–1.00)
GFR calc Af Amer: 79 mL/min/{1.73_m2} (ref >59)
GFR calc non Af Amer: 69 mL/min/{1.73_m2} (ref >59)
Globulin, Total: 2.4 g/dL (ref 1.5–4.5)
Glucose: 131 mg/dL — ABNORMAL HIGH (ref 65–99)
Potassium: 4.7 mmol/L (ref 3.5–5.2)
Sodium: 145 mmol/L — ABNORMAL HIGH (ref 134–144)
Total Protein: 7.2 g/dL (ref 6.0–8.5)

## 2020-07-25 LAB — LIPID PANEL
Chol/HDL Ratio: 3.7 ratio (ref 0.0–4.4)
Cholesterol, Total: 196 mg/dL (ref 100–199)
HDL: 53 mg/dL (ref >39)
LDL Chol Calc (NIH): 123 mg/dL — ABNORMAL HIGH (ref 0–99)
Triglycerides: 111 mg/dL (ref 0–149)
VLDL Cholesterol Cal: 20 mg/dL (ref 5–40)

## 2020-07-25 NOTE — Progress Notes (Signed)
Heart and Vascular Care Navigation  07/25/2020  Jennifer Waters 1960/07/17 [id number]  Reason for Referral:  Loss of employment, financial challenges related to medication assistance/pcp                                                                                                   Assessment:                                     LCSW received referral requesting for LCSW to reach out to pt regarding PCP and medication assistance after losing job.   LCSW called and was able to reach pt at [phone]. Introduced self, role, reason for call. Pt confirmed home address, PCP and emergency contact remains her daughter Jennifer Waters. Pt has access to phone and internet at home. She lives with her daughter and her new grandchild which she is thrilled about. Pt is unemployed after losing her job in 2019. She previously had employer coverage but after being let go it was too expensive to maintain. She applied for Medicaid and Disability and was only awarded [employer] therefore she went onto [employer] and signed up for her current [employer] coverage. She also has hired a Chief Executive Officer to assist with her disability claim.   Pt shares that she has been able to keep her current PCP and get her medications but sometimes costs take their toll. She is not sure if her PCP is "in network" with her current plan. I explained that she needs to speak with the PCP office regarding that and in regards to any copayments that they may charge with current coverage. Pt shares that she was able to get many of her medications at low to no cost with her insurance and the goodrx card at [facility] and was pleased with that. I shared that the GoodRx program is a good resource to find the lowest cost options and that we often assist pts with getting medications sent to the appropriate pharmacy to ensure that they are affordable. Pt appreciative of knowing this and plans to call her PCP office regarding the above.   As previously  noted pt daughter just had her first child and pt shares her pleasure at being a grandmother. Pt daughter had been the one paying the bills but due to her high risk pregnancy she has been out of work for a bit. Plan will be for her to return, they currently are a month behind on their mortgage. She was not aware that assistance programs existed and gives permission to be referred to any assistance they can receive. I explained that I would mail information and make a referral to [facility] for Housing and [employer].   Pt aware I remain available for any additional questions/concerns moving forward.    HRT/VAS Care Coordination    Patients Home Cardiology Office [place] Team Social Worker   Social Worker Name: [place], LCSW, [facility]   Living arrangements for the past 2 months  Single Family Home   Lives with: Adult Children; Other (Comment)  grandchild   Patient Current Librarian, academic   Patient Has Concern With Paying Medical Bills No   Does Patient Have Prescription Coverage? Yes  explained how to access/utilize GoodRx      Social History:                                                                             SDOH Screenings   Alcohol Screen: Not on file  Depression (PHQ2-9): Low Risk   . PHQ-2 Score: 0  Financial Resource Strain: High Risk  . Difficulty of Paying Living Expenses: Hard  Food Insecurity: No Food Insecurity  . Worried About Charity fundraiser in the Last Year: Never true  . Ran Out of Food in the Last Year: Never true  Housing: Low Risk   . Last Housing Risk Score: 0  Physical Activity: Not on file  Social Connections: Not on file  Stress: Not on file  Tobacco Use: High Risk  . Smoking Tobacco Use: Current Every Day Smoker  . Smokeless Tobacco Use: Never Used  Transportation Needs: No Transportation Needs  . Lack of Transportation (Medical): No  . Lack of Transportation  (Non-Medical): No    SDOH Interventions: Financial Resources:  Financial Strain Interventions: Other (Comment) (GoodRx, Engineer, maintenance (IT), Agricultural consultant)  Food Insecurity:  Food Insecurity Interventions: Intervention Not Indicated  Housing Insecurity:  Housing Interventions: Other (Comment) (pt behind on mortgage payment x1 month provided county assistance programs; [employer] for Housing and [employer] Studies)  Transportation:   Transportation Interventions: Intervention Not Indicated    Other Care Navigation Interventions:     Provided Pharmacy assistance resources  GoodRx explained, copay cards unfortunately not applicable for any of pt current medications   Follow-up plan:   LCSW mailed information about mortgage assistance programs to pt home address, I also included county assistance w/ utilities. A call was made to [facility] for Housing and [employer] Studies with pt name and number for them to assess for additional assistance programs.

## 2020-08-03 ENCOUNTER — Encounter: Payer: Self-pay | Admitting: Cardiology

## 2020-08-03 DIAGNOSIS — E1169 Type 2 diabetes mellitus with other specified complication: Secondary | ICD-10-CM | POA: Insufficient documentation

## 2020-08-03 DIAGNOSIS — E7849 Other hyperlipidemia: Secondary | ICD-10-CM | POA: Insufficient documentation

## 2020-08-03 NOTE — Assessment & Plan Note (Signed)
Poorly controlled blood pressure.  I am going to prescribe medications that her on the [employer] $4 plan.  Based on her demographic-ARB/HCTZ and amlodipine of the best options.  Plan: Amlodipine 10 mg daily, losartan-HCTZ 50/12.5 mg twice daily. We will check baseline chemistry now and then again in 3 weeks before follow-up visit with [facility] clinical pharmacist for blood pressure check.Marland Kitchen

## 2020-08-03 NOTE — Assessment & Plan Note (Signed)
Needs to quit-can save money and prevent longstanding health problems.  Smoking cessation instruction/counseling given:  counseled patient on the dangers of tobacco use, advised patient to stop smoking, and reviewed strategies to maximize success

## 2020-08-03 NOTE — Assessment & Plan Note (Signed)
We discussed weight loss as a major way of avoiding multiple blood pressure medications, and the potential requirement for CPAP.  Discussed smart food choices and also the need for exercise

## 2020-08-03 NOTE — Assessment & Plan Note (Signed)
With obesity and hypertension along with borderline diabetes (A1c of 5.9), she is just about metabolic syndrome.  I do not think that she could bring her LDL down below 100 without assistance.  Plan: Atorvastatin 10 mg daily (also on the [employer] $4 plan = probably also [employer])

## 2020-08-14 ENCOUNTER — Ambulatory Visit: Payer: 59

## 2020-08-14 NOTE — Progress Notes (Deleted)
08/14/2020 Jennifer Waters 01-Jan-1961 [id number]   HPI:  Jennifer Waters is a 60 y.o. female patient of Dr Ellyn Hack, with a PMH below who presents today for hypertension clinic evaluation.  She was seen by him last about 3 weeks ago, at which time her blood pressure was elevated at 160/100.  She was not working and had been out of her medications for some time.  She is currently insured with [employer], however has a $4,950 deductible for medications.  He started her back on amlodipine 10 mg and losartan hctz 50/12.5 mg, as well as atorvastatin 10mg .  These are all available on the [facility] $4 list  Past Medical History: hyperlipidemia LDL at - now on atorvastatin 10  Pre-diabetes A1c 5.9  Tobacco abuse   obesity   migraines      Blood Pressure Goal:  130/80  Current Medications:   Family Hx:  Social Hx:  Marland Kitchen Diet:   Exercise:   Home BP readings:   Intolerances:   Labs: 07/24/20:  Na 145, K 4.7, Glu 131, BUN 12, SCr 0.92 GFR 79   TC 196, TG 111, HDL 53, LDL 123   Wt Readings from Last 3 Encounters:  07/24/20 245 lb (111.1 kg)  02/28/20 248 lb 8 oz (112.7 kg)  11/21/19 249 lb 6.4 oz (113.1 kg)   BP Readings from Last 3 Encounters:  07/24/20 (!) 160/100  02/28/20 140/90  11/21/19 (!) 150/82   Pulse Readings from Last 3 Encounters:  07/24/20 84  02/28/20 (!) 108  11/21/19 88    Current Outpatient Medications  Medication Sig Dispense Refill  . albuterol (VENTOLIN HFA) 108 (90 Base) MCG/ACT inhaler Inhale 2 puffs into the lungs every 6 (six) hours as needed for wheezing or shortness of breath. 6.7 g 2  . amLODipine (NORVASC) 10 MG tablet Take 1 tablet (10 mg total) by mouth daily. 90 tablet 3  . atorvastatin (LIPITOR) 10 MG tablet Take 1 tablet (10 mg total) by mouth daily. 90 tablet 3  . citalopram (CELEXA) 10 MG tablet Take 10 mg by mouth daily.    . cyclobenzaprine (FLEXERIL) 5 MG tablet Take 1 tablet (5 mg total) by mouth 3 (three) times daily as  needed. 90 tablet 1  . gabapentin (NEURONTIN) 100 MG capsule Take 2 capsules (200 mg total) by mouth at bedtime. 180 capsule 1  . losartan-hydrochlorothiazide (HYZAAR) 50-12.5 MG tablet Take 1 tablet by mouth daily. 90 tablet 3  . omeprazole (PRILOSEC) 20 MG capsule TAKE 1 CAPSULE BY MOUTH EVERY DAY (Patient not taking: Reported on 07/24/2020) 90 capsule 0  . SUMAtriptan (IMITREX) 100 MG tablet Take 1 tablet (100 mg total) by mouth every 2 (two) hours as needed for migraine or headache. May repeat in 2 hours if headache persists or recurs. 10 tablet 11  . traMADol (ULTRAM) 50 MG tablet Take 1 tablet (50 mg total) by mouth every 6 (six) hours as needed. 60 tablet 1  . venlafaxine XR (EFFEXOR XR) 150 MG 24 hr capsule Take 1 capsule (150 mg total) by mouth daily with breakfast. 90 capsule 3   No current facility-administered medications for this visit.    Allergies  Allergen Reactions  . Chantix [Varenicline] Nausea Only    Past Medical History:  Diagnosis Date  . Arthritis   . Depression   . Hypertension   . Migraines   . Obesity     There were no vitals taken for this visit.  No problem-specific  Assessment & Plan notes found for this encounter.   Tommy Medal PharmD CPP [facility] [address] [place], [place] [address] [phone]

## 2020-08-15 ENCOUNTER — Other Ambulatory Visit: Payer: Self-pay

## 2020-08-15 ENCOUNTER — Encounter: Payer: Self-pay | Admitting: Pharmacist Clinician (PhC)/ Clinical Pharmacy Specialist

## 2020-08-15 ENCOUNTER — Ambulatory Visit (INDEPENDENT_AMBULATORY_CARE_PROVIDER_SITE_OTHER): Payer: 59 | Admitting: Pharmacist Clinician (PhC)/ Clinical Pharmacy Specialist

## 2020-08-15 DIAGNOSIS — I10 Essential (primary) hypertension: Secondary | ICD-10-CM

## 2020-08-15 NOTE — Assessment & Plan Note (Signed)
Patient with essential hypertension, appears well controlled on three medications.  Will have her go to lab today for follow up metabolic panel.  She was encouraged to check home BP twice daily, just 3-4 days each week, so we can determine if pressure doing well overall.   Reviewed options for increasing exercise and dietary options for weight loss.  If her labs look good today, we will see her back in 2 months for follow up.

## 2020-08-15 NOTE — Patient Instructions (Signed)
Return for a a follow up appointment April 19 at 9 am  Go to the lab today to repeat metabolic panel  Check your blood pressure at home twice daily 3-4 days each week and keep record of the readings.  Take your BP meds as follows:  Continue with current medications  Bring all of your meds, your BP cuff and your record of home blood pressures to your next appointment.  Exercise as you're able, try to walk approximately 30 minutes per day.  Keep salt intake to a minimum, especially watch canned and prepared boxed foods.  Eat more fresh fruits and vegetables and fewer canned items.  Avoid eating in fast food restaurants.    HOW TO TAKE YOUR BLOOD PRESSURE: . Rest 5 minutes before taking your blood pressure. .  Don't smoke or drink caffeinated beverages for at least 30 minutes before. . Take your blood pressure before (not after) you eat. . Sit comfortably with your back supported and both feet on the floor (don't cross your legs). . Elevate your arm to heart level on a table or a desk. . Use the proper sized cuff. It should fit smoothly and snugly around your bare upper arm. There should be enough room to slip a fingertip under the cuff. The bottom edge of the cuff should be 1 inch above the crease of the elbow. . Ideally, take 3 measurements at one sitting and record the average.

## 2020-08-15 NOTE — Progress Notes (Signed)
08/15/2020 Jennifer Waters 07-Apr-1961 [id number]   HPI:  Jennifer Waters is a 60 y.o. female patient of Dr Ellyn Hack, with a PMH below who presents today for hypertension clinic evaluation.  He saw her in the office earlier this month, at which time her pressure was noted to be 160/100.  She was started on amlodipine 10 mg and losartan/hctz 50/12.5 mg, both once daily.  Patient notes that she has had hypertension since she was in her 64's.  It was noted when she developed migraines, and treated by that doctor for several years before her PCP took over.  Currently patient has [facility], which has a deductible of almost $5,000 for pharmacy.  Because of that Dr. Ellyn Hack chose medications that were available on the [facility] $4 list. Patient was appreciative of the low costs.  She currently lives with daughter and her significant other as well as their newborn son.    Today she returns for follow up.  She has a blood pressure cuff at home, although admits she hasn't checked her readings since seeing Dr. Ellyn Hack.  She also needs to repeat metabolic panel today, now that she has been on losartan/hctz for 3 weeks.  She has had no side effects or concerns about the medications.    Past Medical History: hyperlipidemia LDL - now on atorvastatin 10 mg qd  Tobacco abouse Counseled by MD at last visit, will offer Health Coach  migrtaines Uses sumatriptan, tramadol prn  Anxiety/depression On citalopram, venlafaxine    Blood Pressure Goal:  130/80  Current Medications: amlodipine 10 mg qd, losartan hctz 50/12.5 mg qd - all in am  Family Hx: both deceased, father died at 19 from emphysema (when she was 8), mother died from CHF at 29; emphysema at 7 dad; has 3 siblings, all 39-16 years older than her; oldest sister with PPM, brother with DM, toes amputated, other sis with lymphadema  Social Hx: occasional smoker, not in house with baby (3 months); occasional wine (about once monthly);  coffee in am at home drinks 2-3 cups per day, otherwise caffeine free  Diet: mostly home cooked meals; plenty of salads, broccoli, asparagus; bought som slim fast to help loose weight  Exercise: none regularly  Home BP readings: none with her today  Intolerances: chantix - caused nausea  Labs: 07/24/20:  Na 145, K 4.7, Glu 131, BUN 12, SCr 0.92 GFR 79   TC 196, TG 111, HDL 53, LDL 123   Wt Readings from Last 3 Encounters:  07/24/20 245 lb (111.1 kg)  02/28/20 248 lb 8 oz (112.7 kg)  11/21/19 249 lb 6.4 oz (113.1 kg)   BP Readings from Last 3 Encounters:  08/15/20 118/68  07/24/20 (!) 160/100  02/28/20 140/90   Pulse Readings from Last 3 Encounters:  08/15/20 82  07/24/20 84  02/28/20 (!) 108    Current Outpatient Medications  Medication Sig Dispense Refill  . albuterol (VENTOLIN HFA) 108 (90 Base) MCG/ACT inhaler Inhale 2 puffs into the lungs every 6 (six) hours as needed for wheezing or shortness of breath. 6.7 g 2  . amLODipine (NORVASC) 10 MG tablet Take 1 tablet (10 mg total) by mouth daily. 90 tablet 3  . atorvastatin (LIPITOR) 10 MG tablet Take 1 tablet (10 mg total) by mouth daily. 90 tablet 3  . citalopram (CELEXA) 10 MG tablet Take 10 mg by mouth daily.    . cyclobenzaprine (FLEXERIL) 5 MG tablet Take 1 tablet (5 mg total)  by mouth 3 (three) times daily as needed. 90 tablet 1  . gabapentin (NEURONTIN) 100 MG capsule Take 2 capsules (200 mg total) by mouth at bedtime. 180 capsule 1  . losartan-hydrochlorothiazide (HYZAAR) 50-12.5 MG tablet Take 1 tablet by mouth daily. 90 tablet 3  . omeprazole (PRILOSEC) 20 MG capsule TAKE 1 CAPSULE BY MOUTH EVERY DAY (Patient not taking: Reported on 07/24/2020) 90 capsule 0  . SUMAtriptan (IMITREX) 100 MG tablet Take 1 tablet (100 mg total) by mouth every 2 (two) hours as needed for migraine or headache. May repeat in 2 hours if headache persists or recurs. 10 tablet 11  . traMADol (ULTRAM) 50 MG tablet Take 1 tablet (50 mg total) by  mouth every 6 (six) hours as needed. 60 tablet 1  . venlafaxine XR (EFFEXOR XR) 150 MG 24 hr capsule Take 1 capsule (150 mg total) by mouth daily with breakfast. 90 capsule 3   No current facility-administered medications for this visit.    Allergies  Allergen Reactions  . Chantix [Varenicline] Nausea Only    Past Medical History:  Diagnosis Date  . Arthritis   . Depression   . Hypertension   . Migraines   . Obesity     Blood pressure 118/68, pulse 82.  Essential hypertension Patient with essential hypertension, appears well controlled on three medications.  Will have her go to lab today for follow up metabolic panel.  She was encouraged to check home BP twice daily, just 3-4 days each week, so we can determine if pressure doing well overall.   Reviewed options for increasing exercise and dietary options for weight loss.  If her labs look good today, we will see her back in 2 months for follow up.     Tommy Medal PharmD CPP [facility] [address] [place], [place] [address] [phone]

## 2020-08-16 LAB — BASIC METABOLIC PANEL
BUN/Creatinine Ratio: 11 (ref 9–23)
BUN: 11 mg/dL (ref 6–24)
CO2: 23 mmol/L (ref 20–29)
Calcium: 10 mg/dL (ref 8.7–10.2)
Chloride: 101 mmol/L (ref 96–106)
Creatinine, Ser: 0.96 mg/dL (ref 0.57–1.00)
GFR calc Af Amer: 75 mL/min/{1.73_m2} (ref >59)
GFR calc non Af Amer: 65 mL/min/{1.73_m2} (ref >59)
Glucose: 151 mg/dL — ABNORMAL HIGH (ref 65–99)
Potassium: 4.3 mmol/L (ref 3.5–5.2)
Sodium: 141 mmol/L (ref 134–144)

## 2020-08-18 ENCOUNTER — Telehealth: Payer: Self-pay | Admitting: Internal Medicine

## 2020-08-18 NOTE — Telephone Encounter (Signed)
Please to call pt - we should see her soon at her convenience to follow up high sugars - diabetes

## 2020-08-18 NOTE — Telephone Encounter (Signed)
-----   Message from Leonie Man, MD sent at 08/18/2020  1:41 PM EST ----- Chemistry panel essentially normal with exception of elevated blood sugar levels-diabetes. Normal kidney function.  Glenetta Hew, MD

## 2020-08-19 ENCOUNTER — Telehealth: Payer: Self-pay

## 2020-08-19 NOTE — Telephone Encounter (Signed)
Left pt a voicemail to come in and be seen in our office for further evaluation for sugar levels.

## 2020-08-21 ENCOUNTER — Encounter: Payer: Self-pay | Admitting: Internal Medicine

## 2020-08-21 ENCOUNTER — Ambulatory Visit (INDEPENDENT_AMBULATORY_CARE_PROVIDER_SITE_OTHER): Payer: 59 | Admitting: Internal Medicine

## 2020-08-21 ENCOUNTER — Other Ambulatory Visit: Payer: Self-pay

## 2020-08-21 VITALS — BP 124/82 | HR 90 | Ht 69.0 in | Wt 249.0 lb

## 2020-08-21 DIAGNOSIS — I10 Essential (primary) hypertension: Secondary | ICD-10-CM

## 2020-08-21 DIAGNOSIS — Z0000 Encounter for general adult medical examination without abnormal findings: Secondary | ICD-10-CM

## 2020-08-21 DIAGNOSIS — R739 Hyperglycemia, unspecified: Secondary | ICD-10-CM

## 2020-08-21 DIAGNOSIS — E7849 Other hyperlipidemia: Secondary | ICD-10-CM | POA: Diagnosis not present

## 2020-08-21 DIAGNOSIS — Z0001 Encounter for general adult medical examination with abnormal findings: Secondary | ICD-10-CM

## 2020-08-21 DIAGNOSIS — E559 Vitamin D deficiency, unspecified: Secondary | ICD-10-CM | POA: Diagnosis not present

## 2020-08-21 LAB — HEMOGLOBIN A1C: Hgb A1c MFr Bld: 7 % — ABNORMAL HIGH (ref 4.6–6.5)

## 2020-08-21 LAB — URINALYSIS, ROUTINE W REFLEX MICROSCOPIC
Bilirubin Urine: NEGATIVE
Hgb urine dipstick: NEGATIVE
Ketones, ur: NEGATIVE
Leukocytes,Ua: NEGATIVE
Nitrite: NEGATIVE
Specific Gravity, Urine: 1.025 (ref 1.000–1.030)
Total Protein, Urine: NEGATIVE
Urine Glucose: NEGATIVE
Urobilinogen, UA: 0.2 (ref 0.0–1.0)
pH: 6 (ref 5.0–8.0)

## 2020-08-21 LAB — BASIC METABOLIC PANEL
BUN: 14 mg/dL (ref 6–23)
CO2: 30 mEq/L (ref 19–32)
Calcium: 9.9 mg/dL (ref 8.4–10.5)
Chloride: 105 mEq/L (ref 96–112)
Creatinine, Ser: 0.82 mg/dL (ref 0.40–1.20)
GFR: 78.66 mL/min (ref >60.00)
Glucose, Bld: 171 mg/dL — ABNORMAL HIGH (ref 70–99)
Potassium: 4.1 mEq/L (ref 3.5–5.1)
Sodium: 139 mEq/L (ref 135–145)

## 2020-08-21 LAB — LIPID PANEL
Cholesterol: 133 mg/dL (ref 0–200)
HDL: 45.4 mg/dL (ref >39.00)
LDL Cholesterol: 67 mg/dL (ref 0–99)
NonHDL: 87.24
Total CHOL/HDL Ratio: 3
Triglycerides: 103 mg/dL (ref 0.0–149.0)
VLDL: 20.6 mg/dL (ref 0.0–40.0)

## 2020-08-21 LAB — CBC WITH DIFFERENTIAL/PLATELET
Basophils Absolute: 0.1 10*3/uL (ref 0.0–0.1)
Basophils Relative: 0.8 % (ref 0.0–3.0)
Eosinophils Absolute: 0.1 10*3/uL (ref 0.0–0.7)
Eosinophils Relative: 1.3 % (ref 0.0–5.0)
HCT: 40 % (ref 36.0–46.0)
Hemoglobin: 13.5 g/dL (ref 12.0–15.0)
Lymphocytes Relative: 35.9 % (ref 12.0–46.0)
Lymphs Abs: 3.2 10*3/uL (ref 0.7–4.0)
MCHC: 33.7 g/dL (ref 30.0–36.0)
MCV: 88.5 fl (ref 78.0–100.0)
Monocytes Absolute: 0.7 10*3/uL (ref 0.1–1.0)
Monocytes Relative: 8.4 % (ref 3.0–12.0)
Neutro Abs: 4.8 10*3/uL (ref 1.4–7.7)
Neutrophils Relative %: 53.6 % (ref 43.0–77.0)
Platelets: 221 10*3/uL (ref 150.0–400.0)
RBC: 4.53 Mil/uL (ref 3.87–5.11)
RDW: 12.7 % (ref 11.5–15.5)
WBC: 8.9 10*3/uL (ref 4.0–10.5)

## 2020-08-21 LAB — HEPATIC FUNCTION PANEL
ALT: 19 U/L (ref 0–35)
AST: 16 U/L (ref 0–37)
Albumin: 4.1 g/dL (ref 3.5–5.2)
Alkaline Phosphatase: 69 U/L (ref 39–117)
Bilirubin, Direct: 0.1 mg/dL (ref 0.0–0.3)
Total Bilirubin: 0.5 mg/dL (ref 0.2–1.2)
Total Protein: 6.8 g/dL (ref 6.0–8.3)

## 2020-08-21 LAB — VITAMIN D 25 HYDROXY (VIT D DEFICIENCY, FRACTURES): VITD: 31.97 ng/mL (ref 30.00–100.00)

## 2020-08-21 LAB — TSH: TSH: 1.23 u[IU]/mL (ref 0.35–4.50)

## 2020-08-21 NOTE — Patient Instructions (Signed)
Please continue all other medications as before, and refills have been done if requested. ° °Please have the pharmacy call with any other refills you may need. ° °Please continue your efforts at being more active, low cholesterol diet, and weight control. ° °You are otherwise up to date with prevention measures today. ° °Please keep your appointments with your specialists as you may have planned ° °Please go to the LAB at the blood drawing area for the tests to be done ° °You will be contacted by phone if any changes need to be made immediately.  Otherwise, you will receive a letter about your results with an explanation, but please check with MyChart first. ° °Please remember to sign up for MyChart if you have not done so, as this will be important to you in the future with finding out test results, communicating by private email, and scheduling acute appointments online when needed. ° °Please make an Appointment to return in 6 months, or sooner if needed, also with Lab Appointment for testing done 3-5 days before at the FIRST FLOOR Lab (so this is for TWO appointments - please see the scheduling desk as you leave) ° °Due to the ongoing Covid 19 pandemic, our lab now requires an appointment for any labs done at our office.  If you need labs done and do not have an appointment, please call our office ahead of time to schedule before presenting to the lab for your testing. ° ° °

## 2020-08-21 NOTE — Progress Notes (Signed)
Patient ID: Jennifer Waters, female   DOB: 1961/05/20, 60 y.o.   MRN: [id number]        Chief Complaint:: wellness exam and Follow-up  hyperglycemia, htn, hld, low vit d       HPI:  Jennifer Waters is a 60 y.o. female here for wellness exam; and up to date with preventive referrals and immunizations                        Also denies polydipsia, polyuria, Pt denies chest pain, increased sob or doe, wheezing, orthopnea, PND, increased LE swelling, palpitations, dizziness or syncope. Trying to follow low chol diet,  Tolerating new statin well.  Taking vit d. No new complaints    Wt Readings from Last 3 Encounters:  08/21/20 249 lb (112.9 kg)  07/24/20 245 lb (111.1 kg)  02/28/20 248 lb 8 oz (112.7 kg)   BP Readings from Last 3 Encounters:  08/21/20 124/82  08/15/20 118/68  07/24/20 (!) 160/100   Immunization History  Administered Date(s) Administered  . Influenza,inj,Quad PF,6+ Mos 05/01/2015, 05/04/2016, 02/25/2017, 02/17/2018, 03/07/2019, 02/28/2020  . PFIZER(Purple Top)SARS-COV-2 Vaccination 01/16/2020, 02/13/2020  . Tdap 06/03/2017  . Zoster Recombinat (Shingrix) 11/21/2019, 02/28/2020  There are no preventive care reminders to display for this patient.    Past Medical History:  Diagnosis Date  . Arthritis   . Depression   . Hypertension   . Migraines   . Obesity    Past Surgical History:  Procedure Laterality Date  . ABDOMINAL HYSTERECTOMY    . BREAST MASS EXCISION     benign done as a child  . TRANSTHORACIC ECHOCARDIOGRAM  12/2018    EF 55-60%.  Impaired relaxation but indeterminate filling pressures.  Normal RV size and function.  Normal valves.  Normal aortic root.    reports that she has been smoking cigarettes. She has a 10.00 pack-year smoking history. She has never used smokeless tobacco. She reports previous alcohol use of about 5.0 standard drinks of alcohol per week. She reports current drug use. Frequency: 1.00 time per week. Drug: Marijuana. family  history includes Arrhythmia in her sister; Arthritis in her mother and sister; Brain cancer in her brother; Diabetes in her brother; Edema in her sister; Lung cancer in her father; Migraines in her mother; Ovarian cancer in her mother. Allergies  Allergen Reactions  . Chantix [Varenicline] Nausea Only   Current Outpatient Medications on File Prior to Visit  Medication Sig Dispense Refill  . albuterol (VENTOLIN HFA) 108 (90 Base) MCG/ACT inhaler Inhale 2 puffs into the lungs every 6 (six) hours as needed for wheezing or shortness of breath. 6.7 g 2  . amLODipine (NORVASC) 10 MG tablet Take 1 tablet (10 mg total) by mouth daily. 90 tablet 3  . atorvastatin (LIPITOR) 10 MG tablet Take 1 tablet (10 mg total) by mouth daily. 90 tablet 3  . baclofen (LIORESAL) 10 MG tablet Take by mouth.    . citalopram (CELEXA) 10 MG tablet Take 10 mg by mouth daily.    . cyclobenzaprine (FLEXERIL) 5 MG tablet Take 1 tablet (5 mg total) by mouth 3 (three) times daily as needed. 90 tablet 1  . divalproex (DEPAKOTE) 500 MG DR tablet Take by mouth.    . gabapentin (NEURONTIN) 100 MG capsule Take 2 capsules (200 mg total) by mouth at bedtime. 180 capsule 1  . losartan-hydrochlorothiazide (HYZAAR) 50-12.5 MG tablet Take 1 tablet by mouth daily. 90 tablet 3  . omeprazole (  PRILOSEC) 20 MG capsule TAKE 1 CAPSULE BY MOUTH EVERY DAY 90 capsule 0  . venlafaxine XR (EFFEXOR XR) 150 MG 24 hr capsule Take 1 capsule (150 mg total) by mouth daily with breakfast. 90 capsule 3  . EMGALITY 120 MG/ML SOAJ Inject into the skin.    . SUMAtriptan (IMITREX) 100 MG tablet Take 1 tablet (100 mg total) by mouth every 2 (two) hours as needed for migraine or headache. May repeat in 2 hours if headache persists or recurs. (Patient not taking: Reported on 08/21/2020) 10 tablet 11  . traMADol (ULTRAM) 50 MG tablet Take 1 tablet (50 mg total) by mouth every 6 (six) hours as needed. (Patient not taking: Reported on 08/21/2020) 60 tablet 1   No current  facility-administered medications on file prior to visit.        ROS:  All others reviewed and negative.  Objective        PE:  BP 124/82   Pulse 90   Ht 5\' 9"  (1.753 m)   Wt 249 lb (112.9 kg)   SpO2 90%   BMI 36.77 kg/m                 Constitutional: Pt appears in NAD               HENT: Head: NCAT.                Right Ear: External ear normal.                 Left Ear: External ear normal.                Eyes: . Pupils are equal, round, and reactive to light. Conjunctivae and EOM are normal               Nose: without d/c or deformity               Neck: Neck supple. Gross normal ROM               Cardiovascular: Normal rate and regular rhythm.                 Pulmonary/Chest: Effort normal and breath sounds without rales or wheezing.                Abd:  Soft, NT, ND, + BS, no organomegaly               Neurological: Pt is alert. At baseline orientation, motor grossly intact               Skin: Skin is warm. No rashes, no other new lesions, LE edema - none               Psychiatric: Pt behavior is normal without agitation   Micro: none  Cardiac tracings I have personally interpreted today:  none  Pertinent Radiological findings (summarize): none   Lab Results  Component Value Date   WBC 8.9 08/21/2020   HGB 13.5 08/21/2020   HCT 40.0 08/21/2020   PLT 221.0 08/21/2020   GLUCOSE 171 (H) 08/21/2020   CHOL 133 08/21/2020   TRIG 103.0 08/21/2020   HDL 45.40 08/21/2020   LDLCALC 67 08/21/2020   ALT 19 08/21/2020   AST 16 08/21/2020   NA 139 08/21/2020   K 4.1 08/21/2020   CL 105 08/21/2020   CREATININE 0.82 08/21/2020   BUN 14 08/21/2020   CO2 30 08/21/2020   TSH 1.23  08/21/2020   HGBA1C 7.0 (H) 08/21/2020   Assessment/Plan:  Jennifer Waters is a 60 y.o. Black or African American [2] female with  has a past medical history of Arthritis, Depression, Hypertension, Migraines, and Obesity.  Preventative health care Age and sex appropriate education and  counseling updated with regular exercise and diet Referrals for preventative services - none needed Immunizations addressed - none needed Smoking counseling  - counseled to quit, not ready yet Evidence for depression or other mood disorder - none significant Most recent labs reviewed. I have personally reviewed and have noted: 1) the patient's medical and social history 2) The patient's current medications and supplements 3) The patient's height, Waters, and BMI have been recorded in the chart   Essential hypertension BP Readings from Last 3 Encounters:  08/21/20 124/82  08/15/20 118/68  07/24/20 (!) 160/100   Stable, pt to continue medical treatment norvasc 10   Hyperglycemia Lab Results  Component Value Date   HGBA1C 7.0 (H) 08/21/2020   Stable, pt to continue current medical treatment  - diet and wt control   Hyperlipidemia due to dietary fat intake Lab Results  Component Value Date   LDLCALC 67 08/21/2020   Stable, pt to continue current statin lipitor 10   Current Outpatient Medications (Cardiovascular):  .  amLODipine (NORVASC) 10 MG tablet, Take 1 tablet (10 mg total) by mouth daily. Marland Kitchen  atorvastatin (LIPITOR) 10 MG tablet, Take 1 tablet (10 mg total) by mouth daily. Marland Kitchen  losartan-hydrochlorothiazide (HYZAAR) 50-12.5 MG tablet, Take 1 tablet by mouth daily.  Current Outpatient Medications (Respiratory):  .  albuterol (VENTOLIN HFA) 108 (90 Base) MCG/ACT inhaler, Inhale 2 puffs into the lungs every 6 (six) hours as needed for wheezing or shortness of breath.  Current Outpatient Medications (Analgesics):  Marland Kitchen  EMGALITY 120 MG/ML SOAJ, Inject into the skin. .  SUMAtriptan (IMITREX) 100 MG tablet, Take 1 tablet (100 mg total) by mouth every 2 (two) hours as needed for migraine or headache. May repeat in 2 hours if headache persists or recurs. (Patient not taking: Reported on 08/21/2020) .  traMADol (ULTRAM) 50 MG tablet, Take 1 tablet (50 mg total) by mouth every 6 (six)  hours as needed. (Patient not taking: Reported on 08/21/2020)   Current Outpatient Medications (Other):  .  baclofen (LIORESAL) 10 MG tablet, Take by mouth. .  citalopram (CELEXA) 10 MG tablet, Take 10 mg by mouth daily. .  cyclobenzaprine (FLEXERIL) 5 MG tablet, Take 1 tablet (5 mg total) by mouth 3 (three) times daily as needed. .  divalproex (DEPAKOTE) 500 MG DR tablet, Take by mouth. .  gabapentin (NEURONTIN) 100 MG capsule, Take 2 capsules (200 mg total) by mouth at bedtime. Marland Kitchen  omeprazole (PRILOSEC) 20 MG capsule, TAKE 1 CAPSULE BY MOUTH EVERY DAY .  venlafaxine XR (EFFEXOR XR) 150 MG 24 hr capsule, Take 1 capsule (150 mg total) by mouth daily with breakfast.   Vitamin D deficiency Last vitamin D Lab Results  Component Value Date   VD25OH 31.97 08/21/2020   Stable, cont oral replacement   Followup: Return in about 6 months (around 02/21/2021).  Cathlean Cower, MD 08/27/2020 7:45 AM [facility]'

## 2020-08-27 ENCOUNTER — Encounter: Payer: Self-pay | Admitting: Internal Medicine

## 2020-08-27 NOTE — Assessment & Plan Note (Signed)
Lab Results  Component Value Date   HGBA1C 7.0 (H) 08/21/2020   Stable, pt to continue current medical treatment  - diet and wt control

## 2020-08-27 NOTE — Assessment & Plan Note (Signed)
Last vitamin D Lab Results  Component Value Date   VD25OH 31.97 08/21/2020   Stable, cont oral replacement

## 2020-08-27 NOTE — Assessment & Plan Note (Addendum)
Age and sex appropriate education and counseling updated with regular exercise and diet Referrals for preventative services - none needed Immunizations addressed - none needed Smoking counseling  - counseled to quit, not ready yet Evidence for depression or other mood disorder - none significant Most recent labs reviewed. I have personally reviewed and have noted: 1) the patient's medical and social history 2) The patient's current medications and supplements 3) The patient's height, weight, and BMI have been recorded in the chart

## 2020-08-27 NOTE — Assessment & Plan Note (Signed)
Lab Results  Component Value Date   LDLCALC 67 08/21/2020   Stable, pt to continue current statin lipitor 10   Current Outpatient Medications (Cardiovascular):  .  amLODipine (NORVASC) 10 MG tablet, Take 1 tablet (10 mg total) by mouth daily. Marland Kitchen  atorvastatin (LIPITOR) 10 MG tablet, Take 1 tablet (10 mg total) by mouth daily. Marland Kitchen  losartan-hydrochlorothiazide (HYZAAR) 50-12.5 MG tablet, Take 1 tablet by mouth daily.  Current Outpatient Medications (Respiratory):  .  albuterol (VENTOLIN HFA) 108 (90 Base) MCG/ACT inhaler, Inhale 2 puffs into the lungs every 6 (six) hours as needed for wheezing or shortness of breath.  Current Outpatient Medications (Analgesics):  Marland Kitchen  EMGALITY 120 MG/ML SOAJ, Inject into the skin. .  SUMAtriptan (IMITREX) 100 MG tablet, Take 1 tablet (100 mg total) by mouth every 2 (two) hours as needed for migraine or headache. May repeat in 2 hours if headache persists or recurs. (Patient not taking: Reported on 08/21/2020) .  traMADol (ULTRAM) 50 MG tablet, Take 1 tablet (50 mg total) by mouth every 6 (six) hours as needed. (Patient not taking: Reported on 08/21/2020)   Current Outpatient Medications (Other):  .  baclofen (LIORESAL) 10 MG tablet, Take by mouth. .  citalopram (CELEXA) 10 MG tablet, Take 10 mg by mouth daily. .  cyclobenzaprine (FLEXERIL) 5 MG tablet, Take 1 tablet (5 mg total) by mouth 3 (three) times daily as needed. .  divalproex (DEPAKOTE) 500 MG DR tablet, Take by mouth. .  gabapentin (NEURONTIN) 100 MG capsule, Take 2 capsules (200 mg total) by mouth at bedtime. Marland Kitchen  omeprazole (PRILOSEC) 20 MG capsule, TAKE 1 CAPSULE BY MOUTH EVERY DAY .  venlafaxine XR (EFFEXOR XR) 150 MG 24 hr capsule, Take 1 capsule (150 mg total) by mouth daily with breakfast.

## 2020-08-27 NOTE — Assessment & Plan Note (Signed)
BP Readings from Last 3 Encounters:  08/21/20 124/82  08/15/20 118/68  07/24/20 (!) 160/100   Stable, pt to continue medical treatment norvasc 10

## 2020-09-02 ENCOUNTER — Telehealth: Payer: Self-pay | Admitting: Internal Medicine

## 2020-09-02 NOTE — Telephone Encounter (Signed)
  venlafaxine XR (EFFEXOR XR) 150 MG 24 hr capsule [place] [place] [facility] [place] Phone:  [phone]  Fax:  [phone]     Patient requesting a new script be sent to her new pharmacy Last seen- 03.03.22 Next apt- n/a

## 2020-09-05 NOTE — Telephone Encounter (Signed)
We should stay off effexor if she is taking the celexa for now

## 2020-09-05 NOTE — Telephone Encounter (Signed)
Patient calling for status of refill for Effexor

## 2020-09-08 NOTE — Telephone Encounter (Signed)
Patient states she does not know what celexa is and has never taken it, she request that we send in the effexor for her

## 2020-09-08 NOTE — Telephone Encounter (Signed)
Notified patient via voicemail that the refill request for effexor has been denied and to coutinue to take celexa.

## 2020-09-10 MED ORDER — VENLAFAXINE HCL ER 150 MG PO CP24: 150.0000 mg | ORAL_CAPSULE | Freq: Every day | ORAL | 3 refills | Status: DC

## 2020-09-10 NOTE — Addendum Note (Signed)
Addended by: Biagio Borg on: 09/10/2020 12:24 PM   Modules accepted: Orders

## 2020-09-10 NOTE — Telephone Encounter (Signed)
Ok this is done 

## 2020-09-25 ENCOUNTER — Other Ambulatory Visit: Payer: Self-pay

## 2020-09-25 ENCOUNTER — Ambulatory Visit
Admission: EM | Admit: 2020-09-25 | Discharge: 2020-09-25 | Disposition: A | Discharge status: Home / Self Care | Payer: 59 | Attending: Emergency Medicine | Admitting: Emergency Medicine

## 2020-09-25 ENCOUNTER — Ambulatory Visit (INDEPENDENT_AMBULATORY_CARE_PROVIDER_SITE_OTHER): Payer: 59

## 2020-09-25 DIAGNOSIS — M25552 Pain in left hip: Secondary | ICD-10-CM

## 2020-09-25 DIAGNOSIS — S7002XA Contusion of left hip, initial encounter: Secondary | ICD-10-CM

## 2020-09-25 MED ORDER — NAPROXEN 500 MG PO TABS: 500.0000 mg | ORAL_TABLET | Freq: Two times a day (BID) | ORAL | 0 refills | Status: DC

## 2020-09-25 NOTE — Discharge Instructions (Addendum)
No fracture Naprosyn twice daily for pain/swelling or may use Tylenol/ibuprofen Alternate ice and heat Follow-up if not improving or worsening

## 2020-09-25 NOTE — ED Provider Notes (Signed)
[facility]    CSN: [id number] Arrival date & time: 09/25/20  1346      History   Chief Complaint Chief Complaint  Patient presents with  . Fall  . Hip Pain    HPI Jennifer Waters is a 60 y.o. female history of hypertension, migraines, presenting today for evaluation of hip pain.  Patient reports approximately 2 days ago she fell and hit her hip on the edge of the shower.  Since she has had sensitivity pain swelling and bruising to her left hip.  She also initially had pain in her left elbow and left foot, but these are much improved.  Her main concern is her hip.  She reports that fall was triggered by a coughing spell which caused her to become lightheaded, she reports remembering the incident but could not react quick enough to catch herself.  She reports similar episodes of syncope/presyncope approximately 3 times previously all related to coughing/choking spells.  She denies any chest pain or shortness of breath.  She denies any headaches, vision changes, difficulty speaking or weakness.  HPI  Past Medical History:  Diagnosis Date  . Arthritis   . Depression   . Hypertension   . Migraines   . Obesity     Patient Active Problem List   Diagnosis Date Noted  . Hyperlipidemia due to dietary fat intake 08/03/2020  . Chronic headache 03/01/2020  . Asthma 12/23/2019  . Muscle cramping 03/07/2019  . Scapular dyskinesis 02/21/2019  . Patellofemoral arthritis of right knee 01/22/2019  . Right shoulder pain 12/22/2018  . Vitamin D deficiency 12/20/2018  . Cervical radiculopathy at C6 12/20/2018  . Hyperglycemia 12/20/2018  . Syncope 12/20/2018  . Obesity (BMI 35.0-39.9 without comorbidity) 03/10/2018  . Nonallopathic lesion of cervical region 12/02/2017  . Nonallopathic lesion of thoracic region 12/02/2017  . Nonallopathic lesion of rib cage 12/02/2017  . Preventative health care 11/04/2017  . Tobacco abuse 07/08/2017  . Anxiety and depression 11/22/2016  .  Cervical disc disorder with radiculopathy of cervical region 05/04/2016  . Cough 03/15/2016  . Snoring 12/11/2015  . GERD (gastroesophageal reflux disease) 12/11/2015  . Allergic rhinitis 05/01/2015  . Osteoarthritis 10/17/2014  . Essential hypertension 10/17/2014    Past Surgical History:  Procedure Laterality Date  . ABDOMINAL HYSTERECTOMY    . BREAST MASS EXCISION     benign done as a child  . TRANSTHORACIC ECHOCARDIOGRAM  12/2018    EF 55-60%.  Impaired relaxation but indeterminate filling pressures.  Normal RV size and function.  Normal valves.  Normal aortic root.    OB History   No obstetric history on file.      Home Medications    Prior to Admission medications   Medication Sig Start Date End Date Taking? Authorizing Provider  naproxen (NAPROSYN) 500 MG tablet Take 1 tablet (500 mg total) by mouth 2 (two) times daily. 09/25/20  Yes Jie Stickels C, PA-C  albuterol (VENTOLIN HFA) 108 (90 Base) MCG/ACT inhaler Inhale 2 puffs into the lungs every 6 (six) hours as needed for wheezing or shortness of breath. 11/21/19   Marrian Salvage, FNP  amLODipine (NORVASC) 10 MG tablet Take 1 tablet (10 mg total) by mouth daily. 07/24/20   Leonie Man, MD  atorvastatin (LIPITOR) 10 MG tablet Take 1 tablet (10 mg total) by mouth daily. 07/24/20 10/22/20  Leonie Man, MD  baclofen (LIORESAL) 10 MG tablet Take by mouth. 08/07/20 08/07/21  [provider]  cyclobenzaprine (FLEXERIL)  5 MG tablet Take 1 tablet (5 mg total) by mouth 3 (three) times daily as needed. 06/18/20   Biagio Borg, MD  divalproex (DEPAKOTE) 500 MG DR tablet Take by mouth. 08/07/20   [provider]  EMGALITY 120 MG/ML SOAJ Inject into the skin. 08/14/20   [provider]  gabapentin (NEURONTIN) 100 MG capsule Take 2 capsules (200 mg total) by mouth at bedtime. 02/28/20   Biagio Borg, MD  losartan-hydrochlorothiazide (HYZAAR) 50-12.5 MG tablet Take 1 tablet by mouth daily. 07/24/20    Leonie Man, MD  omeprazole (PRILOSEC) 20 MG capsule TAKE 1 CAPSULE BY MOUTH EVERY DAY 06/23/17   Lance Sell, NP  SUMAtriptan (IMITREX) 100 MG tablet Take 1 tablet (100 mg total) by mouth every 2 (two) hours as needed for migraine or headache. May repeat in 2 hours if headache persists or recurs. Patient not taking: Reported on 08/21/2020 02/28/20   Biagio Borg, MD  traMADol (ULTRAM) 50 MG tablet Take 1 tablet (50 mg total) by mouth every 6 (six) hours as needed. Patient not taking: Reported on 08/21/2020 02/28/20   Biagio Borg, MD  venlafaxine XR (EFFEXOR XR) 150 MG 24 hr capsule Take 1 capsule (150 mg total) by mouth daily with breakfast. 09/10/20   Biagio Borg, MD    Family History Family History  Problem Relation Age of Onset  . Arthritis Mother   . Ovarian cancer Mother   . Migraines Mother   . Lung cancer Father   . Arrhythmia Sister   . Brain cancer Brother   . Edema Sister   . Arthritis Sister   . Diabetes Brother   . Colon polyps Neg Hx   . Colon cancer Neg Hx   . Esophageal cancer Neg Hx   . Rectal cancer Neg Hx   . Stomach cancer Neg Hx     Social History Social History   Tobacco Use  . Smoking status: Current Every Day Smoker    Packs/day: 0.50    Years: 20.00    Pack years: 10.00    Types: Cigarettes  . Smokeless tobacco: Never Used  Vaping Use  . Vaping Use: Never used  Substance Use Topics  . Alcohol use: Not Currently    Alcohol/week: 5.0 standard drinks    Types: 5 Glasses of wine per week  . Drug use: Yes    Frequency: 1.0 times per week    Types: Marijuana     Allergies   Chantix [varenicline]   Review of Systems Review of Systems  Constitutional: Negative for fatigue and fever.  HENT: Negative for congestion, sinus pressure and sore throat.   Eyes: Negative for photophobia, pain and visual disturbance.  Respiratory: Negative for cough and shortness of breath.   Cardiovascular: Negative for chest pain.  Gastrointestinal:  Negative for abdominal pain, nausea and vomiting.  Genitourinary: Negative for decreased urine volume and hematuria.  Musculoskeletal: Positive for arthralgias, gait problem and myalgias. Negative for neck pain and neck stiffness.  Neurological: Negative for dizziness, syncope, facial asymmetry, speech difficulty, weakness, light-headedness, numbness and headaches.     Physical Exam Triage Vital Signs ED Triage Vitals  Enc Vitals Group     BP 09/25/20 1406 127/84     Pulse Rate 09/25/20 1406 92     Resp 09/25/20 1406 18     Temp 09/25/20 1406 98.3 F (36.8 C)     Temp Source 09/25/20 1406 Oral     SpO2 09/25/20 1406  97 %     Weight --      Height --      Head Circumference --      Peak Flow --      Pain Score 09/25/20 1407 4     Pain Loc --      Pain Edu? --      Excl. in [place]? --    No data found.  Updated Vital Signs BP 127/84 (BP Location: Left Arm)   Pulse 92   Temp 98.3 F (36.8 C) (Oral)   Resp 18   SpO2 97%   Visual Acuity Right Eye Distance:   Left Eye Distance:   Bilateral Distance:    Right Eye Near:   Left Eye Near:    Bilateral Near:     Physical Exam Vitals and nursing note reviewed.  Constitutional:      Appearance: She is well-developed.     Comments: No acute distress  HENT:     Head: Normocephalic and atraumatic.     Nose: Nose normal.  Eyes:     Extraocular Movements: Extraocular movements intact.     Conjunctiva/sclera: Conjunctivae normal.     Pupils: Pupils are equal, round, and reactive to light.  Cardiovascular:     Rate and Rhythm: Normal rate.  Pulmonary:     Effort: Pulmonary effort is normal. No respiratory distress.  Abdominal:     General: There is no distension.  Musculoskeletal:        General: Normal range of motion.     Cervical back: Neck supple.     Comments: Back/hip: Nontender palpation of thoracic and lumbar spine midline, nontender to palpation to bilateral lumbar areas or into superior gluteal/sacral areas of  back, tenderness to palpation along left lateral hip extending into proximal lateral thigh  Gait with mild antalgia  Skin:    General: Skin is warm and dry.     Comments: Large purple ecchymosis noted to lateral aspect of proximal thigh  Neurological:     Mental Status: She is alert and oriented to person, place, and time.      UC Treatments / Results  Labs (all labs ordered are listed, but only abnormal results are displayed) Labs Reviewed - No data to display  EKG   Radiology DG Hip Unilat With Pelvis 2-3 Views Left  Result Date: 09/25/2020 CLINICAL DATA:  Fall 2 days ago.  Left hip pain EXAM: DG HIP (WITH OR WITHOUT PELVIS) 2-3V LEFT COMPARISON:  None. FINDINGS: There is no evidence of hip fracture or dislocation. There is no evidence of arthropathy or other focal bone abnormality. IMPRESSION: Negative. Electronically Signed   By: Franchot Gallo M.D.   On: 09/25/2020 14:57    Procedures Procedures (including critical care time)  Medications Ordered in UC Medications - No data to display  Initial Impression / Assessment and Plan / UC Course  I have reviewed the triage vital signs and the nursing notes.  Pertinent labs & imaging results that were available during my care of the patient were reviewed by me and considered in my medical decision making (see chart for details).     X-ray negative for acute fracture, suspect likely contusion/hematoma to left hip.  Recommending ice heat and anti-inflammatories.  Would expect spontaneous gradual resolution.  Continue to monitor.  Discussed strict return precautions. Patient verbalized understanding and is agreeable with plan.  Final Clinical Impressions(s) / UC Diagnoses   Final diagnoses:  Acute hip pain, left  Contusion of  left hip, initial encounter     Discharge Instructions     No fracture Naprosyn twice daily for pain/swelling or may use Tylenol/ibuprofen Alternate ice and heat Follow-up if not improving or  worsening    ED Prescriptions    Medication Sig Dispense Auth. Provider   naproxen (NAPROSYN) 500 MG tablet Take 1 tablet (500 mg total) by mouth 2 (two) times daily. 30 tablet [place]on Mergenthaler, [place] C, PA-C     PDMP not reviewed this encounter.   Janith Lima, PA-C 09/25/20 1511

## 2020-09-25 NOTE — ED Triage Notes (Signed)
Pt states 2 days ago was coughing hard and passed out and fell hitting her lt hip, lt elbow, and lt foot pain. States her elbow and foot feels better but lt hip is worsen in pain. States has passed out d/t coughing or choking in the past.

## 2020-10-07 ENCOUNTER — Ambulatory Visit: Payer: 59

## 2020-10-07 NOTE — Progress Notes (Deleted)
10/07/2020 Jennifer Waters 1960/07/28 [id number]   HPI:  Jennifer Waters is a 60 y.o. female patient of Dr Ellyn Hack, with a PMH below who presents today for hypertension clinic evaluation.  He saw her in the office earlier this month, at which time her pressure was noted to be 160/100.  She was started on amlodipine 10 mg and losartan/hctz 50/12.5 mg, both once daily.  Patient notes that she has had hypertension since she was in her 75's.  It was noted when she developed migraines, and treated by that doctor for several years before her PCP took over.  Currently patient has [facility], which has a deductible of almost $5,000 for pharmacy.  Because of that Dr. Ellyn Hack chose medications that were available on the [facility] $4 list. Patient was appreciative of the low costs.  She currently lives with daughter and her significant other as well as their newborn son.    Today she returns for follow up.  She has a blood pressure cuff at home, although admits she hasn't checked her readings since seeing Dr. Ellyn Hack.  She also needs to repeat metabolic panel today, now that she has been on losartan/hctz for 3 weeks.  She has had no side effects or concerns about the medications.    At her last visit blood pressure was well controlled at 118/68 and medications were left unchanged.  She was asked to monitor home readings 3-4 times each week and bring that information to her next visit so we could determine if pressure is doing well consistently.  Metabolic panel drawn at that time showed kidney function and electrolytes were stable.   Since then a full panel of blood work was drawn by her primary MD, showing TSH, CBC, liver and lipid labs all WNL.    Past Medical History: hyperlipidemia LDL - now on atorvastatin 10 mg qd  Tobacco abouse Counseled by MD at last visit, will offer Health Coach  migrtaines Uses sumatriptan, tramadol prn  Anxiety/depression On citalopram, venlafaxine    Blood  Pressure Goal:  130/80  Current Medications: amlodipine 10 mg qd, losartan hctz 50/12.5 mg qd - all in am  Family Hx: both deceased, father died at 60 from emphysema (when she was 70), mother died from CHF at 81; emphysema at 50 dad; has 3 siblings, all 81-56 years older than her; oldest sister with PPM, brother with DM, toes amputated, other sis with lymphadema  Social Hx: occasional smoker, not in house with baby (3 months); occasional wine (about once monthly); coffee in am at home drinks 2-3 cups per day, otherwise caffeine free  Diet: mostly home cooked meals; plenty of salads, broccoli, asparagus; bought som slim fast to help loose weight  Exercise: none regularly  Home BP readings: none with her today  Intolerances: chantix - caused nausea  Labs: 07/24/20:  Na 145, K 4.7, Glu 131, BUN 12, SCr 0.92 GFR 79   TC 196, TG 111, HDL 53, LDL 123   Wt Readings from Last 3 Encounters:  08/21/20 249 lb (112.9 kg)  07/24/20 245 lb (111.1 kg)  02/28/20 248 lb 8 oz (112.7 kg)   BP Readings from Last 3 Encounters:  09/25/20 127/84  08/21/20 124/82  08/15/20 118/68   Pulse Readings from Last 3 Encounters:  09/25/20 92  08/21/20 90  08/15/20 82    Current Outpatient Medications  Medication Sig Dispense Refill  . albuterol (VENTOLIN HFA) 108 (90 Base) MCG/ACT inhaler Inhale 2 puffs  into the lungs every 6 (six) hours as needed for wheezing or shortness of breath. 6.7 g 2  . amLODipine (NORVASC) 10 MG tablet Take 1 tablet (10 mg total) by mouth daily. 90 tablet 3  . atorvastatin (LIPITOR) 10 MG tablet Take 1 tablet (10 mg total) by mouth daily. 90 tablet 3  . baclofen (LIORESAL) 10 MG tablet Take by mouth.    . cyclobenzaprine (FLEXERIL) 5 MG tablet Take 1 tablet (5 mg total) by mouth 3 (three) times daily as needed. 90 tablet 1  . divalproex (DEPAKOTE) 500 MG DR tablet Take by mouth.    . EMGALITY 120 MG/ML SOAJ Inject into the skin.    Marland Kitchen gabapentin (NEURONTIN) 100 MG capsule Take 2  capsules (200 mg total) by mouth at bedtime. 180 capsule 1  . losartan-hydrochlorothiazide (HYZAAR) 50-12.5 MG tablet Take 1 tablet by mouth daily. 90 tablet 3  . naproxen (NAPROSYN) 500 MG tablet Take 1 tablet (500 mg total) by mouth 2 (two) times daily. 30 tablet 0  . omeprazole (PRILOSEC) 20 MG capsule TAKE 1 CAPSULE BY MOUTH EVERY DAY 90 capsule 0  . SUMAtriptan (IMITREX) 100 MG tablet Take 1 tablet (100 mg total) by mouth every 2 (two) hours as needed for migraine or headache. May repeat in 2 hours if headache persists or recurs. (Patient not taking: Reported on 08/21/2020) 10 tablet 11  . traMADol (ULTRAM) 50 MG tablet Take 1 tablet (50 mg total) by mouth every 6 (six) hours as needed. (Patient not taking: Reported on 08/21/2020) 60 tablet 1  . venlafaxine XR (EFFEXOR XR) 150 MG 24 hr capsule Take 1 capsule (150 mg total) by mouth daily with breakfast. 90 capsule 3   No current facility-administered medications for this visit.    Allergies  Allergen Reactions  . Chantix [Varenicline] Nausea Only    Past Medical History:  Diagnosis Date  . Arthritis   . Depression   . Hypertension   . Migraines   . Obesity     There were no vitals taken for this visit.  No problem-specific Assessment & Plan notes found for this encounter.   Tommy Medal PharmD CPP [facility] [address] [place], [place] [address] [phone]

## 2020-10-08 ENCOUNTER — Telehealth: Payer: Self-pay

## 2020-10-08 NOTE — Telephone Encounter (Signed)
lmom for missed visit

## 2020-10-22 ENCOUNTER — Other Ambulatory Visit: Payer: Self-pay | Admitting: Internal Medicine

## 2020-10-29 NOTE — Progress Notes (Signed)
Jennifer Waters is a 60 y.o. female who presents to [place] [facility] today for chronic B knee pain and swelling.  She was last seen by Dr. Tamala Julian on 02/21/19 for neck pain and prior to that on 01/22/19 for R knee pain.  Since then, pt reports knee pain and swelling x year, but exacerbated last weekend.  She locates her pain to the anterior aspect of bilat knees.   Radiating pain: R- proximally Knee swelling: L-yes Knee mechanical symptoms: yes Aggravating factors: transitioning from sitting to standing Treatments tried: prior R knee injection on 01/22/19, IBU  Diagnostic imaging: 09/25/20 L hip/pelvis XR  B knee XR- 01/22/19  Also, pt suffered a fall on 09/23/20 landing on L side. Pt was seen in [facility] on 09/25/20 c/o L hip pain. Pt c/o having a "knot" remaining on the lateral aspect of L hip. Pt c/o not being able to lay on L side.  Pertinent review of systems: No fevers or chills  Relevant historical information: Hypertension   Exam:  BP 136/90   Pulse 99   Ht 5\' 9"  (1.753 m)   Wt 248 lb (112.5 kg)   SpO2 94%   BMI 36.62 kg/m  General: Well Developed, well nourished, and in no acute distress.   MSK: Left hip large soft mass lateral hip minimally tender to palpation.  Normal hip motion.  Left knee moderate effusion normal-appearing otherwise.  Normal motion.  Stable ligamentous exam.  Mildly tender palpation medial and lateral joint line.  Right knee minimal effusion normal motion stable ligamentous exam mildly tender palpation medial joint line.    Lab and Radiology Results EXAM: DG HIP (WITH OR WITHOUT PELVIS) 2-3V LEFT  COMPARISON:  None.  FINDINGS: There is no evidence of hip fracture or dislocation. There is no evidence of arthropathy or other focal bone abnormality.  IMPRESSION: Negative.   Electronically Signed   By: Franchot Gallo M.D.   On: 09/25/2020 14:57  EXAM: BILATERAL KNEES STANDING - 1 VIEW  COMPARISON:  No  pertinent prior studies available for comparison.  FINDINGS: No evidence of fracture on this single view radiograph. There is mild-to-moderate bilateral medial and lateral tibiofemoral compartment joint space narrowing, greater on the right. Additionally, there is mild medial compartment subchondral sclerosis along the tibial plateau bilaterally. The soft tissues are grossly unremarkable.  IMPRESSION: Mild-to-moderate bilateral knee osteoarthrosis, as described.   Electronically Signed   By: Kellie Simmering   On: 01/22/2019 17:05   I, Lynne Leader, personally (independently) visualized and performed the interpretation of the images attached in this note.   Procedure: Real-time Ultrasound Guided aspiration and injection of large left lateral hip seroma Device: Philips Affiniti 50G Images permanently stored and available for review in PACS Ultrasound evaluation prior to injection reveals very large seroma visible at lateral hip left side Verbal informed consent obtained.  Discussed risks and benefits of procedure. Warned about infection bleeding damage to structures skin hypopigmentation and fat atrophy among others. Patient expresses understanding and agreement Time-out conducted.   Noted no overlying erythema, induration, or other signs of local infection.   Skin prepped in a sterile fashion.   Local anesthesia: Topical Ethyl chloride.   With sterile technique and under real time ultrasound guidance:  3 mL of lidocaine injected into the cutaneous tissue.    Skin sterilized with isopropyl alcohol and an 18-gauge spinal needle used to access the seroma. 150 mL of dark bloody fluid aspirated. Seroma seen decompressed  after the aspiration. Syringe exchanged and 40 mg of Kenalog and 2 mL of Marcaine injected . Fluid seen entering the cerumen space.   Completed without difficulty   Pain immediately resolved suggesting accurate placement of the medication.   Advised to call if  fevers/chills, erythema, induration, drainage, or persistent bleeding.   Images permanently stored and available for review in the ultrasound unit.  Impression: Technically successful ultrasound guided injection.     Procedure: Real-time Ultrasound Guided Injection of left knee superior lateral patellar space Device: Philips Affiniti 50G Images permanently stored and available for review in PACS Verbal informed consent obtained.  Discussed risks and benefits of procedure. Warned about infection bleeding damage to structures skin hypopigmentation and fat atrophy among others. Patient expresses understanding and agreement Time-out conducted.   Noted no overlying erythema, induration, or other signs of local infection.   Skin prepped in a sterile fashion.   Local anesthesia: Topical Ethyl chloride.   With sterile technique and under real time ultrasound guidance:  40 mg of Kenalog and 2 mL of Marcaine injected into the joint. Fluid seen entering the joint capsule.   Completed without difficulty   Pain immediately resolved suggesting accurate placement of the medication.   Advised to call if fevers/chills, erythema, induration, drainage, or persistent bleeding.   Images permanently stored and available for review in the ultrasound unit.  Impression: Technically successful ultrasound guided injection.    Assessment and Plan: 60 y.o. female with left seroma lateral hip.  Successfully aspirated and injected today.  Plan for compression and recheck back in about a month.  Left knee pain due to DJD exacerbation plan for injection.  Also recommend Voltaren gel.  Right knee much less of an issue.  Can follow-up this issue in 1 more month if needed.  Recommend Voltaren gel for now.   PDMP not reviewed this encounter. Orders Placed This Encounter  Procedures  . [name] LIMITED JOINT SPACE STRUCTURES LOW BILAT(NO LINKED CHARGES)    Standing Status:   Future    Number of Occurrences:   1     Standing Expiration Date:   05/02/2021    Order Specific Question:   Reason for Exam (SYMPTOM  OR DIAGNOSIS REQUIRED)    Answer:   chronic bilateral knee pain    Order Specific Question:   Preferred imaging location?    Answer:   [facility]   No orders of the defined types were placed in this encounter.    Discussed warning signs or symptoms. Please see discharge instructions. Patient expresses understanding.   The above documentation has been reviewed and is accurate and complete Lynne Leader, M.D.

## 2020-10-30 ENCOUNTER — Ambulatory Visit (INDEPENDENT_AMBULATORY_CARE_PROVIDER_SITE_OTHER): Payer: 59 | Admitting: Family Medicine

## 2020-10-30 ENCOUNTER — Ambulatory Visit: Payer: Self-pay

## 2020-10-30 ENCOUNTER — Other Ambulatory Visit: Payer: Self-pay

## 2020-10-30 VITALS — BP 136/90 | HR 99 | Ht 69.0 in | Wt 248.0 lb

## 2020-10-30 DIAGNOSIS — M25561 Pain in right knee: Secondary | ICD-10-CM | POA: Diagnosis not present

## 2020-10-30 DIAGNOSIS — M25562 Pain in left knee: Secondary | ICD-10-CM

## 2020-10-30 DIAGNOSIS — G8929 Other chronic pain: Secondary | ICD-10-CM | POA: Diagnosis not present

## 2020-10-30 DIAGNOSIS — T792XXA Traumatic secondary and recurrent hemorrhage and seroma, initial encounter: Secondary | ICD-10-CM

## 2020-10-30 NOTE — Patient Instructions (Signed)
Thank you for coming in today.  Recheck in about 1 month with Dr Tamala Julian or myself.   Try using compression shorts.   Please use Voltaren gel (Generic Diclofenac Gel) up to 4x daily for pain as needed.  This is available over-the-counter as both the name brand Voltaren gel and the generic diclofenac gel.   Seroma A seroma is a collection of fluid on the body that looks like swelling or a mass. Seromas form where tissue has been injured or cut. Seromas vary in size. Some are small and painless. Others may become large and cause pain or discomfort. Many seromas go away on their own as the fluid is naturally absorbed by the body, and some seromas need to be drained. What are the causes? Seromas form as the result of damage to tissue or the removal of tissue. This tissue damage may happen during surgery or because of an injury or trauma. When tissue is disrupted or removed, empty space is created. The body's natural defense system (immune system) causes fluid to enter the empty space and form a seroma. What are the signs or symptoms? Symptoms of this condition include:  Swelling at the site of a surgical incision or an injury.  Drainage of clear fluid at the surgery or injury site.  Discomfort or pain. How is this diagnosed? This condition is diagnosed based on:  Your symptoms.  Your medical history.  A physical exam. During the exam, your health care provider will press on the seroma. You may also have tests, including:  Blood tests.  Imaging tests, such as an ultrasound or a CT scan. How is this treated? Some seromas go away on their own. Your health care provider may monitor you to make sure the seroma does not cause any problems. If your seroma does not go away on its own, treatment may include:  Using a needle to drain the fluid from the seroma (needle aspiration).  Inserting a small, thin tube (catheter) to drain the fluid.  Applying a bandage (dressing), such as an elastic  bandage or binder.  Taking antibiotic medicines, if the seroma becomes infected. In rare cases, surgery may be done to remove the seroma and repair the area. Follow these instructions at home:  If you were prescribed an antibiotic medicine, take it as told by your health care provider. Do not stop using the antibiotic even if you start to feel better.  Take over-the-counter and prescription medicines only as told by your health care provider.  Return to your normal activities as told by your health care provider. Ask your health care provider what activities are safe for you.  Check your seroma every day for signs of infection. Check for: ? Redness or pain. ? More swelling. ? More fluid. ? Warmth. ? Pus or a bad smell.  Keep all follow-up visits as told by your health care provider. This is important.   Contact a health care provider if:  You have a fever.  You have redness or pain at the site of the seroma.  Your seroma is more swollen or is getting bigger.  You have more fluid coming from the seroma.  Your seroma feels warm to the touch.  You have pus or a bad smell coming from the seroma. Get help right away if:  You have a fever along with severe pain, redness at the site, or chills.  You feel confused or have trouble staying awake.  You feel like your heart is beating very  fast.  You feel short of breath or are breathing rapidly.  You have cool, clammy, or sweaty skin. Summary  Seromas can form as a result of injury or surgery on tissue.  Seromas can cause swelling, drainage of clear fluid, and discomfort or pain at the site of the tissue injury.  Some seromas go away on their own. Other seromas may need to be drained.  Check your seroma every day for signs of infection, such as pain, more swelling, more fluid, warmth, or pus or a bad smell. This information is not intended to replace advice given to you by your health care provider. Make sure you discuss any  questions you have with your health care provider. Document Revised: 04/30/2019 Document Reviewed: 04/30/2019 Elsevier Patient Education  2021 [employer].

## 2020-11-21 ENCOUNTER — Other Ambulatory Visit: Payer: Self-pay | Admitting: Internal Medicine

## 2020-11-30 NOTE — Progress Notes (Signed)
   Rito Ehrlich, am serving as a Education administrator for Dr. Lynne Leader. Jennifer Waters is a 60 y.o. female who presents to [place] [facility] today for f/u chronic bilat knee pain and L hip seroma. Pt suffered a fall on 09/23/20 landing on L side. Pt was last seen by Dr. Georgina Snell on 10/30/20 and had L hip seroma aspirated and injected and had a L knee steroid injection. Pt was advised to compress hip area and use Voltaren gel on her knees. Today, pt reports L knee did great after injection for 3 weeks. Last week the pain was really bad had to use a cane to walk, that lasted three days. Both knees are very sore when going from sitting position to standing and standing to sitting. Has been holding her 64 month old 22LB baby and felt like they could give way. Today she states they feel okay.   Dx imaging: 09/25/20 L hip XR  01/22/20 Bilat knee XR  Pertinent review of systems: No fevers or chills  Relevant historical information: hypertension.  Recent seroma.  No history of gout.   Exam:  BP 122/82 (BP Location: Left Arm, Patient Position: Sitting, Cuff Size: Normal)   Pulse 98   Ht 5\' 9"  (1.753 m)   Wt 243 lb (110.2 kg)   SpO2 96%   BMI 35.88 kg/m  General: Well Developed, well nourished, and in no acute distress.   MSK: Left knee normal.  Normal motion minimal crepitation.  Nontender.    Lab and Radiology Results  X-ray images left knee obtained today personally and independently interpreted Moderate medial compartment DJD.  Moderate patellofemoral DJD.  Osteophyte superior portion of patella.  No acute fractures. Await formal radiology review    Assessment and Plan: 60 y.o. female with left knee pain due to DJD.  Given the transitory nature of her pain is possible that she has gout.  Plan to check uric acid.  I do not believe it has ever been checked before.  She is doing better now.  So we will consider compression sleeve and Voltaren gel as needed.  Await results  of uric acid.  Check back as needed.    Seroma lateral hip has resolved.   PDMP not reviewed this encounter. Orders Placed This Encounter  Procedures   DG Knee AP/LAT W/Sunrise Left    Standing Status:   Future    Number of Occurrences:   1    Standing Expiration Date:   12/01/2021    Order Specific Question:   Reason for Exam (SYMPTOM  OR DIAGNOSIS REQUIRED)    Answer:   eval left knee pain    Order Specific Question:   Is patient pregnant?    Answer:   No    Order Specific Question:   Preferred imaging location?    Answer:   [facility]   Uric acid    Standing Status:   Future    Number of Occurrences:   1    Standing Expiration Date:   12/01/2021   No orders of the defined types were placed in this encounter.    Discussed warning signs or symptoms. Please see discharge instructions. Patient expresses understanding.   The above documentation has been reviewed and is accurate and complete Lynne Leader, M.D.

## 2020-12-01 ENCOUNTER — Ambulatory Visit (INDEPENDENT_AMBULATORY_CARE_PROVIDER_SITE_OTHER): Payer: 59

## 2020-12-01 ENCOUNTER — Ambulatory Visit (INDEPENDENT_AMBULATORY_CARE_PROVIDER_SITE_OTHER): Payer: 59 | Admitting: Family Medicine

## 2020-12-01 ENCOUNTER — Encounter: Payer: Self-pay | Admitting: Family Medicine

## 2020-12-01 ENCOUNTER — Other Ambulatory Visit: Payer: Self-pay

## 2020-12-01 VITALS — BP 122/82 | HR 98 | Ht 69.0 in | Wt 243.0 lb

## 2020-12-01 DIAGNOSIS — G8929 Other chronic pain: Secondary | ICD-10-CM | POA: Diagnosis not present

## 2020-12-01 DIAGNOSIS — M25562 Pain in left knee: Secondary | ICD-10-CM

## 2020-12-01 LAB — URIC ACID: Uric Acid, Serum: 5.4 mg/dL (ref 2.4–7.0)

## 2020-12-01 NOTE — Patient Instructions (Signed)
Thank you for coming in today.   Please get an Xray today before you leave   Please get labs today before you leave   I recommend you obtained a compression sleeve to help with your joint problems. There are many options on the market however I recommend obtaining a full knee Body Helix compression sleeve.  You can find information (including how to appropriate measure yourself for sizing) can be found at www.Body http://www.lambert.com/.  Many of these products are health savings account (HSA) eligible.   You can use the compression sleeve at any time throughout the day but is most important to use while being active as well as for 2 hours post-activity.   It is appropriate to ice following activity with the compression sleeve in place.   Please use Voltaren gel (Generic Diclofenac Gel) up to 4x daily for pain as needed.  This is available over-the-counter as both the name brand Voltaren gel and the generic diclofenac gel.   Recheck as needed.    If I see gout labs up I will prescribe medicine for it.

## 2020-12-02 NOTE — Progress Notes (Signed)
Left knee x-ray shows mild arthritis.

## 2020-12-02 NOTE — Progress Notes (Signed)
Spoke w/ pt and conveyed Dr. Corey's result note. Pt verbalized understanding.  

## 2020-12-02 NOTE — Progress Notes (Signed)
Uric acid is 5.4.  This indicates gout is very unlikely.

## 2020-12-12 ENCOUNTER — Ambulatory Visit: Payer: 59 | Admitting: Cardiology

## 2020-12-30 ENCOUNTER — Other Ambulatory Visit: Payer: Self-pay | Admitting: Internal Medicine

## 2021-01-02 ENCOUNTER — Other Ambulatory Visit: Payer: Self-pay | Admitting: Internal Medicine

## 2021-02-17 ENCOUNTER — Ambulatory Visit (INDEPENDENT_AMBULATORY_CARE_PROVIDER_SITE_OTHER): Payer: 59

## 2021-02-17 ENCOUNTER — Other Ambulatory Visit: Payer: Self-pay | Admitting: Internal Medicine

## 2021-02-17 ENCOUNTER — Encounter: Payer: Self-pay | Admitting: Internal Medicine

## 2021-02-17 ENCOUNTER — Other Ambulatory Visit: Payer: Self-pay

## 2021-02-17 ENCOUNTER — Ambulatory Visit (INDEPENDENT_AMBULATORY_CARE_PROVIDER_SITE_OTHER): Payer: 59 | Admitting: Internal Medicine

## 2021-02-17 VITALS — BP 128/82 | HR 100 | Temp 99.1°F | Ht 69.0 in | Wt 237.0 lb

## 2021-02-17 DIAGNOSIS — E7849 Other hyperlipidemia: Secondary | ICD-10-CM

## 2021-02-17 DIAGNOSIS — M545 Low back pain, unspecified: Secondary | ICD-10-CM

## 2021-02-17 DIAGNOSIS — Z720 Tobacco use: Secondary | ICD-10-CM

## 2021-02-17 DIAGNOSIS — E559 Vitamin D deficiency, unspecified: Secondary | ICD-10-CM

## 2021-02-17 DIAGNOSIS — Z23 Encounter for immunization: Secondary | ICD-10-CM | POA: Diagnosis not present

## 2021-02-17 DIAGNOSIS — I10 Essential (primary) hypertension: Secondary | ICD-10-CM | POA: Diagnosis not present

## 2021-02-17 DIAGNOSIS — R1310 Dysphagia, unspecified: Secondary | ICD-10-CM

## 2021-02-17 DIAGNOSIS — M25551 Pain in right hip: Secondary | ICD-10-CM

## 2021-02-17 DIAGNOSIS — R739 Hyperglycemia, unspecified: Secondary | ICD-10-CM | POA: Diagnosis not present

## 2021-02-17 LAB — BASIC METABOLIC PANEL
BUN: 14 mg/dL (ref 6–23)
CO2: 28 mEq/L (ref 19–32)
Calcium: 9.9 mg/dL (ref 8.4–10.5)
Chloride: 104 mEq/L (ref 96–112)
Creatinine, Ser: 0.87 mg/dL (ref 0.40–1.20)
GFR: 73.01 mL/min (ref >60.00)
Glucose, Bld: 160 mg/dL — ABNORMAL HIGH (ref 70–99)
Potassium: 3.7 mEq/L (ref 3.5–5.1)
Sodium: 140 mEq/L (ref 135–145)

## 2021-02-17 LAB — HEMOGLOBIN A1C: Hgb A1c MFr Bld: 7.4 % — ABNORMAL HIGH (ref 4.6–6.5)

## 2021-02-17 LAB — HEPATIC FUNCTION PANEL
ALT: 21 U/L (ref 0–35)
AST: 17 U/L (ref 0–37)
Albumin: 4.2 g/dL (ref 3.5–5.2)
Alkaline Phosphatase: 69 U/L (ref 39–117)
Bilirubin, Direct: 0.1 mg/dL (ref 0.0–0.3)
Total Bilirubin: 0.4 mg/dL (ref 0.2–1.2)
Total Protein: 6.8 g/dL (ref 6.0–8.3)

## 2021-02-17 LAB — LIPID PANEL
Cholesterol: 116 mg/dL (ref 0–200)
HDL: 41.4 mg/dL (ref >39.00)
LDL Cholesterol: 52 mg/dL (ref 0–99)
NonHDL: 74.2
Total CHOL/HDL Ratio: 3
Triglycerides: 110 mg/dL (ref 0.0–149.0)
VLDL: 22 mg/dL (ref 0.0–40.0)

## 2021-02-17 LAB — VITAMIN D 25 HYDROXY (VIT D DEFICIENCY, FRACTURES): VITD: 28.92 ng/mL — ABNORMAL LOW (ref 30.00–100.00)

## 2021-02-17 MED ORDER — CHOLECALCIFEROL 50 MCG (2000 UT) PO TABS: ORAL_TABLET | ORAL | 99 refills | Status: DC

## 2021-02-17 MED ORDER — METFORMIN HCL ER 500 MG PO TB24: 500.0000 mg | ORAL_TABLET | Freq: Every day | ORAL | 3 refills | Status: DC

## 2021-02-17 NOTE — Patient Instructions (Addendum)
You had the flu shot today  We can plan on the Prevnar 20 at your next visit.  Ok to double up on your current Vit D3 at home  Please continue all other medications as before, and refills have been done if requested.  Please have the pharmacy call with any other refills you may need.  Please continue your efforts at being more active, low cholesterol diet, and weight control.  You are otherwise up to date with prevention measures today.  Please keep your appointments with your specialists as you may have planned - Dr Georgina Snell for right hip and lower back pain, and Gastroenterology for the swallowing problem  Please go to the XRAY Department in the first floor for the x-ray testing  Please go to the LAB at the blood drawing area for the tests to be done  You will be contacted by phone if any changes need to be made immediately.  Otherwise, you will receive a letter about your results with an explanation, but please check with MyChart first.  Please remember to sign up for MyChart if you have not done so, as this will be important to you in the future with finding out test results, communicating by private email, and scheduling acute appointments online when needed.  Please make an Appointment to return in 6 months, or sooner if needed

## 2021-02-17 NOTE — Assessment & Plan Note (Addendum)
Last vitamin D Lab Results  Component Value Date   VD25OH 31.97 08/21/2020   Low normal, to double oral replacement

## 2021-02-17 NOTE — Progress Notes (Signed)
Patient ID: Jennifer Waters, female   DOB: 1961/06/08, 60 y.o.   MRN: [id number]        Chief Complaint: follow up hyperglycemia, low vit d, dysphagia, hip and knee pain       HPI:  Jennifer Waters is a 60 y.o. female here with c/o 3 mo recent onset pain to lying on right hip side or standing up, or with internal rotation, intermittent but often mod to severe; nothing else makes better or worse. Also 1-2 mo gradually worsening lower back pain worse to stand or bend.  Also left knee knee pain mod to severe at times, intermittent swelling, no giveaways or falls  Walks with cane.  Due for flu shot.  Pt denies chest pain, increased sob or doe, wheezing, orthopnea, PND, increased LE swelling, palpitations, dizziness or syncope.   Pt denies polydipsia, polyuria, or new focal neuro s/s.   Pt denies fever, wt loss, night sweats, loss of appetite, or other constitutional symptoms  Denies worsening reflux, abd pain, n/v, bowel change or blood, but has had worsening dysphgia x 2 mo to solids and coffee with wt loss   due for flu shot.   Taking Vit D but not sure how much.    Wt Readings from Last 3 Encounters:  02/17/21 237 lb (107.5 kg)  12/01/20 243 lb (110.2 kg)  10/30/20 248 lb (112.5 kg)   BP Readings from Last 3 Encounters:  02/17/21 128/82  12/01/20 122/82  10/30/20 136/90         Past Medical History:  Diagnosis Date   Arthritis    Depression    Hypertension    Migraines    Obesity    Past Surgical History:  Procedure Laterality Date   ABDOMINAL HYSTERECTOMY     BREAST MASS EXCISION     benign done as a child   TRANSTHORACIC ECHOCARDIOGRAM  12/2018    EF 55-60%.  Impaired relaxation but indeterminate filling pressures.  Normal RV size and function.  Normal valves.  Normal aortic root.    reports that she has been smoking cigarettes. She has a 10.00 pack-year smoking history. She has never used smokeless tobacco. She reports that she does not currently use alcohol after a past  usage of about 5.0 standard drinks per week. She reports current drug use. Frequency: 1.00 time per week. Drug: Marijuana. family history includes Arrhythmia in her sister; Arthritis in her mother and sister; Brain cancer in her brother; Diabetes in her brother; Edema in her sister; Lung cancer in her father; Migraines in her mother; Ovarian cancer in her mother. Allergies  Allergen Reactions   Chantix [Varenicline] Nausea Only   Current Outpatient Medications on File Prior to Visit  Medication Sig Dispense Refill   albuterol (VENTOLIN HFA) 108 (90 Base) MCG/ACT inhaler Inhale 2 puffs into the lungs every 6 (six) hours as needed for wheezing or shortness of breath. 6.7 g 2   amLODipine (NORVASC) 10 MG tablet Take 1 tablet (10 mg total) by mouth daily. 90 tablet 3   baclofen (LIORESAL) 10 MG tablet Take by mouth.     cyclobenzaprine (FLEXERIL) 5 MG tablet Take 1 tablet by mouth three times daily as needed 90 tablet 1   divalproex (DEPAKOTE) 500 MG DR tablet Take by mouth.     EMGALITY 120 MG/ML SOAJ Inject into the skin.     gabapentin (NEURONTIN) 100 MG capsule Take 2 capsules (200 mg total) by mouth at bedtime. 180 capsule 1  losartan-hydrochlorothiazide (HYZAAR) 50-12.5 MG tablet Take 1 tablet by mouth daily. 90 tablet 3   naproxen (NAPROSYN) 500 MG tablet Take 1 tablet (500 mg total) by mouth 2 (two) times daily. 30 tablet 0   omeprazole (PRILOSEC) 20 MG capsule TAKE 1 CAPSULE BY MOUTH EVERY DAY 90 capsule 0   SUMAtriptan (IMITREX) 100 MG tablet Take 1 tablet (100 mg total) by mouth every 2 (two) hours as needed for migraine or headache. May repeat in 2 hours if headache persists or recurs. 10 tablet 11   traMADol (ULTRAM) 50 MG tablet Take 1 tablet (50 mg total) by mouth every 6 (six) hours as needed. 60 tablet 1   venlafaxine XR (EFFEXOR XR) 150 MG 24 hr capsule Take 1 capsule (150 mg total) by mouth daily with breakfast. 90 capsule 3   atorvastatin (LIPITOR) 10 MG tablet Take 1 tablet (10  mg total) by mouth daily. 90 tablet 3   No current facility-administered medications on file prior to visit.        ROS:  All others reviewed and negative.  Objective        PE:  BP 128/82   Pulse 100   Temp 99.1 F (37.3 C) (Oral)   Ht '5\' 9"'$  (1.753 m)   Wt 237 lb (107.5 kg)   SpO2 99%   BMI 35.00 kg/m                 Constitutional: Pt appears in NAD               HENT: Head: NCAT.                Right Ear: External ear normal.                 Left Ear: External ear normal.                Eyes: . Pupils are equal, round, and reactive to light. Conjunctivae and EOM are normal               Nose: without d/c or deformity               Neck: Neck supple. Gross normal ROM               Cardiovascular: Normal rate and regular rhythm.                 Pulmonary/Chest: Effort normal and breath sounds without rales or wheezing.                Abd:  Soft, NT, ND, + BS, no organomegaly               Neurological: Pt is alert. At baseline orientation, motor grossly intact               Skin: Skin is warm. No rashes, no other new lesions, LE edema - none               Psychiatric: Pt behavior is normal without agitation   Micro: none  Cardiac tracings I have personally interpreted today:  none  Pertinent Radiological findings (summarize): none   Lab Results  Component Value Date   WBC 8.9 08/21/2020   HGB 13.5 08/21/2020   HCT 40.0 08/21/2020   PLT 221.0 08/21/2020   GLUCOSE 160 (H) 02/17/2021   CHOL 116 02/17/2021   TRIG 110.0 02/17/2021   HDL 41.40 02/17/2021   LDLCALC 52 02/17/2021  ALT 21 02/17/2021   AST 17 02/17/2021   NA 140 02/17/2021   K 3.7 02/17/2021   CL 104 02/17/2021   CREATININE 0.87 02/17/2021   BUN 14 02/17/2021   CO2 28 02/17/2021   TSH 1.23 08/21/2020   HGBA1C 7.4 (H) 02/17/2021   Assessment/Plan:  Jennifer Waters is a 60 y.o. Black or African American [2] female with  has a past medical history of Arthritis, Depression, Hypertension, Migraines,  and Obesity.  Vitamin D deficiency Last vitamin D Lab Results  Component Value Date   VD25OH 31.97 08/21/2020   Low normal, to double oral replacement   Tobacco abuse Pt counseled to quit smoking, pt not ready to quit  Hyperlipidemia due to dietary fat intake Lab Results  Component Value Date   LDLCALC 52 02/17/2021   Stable, pt to continue current statin lipitor 10   Hyperglycemia Lab Results  Component Value Date   HGBA1C 7.4 (H) 02/17/2021   Uncontrolled, but  pt to continue current medical treatment metformin Er 50 qd for now due to recent wt loss   Essential hypertension BP Readings from Last 3 Encounters:  02/17/21 128/82  12/01/20 122/82  10/30/20 136/90   Stable, pt to continue medical treatment norvasc, lisinopril hct   Dysphagia Etiology unclear, for GI referral  Right hip pain Mild to mod, for xray knee and hip, and f/u Dr Corey/sport med  Followup: Return in about 6 months (around 08/18/2021).  Cathlean Cower, MD 02/21/2021 8:20 PM [facility]

## 2021-02-18 ENCOUNTER — Encounter: Payer: Self-pay | Admitting: Internal Medicine

## 2021-02-21 ENCOUNTER — Encounter: Payer: Self-pay | Admitting: Internal Medicine

## 2021-02-21 DIAGNOSIS — M25551 Pain in right hip: Secondary | ICD-10-CM | POA: Insufficient documentation

## 2021-02-21 DIAGNOSIS — R1310 Dysphagia, unspecified: Secondary | ICD-10-CM | POA: Insufficient documentation

## 2021-02-21 NOTE — Assessment & Plan Note (Signed)
Pt counseled to quit smoking, pt not ready to quit

## 2021-02-21 NOTE — Assessment & Plan Note (Signed)
Lab Results  Component Value Date   HGBA1C 7.4 (H) 02/17/2021   Uncontrolled, but  pt to continue current medical treatment metformin Er 50 qd for now due to recent wt loss

## 2021-02-21 NOTE — Assessment & Plan Note (Signed)
Etiology unclear, for GI referral 

## 2021-02-21 NOTE — Assessment & Plan Note (Signed)
Mild to mod, for xray knee and hip, and f/u Dr Governor Rooks med

## 2021-02-21 NOTE — Assessment & Plan Note (Signed)
Lab Results  Component Value Date   LDLCALC 52 02/17/2021   Stable, pt to continue current statin lipitor 10

## 2021-02-21 NOTE — Assessment & Plan Note (Signed)
BP Readings from Last 3 Encounters:  02/17/21 128/82  12/01/20 122/82  10/30/20 136/90   Stable, pt to continue medical treatment norvasc, lisinopril hct

## 2021-03-13 ENCOUNTER — Other Ambulatory Visit: Payer: Self-pay

## 2021-03-13 ENCOUNTER — Ambulatory Visit (INDEPENDENT_AMBULATORY_CARE_PROVIDER_SITE_OTHER): Payer: 59 | Admitting: Sports Medicine

## 2021-03-13 VITALS — BP 110/70 | HR 106 | Ht 69.0 in | Wt 237.0 lb

## 2021-03-13 DIAGNOSIS — M791 Myalgia, unspecified site: Secondary | ICD-10-CM | POA: Diagnosis not present

## 2021-03-13 DIAGNOSIS — M2550 Pain in unspecified joint: Secondary | ICD-10-CM

## 2021-03-13 DIAGNOSIS — G8929 Other chronic pain: Secondary | ICD-10-CM

## 2021-03-13 DIAGNOSIS — M25561 Pain in right knee: Secondary | ICD-10-CM | POA: Diagnosis not present

## 2021-03-13 DIAGNOSIS — M25551 Pain in right hip: Secondary | ICD-10-CM | POA: Diagnosis not present

## 2021-03-13 DIAGNOSIS — M25562 Pain in left knee: Secondary | ICD-10-CM

## 2021-03-13 LAB — IBC PANEL
Iron: 82 ug/dL (ref 42–145)
Saturation Ratios: 21.9 % (ref 20.0–50.0)
TIBC: 375.2 ug/dL (ref 250.0–450.0)
Transferrin: 268 mg/dL (ref 212.0–360.0)

## 2021-03-13 LAB — FERRITIN: Ferritin: 152.4 ng/mL (ref 10.0–291.0)

## 2021-03-13 LAB — C-REACTIVE PROTEIN: CRP: <1 mg/dL (ref 0.5–20.0)

## 2021-03-13 LAB — SEDIMENTATION RATE: Sed Rate: 9 mm/hr (ref 0–30)

## 2021-03-13 LAB — CK: Total CK: 181 U/L — ABNORMAL HIGH (ref 7–177)

## 2021-03-13 LAB — URIC ACID: Uric Acid, Serum: 5.4 mg/dL (ref 2.4–7.0)

## 2021-03-13 MED ORDER — MELOXICAM 15 MG PO TABS: 15.0000 mg | ORAL_TABLET | Freq: Every day | ORAL | 0 refills | Status: DC

## 2021-03-13 NOTE — Patient Instructions (Addendum)
Good to see you  Referral to rheumatology Get labs on your way out  Meloxicam 15mg  daily for two weeks the rest as needed  See me again in 4 weeks

## 2021-03-13 NOTE — Progress Notes (Signed)
Benito Mccreedy D.[facility] Phone: [phone]   Assessment and Plan:     1. Polyarthralgia 2. Muscle pain -Chronic, subsequent sports medicine visit - Patient experiencing polyarthralgia, muscle cramping that has not improved with conservative therapy and with family history of arthritic conditions at younger ages (unknown if these were autoimmune conditions) - Patient had lab work in the past showing positive ANA and elevated CK levels, but did not follow-up with rheumatology afterwards - Well obtain updated labs today and referral to rheumatology for further evaluation - ANA; Future - Calcium, ionized; Future - C-reactive protein; Future - Cyclic citrul peptide antibody, IgG; Future - Ferritin; Future - IBC panel; Future - Rheumatoid factor; Future - Sedimentation rate; Future - Uric acid; Future - CK (Creatine Kinase); Future - Ambulatory referral to Rheumatology   3. Bilateral chronic knee pain -Chronic, unchanged, subsequent sports medicine visit - We will work-up with lab work as above and referral to rheumatology - Start meloxicam 15 mg daily  4. Right hip pain -Chronic, unchanged, subsequent sports medicine visit - We will work-up with lab work as above and referral to rheumatology - Start meloxicam 15 mg daily    Pertinent previous records reviewed include previous sports medicine note, family medicine note, lab work including CK, ANA, rheumatoid factor, CBC, CMP   Follow Up: In 4 weeks for reevaluation and review of rheumatology recommendations   Subjective:   I, Judy Pimple, am serving as a scribe for Dr. Glennon Mac  Chief Complaint: hip pain    HPI: 60 year old female presenting with right hip pain   03/13/21 Patient states that she has been having right sided hip pain for a little over three months that is worse with standing or internal rotation. Patient states the pain  is intermittent but can get to be severe with certain movements. Patient has noticed pain going to her lower back and knees in the last 1-2 months. Patient describes the hip pain as sharp and the knee pain as burning. Patient has tried wearing compression sleeves on her knees, tylenol and muscle relaxer's to help with the pain. Patient states that she has numbness at the ball of her feet and Numbness and tingling in her right arm and hand. Patient has weakness in both arms and in her knees.   Relevant Historical Information: Chronic pains, family history of arthritis like conditions of her parents in their 17s and 48s  Additional pertinent review of systems negative.   Current Outpatient Medications:    meloxicam (MOBIC) 15 MG tablet, Take 1 tablet (15 mg total) by mouth daily., Disp: 30 tablet, Rfl: 0   albuterol (VENTOLIN HFA) 108 (90 Base) MCG/ACT inhaler, Inhale 2 puffs into the lungs every 6 (six) hours as needed for wheezing or shortness of breath., Disp: 6.7 g, Rfl: 2   amLODipine (NORVASC) 10 MG tablet, Take 1 tablet (10 mg total) by mouth daily., Disp: 90 tablet, Rfl: 3   atorvastatin (LIPITOR) 10 MG tablet, Take 1 tablet (10 mg total) by mouth daily., Disp: 90 tablet, Rfl: 3   baclofen (LIORESAL) 10 MG tablet, Take by mouth., Disp: , Rfl:    Cholecalciferol 50 MCG (2000 UT) TABS, 1 tab by mouth once daily, Disp: 30 tablet, Rfl: 99   cyclobenzaprine (FLEXERIL) 5 MG tablet, Take 1 tablet by mouth three times daily as needed, Disp: 90 tablet, Rfl: 1   divalproex (DEPAKOTE) 500 MG DR tablet, Take  by mouth., Disp: , Rfl:    EMGALITY 120 MG/ML SOAJ, Inject into the skin., Disp: , Rfl:    gabapentin (NEURONTIN) 100 MG capsule, Take 2 capsules (200 mg total) by mouth at bedtime., Disp: 180 capsule, Rfl: 1   losartan-hydrochlorothiazide (HYZAAR) 50-12.5 MG tablet, Take 1 tablet by mouth daily., Disp: 90 tablet, Rfl: 3   metFORMIN (GLUCOPHAGE-XR) 500 MG 24 hr tablet, Take 1 tablet (500 mg total)  by mouth daily with breakfast., Disp: 90 tablet, Rfl: 3   naproxen (NAPROSYN) 500 MG tablet, Take 1 tablet (500 mg total) by mouth 2 (two) times daily., Disp: 30 tablet, Rfl: 0   omeprazole (PRILOSEC) 20 MG capsule, TAKE 1 CAPSULE BY MOUTH EVERY DAY, Disp: 90 capsule, Rfl: 0   SUMAtriptan (IMITREX) 100 MG tablet, Take 1 tablet (100 mg total) by mouth every 2 (two) hours as needed for migraine or headache. May repeat in 2 hours if headache persists or recurs., Disp: 10 tablet, Rfl: 11   traMADol (ULTRAM) 50 MG tablet, Take 1 tablet (50 mg total) by mouth every 6 (six) hours as needed., Disp: 60 tablet, Rfl: 1   venlafaxine XR (EFFEXOR XR) 150 MG 24 hr capsule, Take 1 capsule (150 mg total) by mouth daily with breakfast., Disp: 90 capsule, Rfl: 3   Objective:     Vitals:   03/13/21 1058  BP: 110/70  Pulse: (!) 106  SpO2: 95%  Weight: 237 lb (107.5 kg)  Height: 5\' 9"  (1.753 m)      Body mass index is 35 kg/m.    Physical Exam:    General: awake, alert, and oriented no acute distress, nontoxic Skin: no suspicious lesions or rashes Neuro:sensation intact distally with no dificits, normal muscle tone, no atrophy, strength 5/5 in all tested lower ext groups Psych: normal mood and affect, speech clear  Right hip: No deformity, swelling or wasting ROM Fexion 70, ext 20, IR 30, ER 30 NTTP over the hip flexors, adductors, inguinal canal, pubic ramus, greater troch, glute musculature, si joint, lumbar spine Negative log roll with FROM Gait normal   General:  awake, alert oriented, no acute distress nontoxic Skin: no suspicious lesions or rashes Neuro:sensation intact, no deficits, strength 5/5 with no deficits, no atrophy, normal muscle tone Psych: No signs of anxiety, depression or other mood disorder  Bilateral knee: No swelling No deformity Neg fluid wave, joint milking ROM Flex 100, Ext 5 TTP medial lateral joint line NTTP over the quad tendon, medial fem condyle, lat fem  condyle, patella, plica, patella tendon, tibial tuberostiy, fibular head, posterior fossa, pes anserine bursa, gerdy's tubercle Gait normal   Electronically signed by:  Benito Mccreedy D.[facility] 1:15 PM 03/13/21

## 2021-03-16 LAB — ANA: Anti Nuclear Antibody (ANA): POSITIVE — AB

## 2021-03-16 LAB — CALCIUM, IONIZED: Calcium, Ion: 5.45 mg/dL (ref 4.8–5.6)

## 2021-03-16 LAB — CYCLIC CITRUL PEPTIDE ANTIBODY, IGG: Cyclic Citrullin Peptide Ab: <16 UNITS

## 2021-03-16 LAB — ANTI-NUCLEAR AB-TITER (ANA TITER)
ANA TITER: 1:1280 {titer} — ABNORMAL HIGH
ANA Titer 1: 1:320 {titer} — ABNORMAL HIGH

## 2021-03-16 LAB — RHEUMATOID FACTOR: Rheumatoid fact SerPl-aCnc: <14 IU/mL (ref <14)

## 2021-04-09 NOTE — Progress Notes (Signed)
Benito Mccreedy D.[place] Phone: [phone]   Assessment and Plan:     1. Positive ANA (antinuclear antibody) 2. Polyarthralgia  - Chronic, unchanged, subsequent visit - Lab work from previous office visit shows positive ANA with nuclear speckled pattern.  Based on patient's symptoms patient may be experiencing polymyositis versus mixed connective tissue disorder versus lupus. - Stressed the importance of establishing care with rheumatology for chronic treatment.  Patient says that she will work on establishing care - Changed to using meloxicam as needed.  Educated how NSAIDs would not be a viable chronic daily treatment option for her - Follow-up as needed   Pertinent previous records reviewed include none   Follow Up: as needed     Subjective:   I, Jacqualin Combes, am serving as a scribe for Dr. Benito Mccreedy.  This visit occurred during the SARS-CoV-2 public health emergency.  Safety protocols were in place, including screening questions prior to the visit, additional usage of staff PPE, and extensive cleaning of exam room while observing appropriate contact time as indicated for disinfecting solutions.   Chief Complaint: polyarthralgia  HPI:   03/13/21 Patient states that she has been having right sided hip pain for a little over three months that is worse with standing or internal rotation. Patient states the pain is intermittent but can get to be severe with certain movements. Patient has noticed pain going to her lower back and knees in the last 1-2 months. Patient describes the hip pain as sharp and the knee pain as burning. Patient has tried wearing compression sleeves on her knees, tylenol and muscle relaxer's to help with the pain. Patient states that she has numbness at the ball of her feet and Numbness and tingling in her right arm and hand. Patient has weakness in both arms and in her knees.    04/10/21 Today patient states that she had relief overall from Meloxicam. Tries to not use medication each day. Has not seen rheumatology yet due to financial restrictions. Continued R hip pain with IR and ER in groin.   Relevant Historical Information: Chronic pains, family history of arthritis like conditions of her parents in their 83s and 28s  Additional pertinent review of systems negative.   Current Outpatient Medications:    albuterol (VENTOLIN HFA) 108 (90 Base) MCG/ACT inhaler, Inhale 2 puffs into the lungs every 6 (six) hours as needed for wheezing or shortness of breath., Disp: 6.7 g, Rfl: 2   amLODipine (NORVASC) 10 MG tablet, Take 1 tablet (10 mg total) by mouth daily., Disp: 90 tablet, Rfl: 3   baclofen (LIORESAL) 10 MG tablet, Take by mouth., Disp: , Rfl:    Cholecalciferol 50 MCG (2000 UT) TABS, 1 tab by mouth once daily, Disp: 30 tablet, Rfl: 99   cyclobenzaprine (FLEXERIL) 5 MG tablet, Take 1 tablet by mouth three times daily as needed, Disp: 90 tablet, Rfl: 1   divalproex (DEPAKOTE) 500 MG DR tablet, Take by mouth., Disp: , Rfl:    EMGALITY 120 MG/ML SOAJ, Inject into the skin., Disp: , Rfl:    gabapentin (NEURONTIN) 100 MG capsule, Take 2 capsules (200 mg total) by mouth at bedtime., Disp: 180 capsule, Rfl: 1   losartan-hydrochlorothiazide (HYZAAR) 50-12.5 MG tablet, Take 1 tablet by mouth daily., Disp: 90 tablet, Rfl: 3   meloxicam (MOBIC) 15 MG tablet, Take 1 tablet (15 mg total) by mouth daily., Disp: 30 tablet, Rfl: 0  metFORMIN (GLUCOPHAGE-XR) 500 MG 24 hr tablet, Take 1 tablet (500 mg total) by mouth daily with breakfast., Disp: 90 tablet, Rfl: 3   naproxen (NAPROSYN) 500 MG tablet, Take 1 tablet (500 mg total) by mouth 2 (two) times daily., Disp: 30 tablet, Rfl: 0   omeprazole (PRILOSEC) 20 MG capsule, TAKE 1 CAPSULE BY MOUTH EVERY DAY, Disp: 90 capsule, Rfl: 0   SUMAtriptan (IMITREX) 100 MG tablet, Take 1 tablet (100 mg total) by mouth every 2 (two) hours as  needed for migraine or headache. May repeat in 2 hours if headache persists or recurs., Disp: 10 tablet, Rfl: 11   traMADol (ULTRAM) 50 MG tablet, Take 1 tablet (50 mg total) by mouth every 6 (six) hours as needed., Disp: 60 tablet, Rfl: 1   venlafaxine XR (EFFEXOR XR) 150 MG 24 hr capsule, Take 1 capsule (150 mg total) by mouth daily with breakfast., Disp: 90 capsule, Rfl: 3   atorvastatin (LIPITOR) 10 MG tablet, Take 1 tablet (10 mg total) by mouth daily., Disp: 90 tablet, Rfl: 3   Objective:     Vitals:   04/10/21 1104  BP: 122/68  Pulse: (!) 105  SpO2: 94%  Weight: 236 lb (107 kg)  Height: 5\' 9"  (1.753 m)      Body mass index is 34.85 kg/m.    Physical Exam:      General: awake, alert, and oriented no acute distress, nontoxic Skin: no suspicious lesions or rashes Neuro:sensation intact distally with no dificits, normal muscle tone, no atrophy, strength 5/5 in all tested lower ext groups Psych: normal mood and affect, speech clear   Electronically signed by:  Benito Mccreedy D.[facility] 11:25 AM 04/10/21

## 2021-04-10 ENCOUNTER — Ambulatory Visit (INDEPENDENT_AMBULATORY_CARE_PROVIDER_SITE_OTHER): Payer: 59 | Admitting: Sports Medicine

## 2021-04-10 ENCOUNTER — Other Ambulatory Visit: Payer: Self-pay

## 2021-04-10 VITALS — BP 122/68 | HR 105 | Ht 69.0 in | Wt 236.0 lb

## 2021-04-10 DIAGNOSIS — M2550 Pain in unspecified joint: Secondary | ICD-10-CM | POA: Diagnosis not present

## 2021-04-10 DIAGNOSIS — R768 Other specified abnormal immunological findings in serum: Secondary | ICD-10-CM | POA: Diagnosis not present

## 2021-04-27 ENCOUNTER — Telehealth: Payer: Self-pay

## 2021-04-27 ENCOUNTER — Other Ambulatory Visit: Payer: Self-pay | Admitting: Internal Medicine

## 2021-04-27 ENCOUNTER — Other Ambulatory Visit: Payer: Self-pay

## 2021-04-27 NOTE — Telephone Encounter (Signed)
1.Medication Requested: flexeril  2. Pharmacy (Name, [address], [place]): [facility]  3. On Med List: yes  4. Last Visit with PCP: [id number]     Agent: Please be advised that RX refills may take up to 3 business days. We ask that you follow-up with your pharmacy.

## 2021-06-04 ENCOUNTER — Other Ambulatory Visit: Payer: Self-pay | Admitting: Internal Medicine

## 2021-06-24 ENCOUNTER — Emergency Department (HOSPITAL_COMMUNITY)
Admission: EM | Admit: 2021-06-24 | Discharge: 2021-06-24 | Disposition: A | Discharge status: Home / Self Care | Payer: Self-pay | Attending: Emergency Medicine | Admitting: Emergency Medicine

## 2021-06-24 ENCOUNTER — Other Ambulatory Visit: Payer: Self-pay

## 2021-06-24 ENCOUNTER — Emergency Department (HOSPITAL_COMMUNITY)
Admission: EM | Admit: 2021-06-24 | Discharge: 2021-06-24 | Discharge status: Against Medical Advice | Payer: Medicaid Other | Source: Home / Self Care

## 2021-06-24 ENCOUNTER — Emergency Department (HOSPITAL_COMMUNITY): Payer: Self-pay

## 2021-06-24 ENCOUNTER — Encounter (HOSPITAL_COMMUNITY): Payer: Self-pay

## 2021-06-24 DIAGNOSIS — S7011XA Contusion of right thigh, initial encounter: Secondary | ICD-10-CM | POA: Insufficient documentation

## 2021-06-24 DIAGNOSIS — R52 Pain, unspecified: Secondary | ICD-10-CM

## 2021-06-24 DIAGNOSIS — M25551 Pain in right hip: Secondary | ICD-10-CM | POA: Insufficient documentation

## 2021-06-24 DIAGNOSIS — G8929 Other chronic pain: Secondary | ICD-10-CM | POA: Insufficient documentation

## 2021-06-24 DIAGNOSIS — W19XXXA Unspecified fall, initial encounter: Secondary | ICD-10-CM | POA: Insufficient documentation

## 2021-06-24 DIAGNOSIS — M25561 Pain in right knee: Secondary | ICD-10-CM | POA: Insufficient documentation

## 2021-06-24 NOTE — ED Notes (Signed)
Pt ambulatory without assistance.  

## 2021-06-24 NOTE — ED Provider Notes (Signed)
[facility] DEPT Provider Note   CSN: [id number] Arrival date & time: 06/24/21  0437     History  Chief Complaint  Patient presents with   Fall   Leg Pain    Jennifer Waters is a 61 y.o. female.   Fall  Leg Pain Patient presents after fall.  Was stepping over a baby gate and fell landing on her right thigh.  Happened last night.  States the pain is severe.  States she cannot ambulate due to it.  No deformity.  States she put some ice on it and waited to see if it got better and it did not get better.  Pain is mostly in the mid thigh.  No other injury.  Does have chronic knee and hip pain.  Has seen sports medicine previously.  Not on blood thinners.  Did not hit head.    Home Medications Prior to Admission medications   Medication Sig Start Date End Date Taking? Authorizing Provider  albuterol (VENTOLIN HFA) 108 (90 Base) MCG/ACT inhaler Inhale 2 puffs into the lungs every 6 (six) hours as needed for wheezing or shortness of breath. 11/21/19   Marrian Salvage, FNP  amLODipine (NORVASC) 10 MG tablet Take 1 tablet (10 mg total) by mouth daily. 07/24/20   Leonie Man, MD  atorvastatin (LIPITOR) 10 MG tablet Take 1 tablet (10 mg total) by mouth daily. 07/24/20 10/22/20  Leonie Man, MD  baclofen (LIORESAL) 10 MG tablet Take by mouth. 08/07/20 08/07/21  [provider]  Cholecalciferol 50 MCG (2000 UT) TABS 1 tab by mouth once daily 02/17/21   Biagio Borg, MD  cyclobenzaprine (FLEXERIL) 5 MG tablet Take 1 tablet by mouth three times daily as needed 06/04/21   Biagio Borg, MD  divalproex (DEPAKOTE) 500 MG DR tablet Take by mouth. 08/07/20   [provider]  EMGALITY 120 MG/ML SOAJ Inject into the skin. 08/14/20   [provider]  gabapentin (NEURONTIN) 100 MG capsule Take 2 capsules (200 mg total) by mouth at bedtime. 02/28/20   Biagio Borg, MD  losartan-hydrochlorothiazide (HYZAAR) 50-12.5 MG tablet Take 1 tablet by  mouth daily. 07/24/20   Leonie Man, MD  meloxicam (MOBIC) 15 MG tablet Take 1 tablet (15 mg total) by mouth daily. 03/13/21   Glennon Mac, DO  metFORMIN (GLUCOPHAGE-XR) 500 MG 24 hr tablet Take 1 tablet (500 mg total) by mouth daily with breakfast. 02/17/21   Biagio Borg, MD  naproxen (NAPROSYN) 500 MG tablet Take 1 tablet (500 mg total) by mouth 2 (two) times daily. 09/25/20   Wieters, Hallie C, PA-C  omeprazole (PRILOSEC) 20 MG capsule TAKE 1 CAPSULE BY MOUTH EVERY DAY 06/23/17   Lance Sell, NP  SUMAtriptan (IMITREX) 100 MG tablet Take 1 tablet (100 mg total) by mouth every 2 (two) hours as needed for migraine or headache. May repeat in 2 hours if headache persists or recurs. 02/28/20   Biagio Borg, MD  traMADol (ULTRAM) 50 MG tablet Take 1 tablet (50 mg total) by mouth every 6 (six) hours as needed. 02/28/20   Biagio Borg, MD  venlafaxine XR (EFFEXOR XR) 150 MG 24 hr capsule Take 1 capsule (150 mg total) by mouth daily with breakfast. 09/10/20   Biagio Borg, MD      Allergies    Chantix [varenicline]    Review of Systems   Review of Systems  Physical Exam Updated Vital Signs BP 127/83 (BP  Location: Left Arm)    Pulse 74    Temp 97.8 F (36.6 C) (Oral)    Resp 17    SpO2 97%  Physical Exam Vitals and nursing note reviewed.  Musculoskeletal:     Comments: Some tenderness to right mid thigh.  No deformity.  Good range of motion in knee and hip.  No specific right hip or pelvic tenderness.  Skin:    General: Skin is warm.     Capillary Refill: Capillary refill takes less than 2 seconds.  Neurological:     Mental Status: She is alert and oriented to person, place, and time.    ED Results / Procedures / Treatments   Labs (all labs ordered are listed, but only abnormal results are displayed) Labs Reviewed - No data to display  EKG None  Radiology DG Knee Complete 4 Views Right  Result Date: 06/24/2021 CLINICAL DATA:  Knee pain after a fall. EXAM: RIGHT KNEE -  COMPLETE 4+ VIEW COMPARISON:  None. FINDINGS: No evidence for an acute fracture. No dislocation. Loss of joint space noted medial compartment. Hypertrophic spurring evident in all 3 compartments. No substantial joint effusion. No worrisome lytic or sclerotic osseous abnormality. IMPRESSION: 1. No acute bony abnormality. 2. Tricompartmental degenerative changes. Electronically Signed   By: Misty Stanley M.D.   On: 06/24/2021 06:17   DG Hip Unilat W or Wo Pelvis 2-3 Views Right  Result Date: 06/24/2021 CLINICAL DATA:  Right lower extremity pain after a fall. EXAM: DG HIP (WITH OR WITHOUT PELVIS) 2-3V RIGHT COMPARISON:  None. FINDINGS: There is no evidence of hip fracture or dislocation. There is no evidence of arthropathy or other focal bone abnormality. IMPRESSION: Negative. Electronically Signed   By: Misty Stanley M.D.   On: 06/24/2021 06:16   DG FEMUR, MIN 2 VIEWS RIGHT  Result Date: 06/24/2021 CLINICAL DATA:  Right lower extremity pain after a fall. EXAM: RIGHT FEMUR 2 VIEWS COMPARISON:  Right hip and knee films performed at the same time. FINDINGS: There is no evidence of fracture or other focal bone lesions. Soft tissues are unremarkable. IMPRESSION: Negative. Electronically Signed   By: Misty Stanley M.D.   On: 06/24/2021 06:18    Procedures Procedures    Medications Ordered in ED Medications - No data to display  ED Course/ Medical Decision Making/ A&P                           Medical Decision Making Amount and/or Complexity of Data Reviewed Radiology: independent interpretation performed. Decision-making details documented in ED Course.  Risk Prescription drug management.  Patient with mechanical fall.  Landed on right hip.  Pain in right knee which is chronic.  X-ray reassuring.  No fracture.  Only mild tenderness.  Also tenderness to right mid thigh.  No ecchymosis.  Doubt severe injury.  Negative x-ray for fracture.  Will discharge home.  Already on muscle relaxers.  Will  discharge home with sports medicine follow-up as needed         Final Clinical Impression(s) / ED Diagnoses Final diagnoses:  Fall, initial encounter  Contusion of right thigh, initial encounter    Rx / DC Orders ED Discharge Orders     None         Davonna Belling, MD 06/24/21 [phone]

## 2021-06-24 NOTE — ED Provider Notes (Signed)
Emergency Medicine Provider Triage Evaluation Note  Jennifer Waters , a 61 y.o. female  was evaluated in triage.  Pt complains of pain to right thigh and knee after suffering a fall yesterday evening approximately 2300.  Patient denies hitting her head or any loss of consciousness.  Patient is not on any blood thinners.  Patient has had increased pain with weightbearing.  Review of Systems  Positive: Arthralgia, myalgia Negative: Numbness, weakness, syncope, neck pain, back pain  Physical Exam  BP 124/75 (BP Location: Left Arm)    Pulse 85    Resp 16    SpO2 98%  Gen:   Awake, no distress   Resp:  Normal effort  MSK:   Moves extremities without difficulty, tenderness to right thigh, diffuse tenderness to right knee.  No deformity to right lower extremity.  +2 right DP pulse. Other:    Medical Decision Making  Medically screening exam initiated at 5:03 AM.  Appropriate orders placed.  Jennifer Waters was informed that the remainder of the evaluation will be completed by another provider, this initial triage assessment does not replace that evaluation, and the importance of remaining in the ED until their evaluation is complete.  X-ray imaging ordered   Loni Beckwith, PA-C 06/24/21 7530    Merryl Hacker, MD 06/25/21 [phone]

## 2021-06-24 NOTE — ED Triage Notes (Signed)
Pt reports with right upper leg pain after a fall yesterday. Pt states that she is unable to put weight on that leg.

## 2021-06-29 ENCOUNTER — Other Ambulatory Visit: Payer: Self-pay

## 2021-06-29 ENCOUNTER — Encounter: Payer: Self-pay | Admitting: Cardiology

## 2021-06-29 ENCOUNTER — Ambulatory Visit (INDEPENDENT_AMBULATORY_CARE_PROVIDER_SITE_OTHER): Payer: PRIVATE HEALTH INSURANCE | Admitting: Cardiology

## 2021-06-29 VITALS — BP 134/88 | HR 104 | Resp 20 | Ht 69.0 in | Wt 242.0 lb

## 2021-06-29 DIAGNOSIS — I10 Essential (primary) hypertension: Secondary | ICD-10-CM | POA: Diagnosis not present

## 2021-06-29 DIAGNOSIS — Z720 Tobacco use: Secondary | ICD-10-CM

## 2021-06-29 DIAGNOSIS — E669 Obesity, unspecified: Secondary | ICD-10-CM | POA: Diagnosis not present

## 2021-06-29 DIAGNOSIS — E7849 Other hyperlipidemia: Secondary | ICD-10-CM

## 2021-06-29 DIAGNOSIS — R519 Headache, unspecified: Secondary | ICD-10-CM

## 2021-06-29 DIAGNOSIS — G8929 Other chronic pain: Secondary | ICD-10-CM

## 2021-06-29 MED ORDER — ATORVASTATIN CALCIUM 10 MG PO TABS
10.0000 mg | ORAL_TABLET | Freq: Every day | ORAL | 3 refills | Status: DC
Start: 2021-06-29 — End: 2022-07-21

## 2021-06-29 MED ORDER — AMLODIPINE BESYLATE 10 MG PO TABS: 10.0000 mg | ORAL_TABLET | Freq: Every day | ORAL | 3 refills | Status: DC

## 2021-06-29 MED ORDER — LOSARTAN POTASSIUM-HCTZ 50-12.5 MG PO TABS: 1.0000 | ORAL_TABLET | Freq: Every day | ORAL | 3 refills | Status: DC

## 2021-06-29 NOTE — Assessment & Plan Note (Signed)
Unfortunately, Imitrex is hard for her to get.  She has been taken OTC medication.  I suspect that that may be some NSAID component of it.  As long as she is not chronically taking NSAIDs, they should be okay as far as blood pressure affect, but chronic use of NSAIDs will increase it.  Explained this to her.

## 2021-06-29 NOTE — Assessment & Plan Note (Signed)
Blood pressure actually looks pretty good today, despite the fact that she is having pain..  Had thought about potentially starting beta-blocker, but with her known chronic fatigue, will hold off.  Tachycardia is likely related to pain.  Continue current dose of amlodipine and losartan-HCTZ.

## 2021-06-29 NOTE — Assessment & Plan Note (Signed)
Still not yet ready to quit.  We talked about the financial ramifications.  Also talked about the fact that she is now living close to her grandkids.  She is thinking about.

## 2021-06-29 NOTE — Progress Notes (Signed)
Primary Care Provider: Biagio Borg, MD Cardiologist: Glenetta Hew, MD Electrophysiologist: None  Clinic Note: Chief Complaint  Patient presents with   Follow-up    Doing well with exception of recent fall   Hypertension    Current blood pressure is about 10 points higher than they usually are not, stating that her leg is hurting her today following fall   ===================================  ASSESSMENT/PLAN   Problem List Items Addressed This Visit       Cardiology Problems   Hyperlipidemia due to dietary fat intake (Chronic)    LDL again 52.  Doing well on 10 mg atorvastatin.      Relevant Medications   amLODipine (NORVASC) 10 MG tablet   atorvastatin (LIPITOR) 10 MG tablet   losartan-hydrochlorothiazide (HYZAAR) 50-12.5 MG tablet   Other Relevant Orders   EKG 12-Lead   Essential hypertension - Primary (Chronic)    Blood pressure actually looks pretty good today, despite the fact that she is having pain..  Had thought about potentially starting beta-blocker, but with her known chronic fatigue, will hold off.  Tachycardia is likely related to pain.  Continue current dose of amlodipine and losartan-HCTZ.      Relevant Medications   amLODipine (NORVASC) 10 MG tablet   atorvastatin (LIPITOR) 10 MG tablet   losartan-hydrochlorothiazide (HYZAAR) 50-12.5 MG tablet   Other Relevant Orders   EKG 12-Lead     Other   Obesity (BMI 35.0-39.9 without comorbidity) (Chronic)    Unfortunate, she actually gained weight over the holidays.  We talk about the importance of getting back into her dietary modification regimen and increasing her walking.  Musculoskeletal pain is really limited her walking that much.      Chronic headache    Unfortunately, Imitrex is hard for her to get.  She has been taken OTC medication.  I suspect that that may be some NSAID component of it.  As long as she is not chronically taking NSAIDs, they should be okay as far as blood pressure affect, but  chronic use of NSAIDs will increase it.  Explained this to her.      Relevant Medications   amLODipine (NORVASC) 10 MG tablet   Tobacco abuse (Chronic)    Still not yet ready to quit.  We talked about the financial ramifications.  Also talked about the fact that she is now living close to her grandkids.  She is thinking about.       ===================================  HPI:    Jennifer Waters is an obese 61 y.o. female-chronic smoker with a PMH Hypertension, OSA, polyarthralgia/ANA positive, hyperglycemia who presents today for 59-month follow-up.  Jennifer Waters was last seen on July 24, 2020 followed by a visit with Tommy Medal, RPH- CCP, [facility] and [facility] hypertension clinic.  She has been out of her medications.  We have recently restarted meds.  She was started on amlodipine 10 mg daily as well as losartan-HCTZ 1252/12.5.  She was also put back on atorvastatin 10 mg daily.  They discussed importance of dietary modification and exercise.  She is very happy that she was able to get these medications through the [facility]:  06/24/2020-had a fall with bruising on right thigh.  She was carrying her grandchild and tripped over a baby fence.  They are both okay but she has a large bruise on her thigh.  Now walking with a walker.  Reviewed  CV studies:    The following studies were reviewed  today: (if available, images/films reviewed: From Epic Chart or Care Everywhere) None:   Interval History:   Jennifer Waters presents today overall doing fairly well.  She is very happy that her blood pressures been doing well on the medication regimen that she has been taking.  She really is doing fine.  She does admit that over the holidays and now her grandson's recent birthday, that she may have overindulged with eating.  Once she recovers from this fall, she plans to get back into doing some exercise.  Heart rate is little fast today, but is not usually  the case.  She says that her leg is hurting her some and she is having trouble walking-using a crutch.  Otherwise, from a cardiac standpoint she is doing fine most of her issues are revolving around musculoskeletal pains with bilateral lower rib and shoulder pains.  She has been diagnosed with polyarthralgia and positive ANA.  She is being followed by rheumatology now.  She also has chronic daily headaches.  CV Review of Symptoms (Summary) Cardiovascular ROS: no chest pain or dyspnea on exertion negative for - edema, irregular heartbeat, orthopnea, palpitations, paroxysmal nocturnal dyspnea, rapid heart rate, shortness of breath, or lightheaded, dizziness or wooziness, syncope/near syncope or TIA/amaurosis fugax, claudication  REVIEWED OF SYSTEMS   Review of Systems  Constitutional:  Positive for malaise/fatigue (Related to her musculoskeletal pains). Negative for weight loss.  Cardiovascular:  Negative for leg swelling.  Gastrointestinal:  Negative for blood in stool and melena.  Genitourinary:  Negative for hematuria.  Musculoskeletal:  Positive for falls (Per HPI), joint pain and myalgias.       Significant pains underneath both lower ribs area as well as mostly left upper arm/shoulder.  Neurological:  Negative for dizziness, focal weakness, weakness and headaches.  Psychiatric/Behavioral:  Negative for depression and memory loss. The patient is not nervous/anxious and does not have insomnia.    I have reviewed and (if needed) personally updated the patient's problem list, medications, allergies, past medical and surgical history, social and family history.   PAST MEDICAL HISTORY   Past Medical History:  Diagnosis Date   Arthritis    Depression    Hypertension    Migraines    Obesity     PAST SURGICAL HISTORY   Past Surgical History:  Procedure Laterality Date   ABDOMINAL HYSTERECTOMY     BREAST MASS EXCISION     benign done as a child   TRANSTHORACIC ECHOCARDIOGRAM   12/2018    EF 55-60%.  Impaired relaxation but indeterminate filling pressures.  Normal RV size and function.  Normal valves.  Normal aortic root.    Immunization History  Administered Date(s) Administered   Influenza,inj,Quad PF,6+ Mos 05/01/2015, 05/04/2016, 02/25/2017, 02/17/2018, 03/07/2019, 02/28/2020, 02/17/2021   PFIZER(Purple Top)SARS-COV-2 Vaccination 01/16/2020, 02/13/2020   Tdap 06/03/2017   Zoster Recombinat (Shingrix) 11/21/2019, 02/28/2020    MEDICATIONS/ALLERGIES   Current Meds  Medication Sig   albuterol (VENTOLIN HFA) 108 (90 Base) MCG/ACT inhaler Inhale 2 puffs into the lungs every 6 (six) hours as needed for wheezing or shortness of breath.   baclofen (LIORESAL) 10 MG tablet Take by mouth.   Cholecalciferol 50 MCG (2000 UT) TABS 1 tab by mouth once daily   cyclobenzaprine (FLEXERIL) 5 MG tablet Take 1 tablet by mouth three times daily as needed   divalproex (DEPAKOTE) 500 MG DR tablet Take by mouth.   EMGALITY 120 MG/ML SOAJ Inject into the skin.   gabapentin (NEURONTIN) 100 MG capsule Take  2 capsules (200 mg total) by mouth at bedtime.   metFORMIN (GLUCOPHAGE-XR) 500 MG 24 hr tablet Take 1 tablet (500 mg total) by mouth daily with breakfast.   omeprazole (PRILOSEC) 20 MG capsule TAKE 1 CAPSULE BY MOUTH EVERY DAY   venlafaxine XR (EFFEXOR XR) 150 MG 24 hr capsule Take 1 capsule (150 mg total) by mouth daily with breakfast.   [DISCONTINUED] amLODipine (NORVASC) 10 MG tablet Take 1 tablet (10 mg total) by mouth daily.   [DISCONTINUED] losartan-hydrochlorothiazide (HYZAAR) 50-12.5 MG tablet Take 1 tablet by mouth daily.    Allergies  Allergen Reactions   Chantix [Varenicline] Nausea Only    SOCIAL HISTORY/FAMILY HISTORY   Reviewed in Epic:  Pertinent findings:  Social History   Tobacco Use   Smoking status: Every Day    Packs/day: 0.50    Years: 20.00    Pack years: 10.00    Types: Cigarettes   Smokeless tobacco: Never  Vaping Use   Vaping Use: Never  used  Substance Use Topics   Alcohol use: Not Currently    Alcohol/week: 5.0 standard drinks    Types: 5 Glasses of wine per week   Drug use: Yes    Frequency: 1.0 times per week    Types: Marijuana   Social History   Social History Narrative   Fun: Puzzles, cats   Denies any religious beliefs effecting health care. Lives with daughter in a one story home.  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  Education: some college.    OBJCTIVE -PE, EKG, labs   Wt Readings from Last 3 Encounters:  06/29/21 242 lb (109.8 kg)  04/10/21 236 lb (107 kg)  03/13/21 237 lb (107.5 kg)    Physical Exam: BP 134/88 (BP Location: Left Arm, Patient Position: Sitting, Cuff Size: Normal)    Pulse (!) 104    Resp 20    Ht 5\' 9"  (1.753 m)    Wt 242 lb (109.8 kg)    BMI 35.74 kg/m  Physical Exam Vitals reviewed.  Constitutional:      Appearance: Normal appearance. She is obese.  HENT:     Head: Normocephalic and atraumatic.  Neck:     Vascular: No carotid bruit or JVD.  Cardiovascular:     Rate and Rhythm: Regular rhythm. Tachycardia present. No extrasystoles are present.    Chest Wall: PMI is not displaced (Unable to assess due to body habitus).     Pulses: Normal pulses.     Heart sounds: S1 normal and S2 normal. Heart sounds are distant. No murmur heard.   No friction rub. No gallop.  Pulmonary:     Effort: Pulmonary effort is normal. No respiratory distress.     Breath sounds: Normal breath sounds.  Chest:     Chest wall: Tenderness (Bilateral lower ribs) present.  Musculoskeletal:        General: No swelling. Normal range of motion.     Cervical back: Normal range of motion and neck supple.     Comments: Very tender right thigh  Skin:    General: Skin is warm and dry.  Neurological:     General: No focal deficit present.     Mental Status: She is alert and oriented to person, place, and time.  Psychiatric:        Mood and Affect: Mood normal.        Behavior: Behavior normal.         Thought Content: Thought content normal.  Judgment: Judgment normal.     Adult ECG Report  Rate: 104 ;  Rhythm: sinus tachycardia; normal axis, intervals durations.  Narrative Interpretation: Stable, just tachycardic  Recent Labs: Reviewed Lab Results  Component Value Date   CHOL 116 02/17/2021   HDL 41.40 02/17/2021   LDLCALC 52 02/17/2021   TRIG 110.0 02/17/2021   CHOLHDL 3 02/17/2021   Lab Results  Component Value Date   CREATININE 0.87 02/17/2021   BUN 14 02/17/2021   NA 140 02/17/2021   K 3.7 02/17/2021   CL 104 02/17/2021   CO2 28 02/17/2021   CBC Latest Ref Rng & Units 08/21/2020 02/28/2020 12/20/2018  WBC 4.0 - 10.5 K/uL 8.9 10.9(H) 12.2(H)  Hemoglobin 12.0 - 15.0 g/dL 13.5 13.8 14.5  Hematocrit 36.0 - 46.0 % 40.0 42.6 43.7  Platelets 150.0 - 400.0 K/uL 221.0 303 283.0    Lab Results  Component Value Date   HGBA1C 7.4 (H) 02/17/2021   Lab Results  Component Value Date   TSH 1.23 08/21/2020    ==================================================  COVID-19 Education: The signs and symptoms of COVID-19 were discussed with the patient and how to seek care for testing (follow up with PCP or arrange E-visit).    I spent a total of 22 minutes with the patient spent in direct patient consultation.  Additional time spent with chart review  / charting (studies, outside notes, etc): 10 min Total Time: 32 min  Current medicines are reviewed at length with the patient today.  (+/- concerns) n/a  This visit occurred during the SARS-CoV-2 public health emergency.  Safety protocols were in place, including screening questions prior to the visit, additional usage of staff PPE, and extensive cleaning of exam room while observing appropriate contact time as indicated for disinfecting solutions.  Notice: This dictation was prepared with Dragon dictation along with smart phrase technology. Any transcriptional errors that result from this process are unintentional and may not  be corrected upon review.  Studies Ordered:   Orders Placed This Encounter  Procedures   EKG 12-Lead    Patient Instructions / Medication Changes & Studies & Tests Ordered   Patient Instructions  Medication Instructions:  No changes  Medication refilled  *If you need a refill on your cardiac medications before your next appointment, please call your pharmacy*   Lab Work: Not needed   Testing/Procedures: Not needed   Follow-Up: At [facility], you and your health needs are our priority.  As part of our continuing mission to provide you with exceptional heart care, we have created designated Provider Care Teams.  These Care Teams include your primary Cardiologist (physician) and Advanced Practice Providers (APPs -  Physician Assistants and Nurse Practitioners) who all work together to provide you with the care you need, when you need it.  We recommend signing up for the patient portal called "MyChart".  Sign up information is provided on this After Visit Summary.  MyChart is used to connect with patients for Virtual Visits (Telemedicine).  Patients are able to view lab/test results, encounter notes, upcoming appointments, etc.  Non-urgent messages can be sent to your provider as well.   To learn more about what you can do with MyChart, go to [email or web address].    Your next appointment:   12 month(s)  The format for your next appointment:   In Person  Provider:   Glenetta Hew, MD          Glenetta Hew, M.D., M.S. Interventional Cardiologist   Pager # [phone]  Phone # [phone] [address] [place], [place] [address]   Thank you for choosing Heartcare at [facility]!!

## 2021-06-29 NOTE — Patient Instructions (Signed)
Medication Instructions:   No changes Medication refilled   *If you need a refill on your cardiac medications before your next appointment, please call your pharmacy*   Lab Work:  Not needed     Testing/Procedures:  Not needed  Follow-Up: At [facility], you and your health needs are our priority.  As part of our continuing mission to provide you with exceptional heart care, we have created designated Provider Care Teams.  These Care Teams include your primary Cardiologist (physician) and Advanced Practice Providers (APPs -  Physician Assistants and Nurse Practitioners) who all work together to provide you with the care you need, when you need it.  We recommend signing up for the patient portal called "MyChart".  Sign up information is provided on this After Visit Summary.  MyChart is used to connect with patients for Virtual Visits (Telemedicine).  Patients are able to view lab/test results, encounter notes, upcoming appointments, etc.  Non-urgent messages can be sent to your provider as well.   To learn more about what you can do with MyChart, go to [email or web address].    Your next appointment:   12 month(s)  The format for your next appointment:   In Person  Provider:   David Harding, MD     

## 2021-06-29 NOTE — Assessment & Plan Note (Signed)
Unfortunate, she actually gained weight over the holidays.  We talk about the importance of getting back into her dietary modification regimen and increasing her walking.  Musculoskeletal pain is really limited her walking that much.

## 2021-06-29 NOTE — Assessment & Plan Note (Signed)
LDL again 52.  Doing well on 10 mg atorvastatin.

## 2021-06-30 ENCOUNTER — Encounter: Payer: Self-pay | Admitting: Cardiology

## 2021-07-03 ENCOUNTER — Other Ambulatory Visit: Payer: Self-pay | Admitting: Internal Medicine

## 2021-07-21 ENCOUNTER — Encounter: Payer: Self-pay | Admitting: Internal Medicine

## 2021-07-22 ENCOUNTER — Other Ambulatory Visit: Payer: Self-pay | Admitting: Sports Medicine

## 2021-07-22 ENCOUNTER — Telehealth: Payer: Self-pay | Admitting: Sports Medicine

## 2021-07-22 DIAGNOSIS — M2550 Pain in unspecified joint: Secondary | ICD-10-CM

## 2021-07-22 DIAGNOSIS — M5010 Cervical disc disorder with radiculopathy, unspecified cervical region: Secondary | ICD-10-CM

## 2021-07-22 NOTE — Telephone Encounter (Signed)
New referral for rheumatology was sent in

## 2021-07-22 NOTE — Telephone Encounter (Signed)
Patient had her appointment with [facility] this morning but they do not accept her insurance so the patient was not seen. She asked if she could be referred to another office?  Please advise.

## 2021-07-29 ENCOUNTER — Telehealth: Payer: Self-pay | Admitting: Sports Medicine

## 2021-07-29 ENCOUNTER — Other Ambulatory Visit: Payer: Self-pay | Admitting: Surgery

## 2021-07-29 DIAGNOSIS — C50919 Malignant neoplasm of unspecified site of unspecified female breast: Secondary | ICD-10-CM

## 2021-07-29 NOTE — Telephone Encounter (Signed)
Pt called, the Rheum we referred her to does not accept her insurance. Please refer to [facility] in [facility]. Fax [phone] Pt has already spoken to that office and they are awaiting our referral in order to schedule her.  FYI Pt was informed the insurance she has chosen for 2023 does not participate with [facility].

## 2021-07-29 NOTE — Telephone Encounter (Signed)
Referral faxed

## 2021-08-03 ENCOUNTER — Other Ambulatory Visit: Payer: Self-pay | Admitting: Internal Medicine

## 2021-08-19 ENCOUNTER — Ambulatory Visit: Payer: 59 | Admitting: Internal Medicine

## 2021-09-01 ENCOUNTER — Other Ambulatory Visit: Payer: Self-pay | Admitting: Internal Medicine

## 2021-09-02 ENCOUNTER — Other Ambulatory Visit: Payer: Self-pay | Admitting: Internal Medicine

## 2021-09-18 ENCOUNTER — Encounter: Payer: Self-pay | Admitting: Internal Medicine

## 2021-09-18 ENCOUNTER — Ambulatory Visit (INDEPENDENT_AMBULATORY_CARE_PROVIDER_SITE_OTHER): Payer: 59 | Admitting: Internal Medicine

## 2021-09-18 VITALS — BP 132/78 | HR 92 | Temp 98.0°F | Ht 69.0 in | Wt 238.0 lb

## 2021-09-18 DIAGNOSIS — R55 Syncope and collapse: Secondary | ICD-10-CM

## 2021-09-18 DIAGNOSIS — E559 Vitamin D deficiency, unspecified: Secondary | ICD-10-CM | POA: Diagnosis not present

## 2021-09-18 DIAGNOSIS — E7849 Other hyperlipidemia: Secondary | ICD-10-CM | POA: Diagnosis not present

## 2021-09-18 DIAGNOSIS — E1165 Type 2 diabetes mellitus with hyperglycemia: Secondary | ICD-10-CM

## 2021-09-18 DIAGNOSIS — M1711 Unilateral primary osteoarthritis, right knee: Secondary | ICD-10-CM

## 2021-09-18 DIAGNOSIS — E119 Type 2 diabetes mellitus without complications: Secondary | ICD-10-CM | POA: Insufficient documentation

## 2021-09-18 DIAGNOSIS — Z23 Encounter for immunization: Secondary | ICD-10-CM | POA: Diagnosis not present

## 2021-09-18 DIAGNOSIS — R739 Hyperglycemia, unspecified: Secondary | ICD-10-CM | POA: Diagnosis not present

## 2021-09-18 DIAGNOSIS — E538 Deficiency of other specified B group vitamins: Secondary | ICD-10-CM

## 2021-09-18 DIAGNOSIS — Z0001 Encounter for general adult medical examination with abnormal findings: Secondary | ICD-10-CM | POA: Diagnosis not present

## 2021-09-18 HISTORY — DX: Type 2 diabetes mellitus without complications: E11.9

## 2021-09-18 LAB — URINALYSIS, ROUTINE W REFLEX MICROSCOPIC
Bilirubin Urine: NEGATIVE
Hgb urine dipstick: NEGATIVE
Ketones, ur: NEGATIVE
Leukocytes,Ua: NEGATIVE
Nitrite: NEGATIVE
Specific Gravity, Urine: 1.025 (ref 1.000–1.030)
Total Protein, Urine: NEGATIVE
Urine Glucose: NEGATIVE
Urobilinogen, UA: 0.2 (ref 0.0–1.0)
pH: 5.5 (ref 5.0–8.0)

## 2021-09-18 LAB — MICROALBUMIN / CREATININE URINE RATIO
Creatinine,U: 159.2 mg/dL
Microalb Creat Ratio: 0.5 mg/g (ref 0.0–30.0)
Microalb, Ur: 0.8 mg/dL (ref 0.0–1.9)

## 2021-09-18 LAB — CBC WITH DIFFERENTIAL/PLATELET
Basophils Absolute: 0.1 10*3/uL (ref 0.0–0.1)
Basophils Relative: 0.7 % (ref 0.0–3.0)
Eosinophils Absolute: 0.1 10*3/uL (ref 0.0–0.7)
Eosinophils Relative: 1 % (ref 0.0–5.0)
HCT: 43.2 % (ref 36.0–46.0)
Hemoglobin: 14 g/dL (ref 12.0–15.0)
Lymphocytes Relative: 31.5 % (ref 12.0–46.0)
Lymphs Abs: 3.5 10*3/uL (ref 0.7–4.0)
MCHC: 32.5 g/dL (ref 30.0–36.0)
MCV: 88.6 fl (ref 78.0–100.0)
Monocytes Absolute: 0.8 10*3/uL (ref 0.1–1.0)
Monocytes Relative: 6.8 % (ref 3.0–12.0)
Neutro Abs: 6.7 10*3/uL (ref 1.4–7.7)
Neutrophils Relative %: 60 % (ref 43.0–77.0)
Platelets: 285 10*3/uL (ref 150.0–400.0)
RBC: 4.88 Mil/uL (ref 3.87–5.11)
RDW: 13.4 % (ref 11.5–15.5)
WBC: 11.1 10*3/uL — ABNORMAL HIGH (ref 4.0–10.5)

## 2021-09-18 LAB — HEPATIC FUNCTION PANEL
ALT: 24 U/L (ref 0–35)
AST: 22 U/L (ref 0–37)
Albumin: 4.7 g/dL (ref 3.5–5.2)
Alkaline Phosphatase: 73 U/L (ref 39–117)
Bilirubin, Direct: 0.1 mg/dL (ref 0.0–0.3)
Total Bilirubin: 0.6 mg/dL (ref 0.2–1.2)
Total Protein: 7.2 g/dL (ref 6.0–8.3)

## 2021-09-18 LAB — LIPID PANEL
Cholesterol: 132 mg/dL (ref 0–200)
HDL: 45 mg/dL (ref >39.00)
LDL Cholesterol: 67 mg/dL (ref 0–99)
NonHDL: 86.8
Total CHOL/HDL Ratio: 3
Triglycerides: 100 mg/dL (ref 0.0–149.0)
VLDL: 20 mg/dL (ref 0.0–40.0)

## 2021-09-18 LAB — BASIC METABOLIC PANEL
BUN: 14 mg/dL (ref 6–23)
CO2: 28 mEq/L (ref 19–32)
Calcium: 10.3 mg/dL (ref 8.4–10.5)
Chloride: 101 mEq/L (ref 96–112)
Creatinine, Ser: 0.91 mg/dL (ref 0.40–1.20)
GFR: 68.9 mL/min (ref >60.00)
Glucose, Bld: 186 mg/dL — ABNORMAL HIGH (ref 70–99)
Potassium: 3.9 mEq/L (ref 3.5–5.1)
Sodium: 137 mEq/L (ref 135–145)

## 2021-09-18 LAB — VITAMIN D 25 HYDROXY (VIT D DEFICIENCY, FRACTURES): VITD: 39.46 ng/mL (ref 30.00–100.00)

## 2021-09-18 LAB — TSH: TSH: 1.39 u[IU]/mL (ref 0.35–5.50)

## 2021-09-18 LAB — VITAMIN B12: Vitamin B-12: 385 pg/mL (ref 211–911)

## 2021-09-18 LAB — HEMOGLOBIN A1C: Hgb A1c MFr Bld: 7.7 % — ABNORMAL HIGH (ref 4.6–6.5)

## 2021-09-18 NOTE — Progress Notes (Addendum)
Patient ID: Jennifer Waters, female   DOB: April 07, 1961, 61 y.o.   MRN: [id number]         Chief Complaint:: wellness exam and Follow-up  Right knee pain, dm, recent syncope, low vit d       HPI:  Jennifer Waters is a 61 y.o. female here for wellness exam; pt plans for optho appt and mammogram herself; for prevnar today, o/w up to date                Also not taking the metformin due to some confusion about the med.  Vit D  - taking 2 now.  Pt denies chest pain, increased sob or doe, wheezing, orthopnea, PND, increased LE swelling, palpitations, dizziness but did have an episode of witnessed syncope last wk x 1 after bending to pick up then passed out with rising up.  Out for only seconds, no siezure per daughter.  Pt denies polydipsia, polyuria, or new focal neuro s/s.    Pt denies fever, wt loss, night sweats, loss of appetite, or other constitutional symptoms    Also has recurring several months of worsening right anterior knee pain without swelling, but worse to standing up, better to sit, overall mild, intermittent.  Not taking Vit D.   Wt Readings from Last 3 Encounters:  09/18/21 238 lb (108 kg)  06/29/21 242 lb (109.8 kg)  04/10/21 236 lb (107 kg)   BP Readings from Last 3 Encounters:  09/18/21 132/78  06/29/21 134/88  06/24/21 127/83   Immunization History  Administered Date(s) Administered   Influenza,inj,Quad PF,6+ Mos 05/01/2015, 05/04/2016, 02/25/2017, 02/17/2018, 03/07/2019, 02/28/2020, 02/17/2021   PFIZER(Purple Top)SARS-COV-2 Vaccination 01/16/2020, 02/13/2020   PNEUMOCOCCAL CONJUGATE-20 09/18/2021   Tdap 06/03/2017   Zoster Recombinat (Shingrix) 11/21/2019, 02/28/2020  There are no preventive care reminders to display for this patient.    Past Medical History:  Diagnosis Date   Arthritis    Depression    Hypertension    Migraines    Obesity    Past Surgical History:  Procedure Laterality Date   ABDOMINAL HYSTERECTOMY     BREAST MASS EXCISION     benign  done as a child   TRANSTHORACIC ECHOCARDIOGRAM  12/2018    EF 55-60%.  Impaired relaxation but indeterminate filling pressures.  Normal RV size and function.  Normal valves.  Normal aortic root.    reports that she quit smoking about 3 months ago. Her smoking use included cigarettes. She has a 10.00 pack-year smoking history. She has never used smokeless tobacco. She reports that she does not currently use alcohol after a past usage of about 5.0 standard drinks per week. She reports current drug use. Frequency: 1.00 time per week. Drug: Marijuana. family history includes Arrhythmia in her sister; Arthritis in her mother and sister; Brain cancer in her brother; Diabetes in her brother; Edema in her sister; Lung cancer in her father; Migraines in her mother; Ovarian cancer in her mother. Allergies  Allergen Reactions   Chantix [Varenicline] Nausea Only   Current Outpatient Medications on File Prior to Visit  Medication Sig Dispense Refill   albuterol (VENTOLIN HFA) 108 (90 Base) MCG/ACT inhaler Inhale 2 puffs into the lungs every 6 (six) hours as needed for wheezing or shortness of breath. 6.7 g 2   amLODipine (NORVASC) 10 MG tablet Take 1 tablet (10 mg total) by mouth daily. 90 tablet 3   atorvastatin (LIPITOR) 10 MG tablet Take 1 tablet (10 mg total) by mouth daily. 90  tablet 3   cetirizine (ZYRTEC) 10 MG tablet 1 tablet     Cholecalciferol 50 MCG (2000 UT) TABS 1 tab by mouth once daily 30 tablet 99   cyclobenzaprine (FLEXERIL) 5 MG tablet Take 1 tablet by mouth three times daily as needed 90 tablet 0   fluticasone (FLONASE) 50 MCG/ACT nasal spray 1 spray in each nostril     Ibuprofen-Famotidine 800-26.6 MG TABS 1 tablet     losartan-hydrochlorothiazide (HYZAAR) 50-12.5 MG tablet Take 1 tablet by mouth daily. 90 tablet 3   omeprazole (PRILOSEC) 20 MG capsule TAKE 1 CAPSULE BY MOUTH EVERY DAY 90 capsule 0   rizatriptan (MAXALT) 10 MG tablet Take by mouth.     valACYclovir (VALTREX) 500 MG  tablet 1 tablet     venlafaxine XR (EFFEXOR XR) 150 MG 24 hr capsule Take 1 capsule (150 mg total) by mouth daily with breakfast. 90 capsule 3   divalproex (DEPAKOTE) 500 MG DR tablet Take by mouth. (Patient not taking: Reported on 09/18/2021)     EMGALITY 120 MG/ML SOAJ Inject into the skin. (Patient not taking: Reported on 09/18/2021)     gabapentin (NEURONTIN) 100 MG capsule Take 2 capsules (200 mg total) by mouth at bedtime. (Patient not taking: Reported on 09/18/2021) 180 capsule 1   No current facility-administered medications on file prior to visit.        ROS:  All others reviewed and negative.  Objective        PE:  BP 132/78 (BP Location: Right Arm, Patient Position: Sitting, Cuff Size: Large)   Pulse 92   Temp 98 F (36.7 C) (Oral)   Ht 5\' 9"  (1.753 m)   Wt 238 lb (108 kg)   SpO2 94%   BMI 35.15 kg/m                 Constitutional: Pt appears in NAD               HENT: Head: NCAT.                Right Ear: External ear normal.                 Left Ear: External ear normal.                Eyes: . Pupils are equal, round, and reactive to light. Conjunctivae and EOM are normal               Nose: without d/c or deformity               Neck: Neck supple. Gross normal ROM               Cardiovascular: Normal rate and regular rhythm.                 Pulmonary/Chest: Effort normal and breath sounds without rales or wheezing.                Abd:  Soft, NT, ND, + BS, no organomegaly               Neurological: Pt is alert. At baseline orientation, motor grossly intact               Skin: Skin is warm. No rashes, no other new lesions, LE edema -                Psychiatric: Pt behavior is normal without agitation   Micro: none  Cardiac  tracings I have personally interpreted today:  ECG - NSR 88  Pertinent Radiological findings (summarize): none   Lab Results  Component Value Date   WBC 11.1 (H) 09/18/2021   HGB 14.0 09/18/2021   HCT 43.2 09/18/2021   PLT 285.0 09/18/2021    GLUCOSE 186 (H) 09/18/2021   CHOL 132 09/18/2021   TRIG 100.0 09/18/2021   HDL 45.00 09/18/2021   LDLCALC 67 09/18/2021   ALT 24 09/18/2021   AST 22 09/18/2021   NA 137 09/18/2021   K 3.9 09/18/2021   CL 101 09/18/2021   CREATININE 0.91 09/18/2021   BUN 14 09/18/2021   CO2 28 09/18/2021   TSH 1.39 09/18/2021   HGBA1C 7.7 (H) 09/18/2021   MICROALBUR 0.8 09/18/2021   Assessment/Plan:  Jennifer Waters is a 61 y.o. Black or African American [2] female with  has a past medical history of Arthritis, Depression, Hypertension, Migraines, and Obesity.  Vitamin D deficiency Last vitamin D Lab Results  Component Value Date   VD25OH 28.92 (L) 02/17/2021   Low, to start oral replacement   Encounter for well adult exam with abnormal findings Age and sex appropriate education and counseling updated with regular exercise and diet Referrals for preventative services - pt will call for eye exam and mammogram Immunizations addressed - for prevnar 20 today Smoking counseling  - none needed - quit x 3 mo Evidence for depression or other mood disorder - none significant Most recent labs reviewed. I have personally reviewed and have noted: 1) the patient's medical and social history 2) The patient's current medications and supplements 3) The patient's height, weight, and BMI have been recorded in the chart   Patellofemoral arthritis of right knee With increased pain, for sport med referral  Hyperlipidemia due to dietary fat intake Lab Results  Component Value Date   LDLCALC 67 09/18/2021   Stable, pt to continue current statin lipitor 10   Hyperglycemia Lab Results  Component Value Date   HGBA1C 7.7 (H) 09/18/2021   uncontrolled, pt to restart metformin, DM diet   Diabetes (HCC) Lab Results  Component Value Date   HGBA1C 7.7 (H) 09/18/2021   Stable, pt to restart medical treatment  - to restart metformin   Syncope Etiology unclear, ecg reviewed, pt declines other  eval for now such as cxr, carotids, echo, mri brain or cards/neuro referral  Followup: Return in about 6 months (around 03/20/2022).  Oliver Barre, MD 09/19/2021 8:03 PM [facility]

## 2021-09-18 NOTE — Patient Instructions (Signed)
You had the Prevnar pneumonia shot today ? ?Your EKG was done today ? ?Please continue all other medications as before, and refills have been done if requested. ? ?Please have the pharmacy call with any other refills you may need. ? ?Please continue your efforts at being more active, low cholesterol diet, and weight control. ? ?You are otherwise up to date with prevention measures today. ? ?Please keep your appointments with your specialists as you may have planned ? ?Please go to the LAB at the blood drawing area for the tests to be done ? ?You will be contacted by phone if any changes need to be made immediately.  Otherwise, you will receive a letter about your results with an explanation, but please check with MyChart first. ? ?Please remember to sign up for MyChart if you have not done so, as this will be important to you in the future with finding out test results, communicating by private email, and scheduling acute appointments online when needed. ? ?Please make an Appointment to return in 6 months, or sooner if needed ?

## 2021-09-18 NOTE — Assessment & Plan Note (Signed)
Last vitamin D ?Lab Results  ?Component Value Date  ? VD25OH 28.92 (L) 02/17/2021  ? ?Low, to start oral replacement ? ?

## 2021-09-19 ENCOUNTER — Encounter: Payer: Self-pay | Admitting: Internal Medicine

## 2021-09-19 NOTE — Assessment & Plan Note (Signed)
Lab Results  ?Component Value Date  ? [place] 67 09/18/2021  ? ?Stable, pt to continue current statin lipitor 10 ? ?

## 2021-09-19 NOTE — Assessment & Plan Note (Addendum)
Etiology unclear, ecg reviewed, pt declines other eval for now such as cxr, carotids, echo, mri brain or cards/neuro referral ?

## 2021-09-19 NOTE — Assessment & Plan Note (Signed)
With increased pain, for sport med referral ?

## 2021-09-19 NOTE — Assessment & Plan Note (Signed)
Lab Results  ?Component Value Date  ? HGBA1C 7.7 (H) 09/18/2021  ? ?uncontrolled, pt to restart metformin, DM diet ? ?

## 2021-09-19 NOTE — Assessment & Plan Note (Signed)
Age and sex appropriate education and counseling updated with regular exercise and diet ?Referrals for preventative services - pt will call for eye exam and mammogram ?Immunizations addressed - for prevnar 20 today ?Smoking counseling  - none needed - quit x 3 mo ?Evidence for depression or other mood disorder - none significant ?Most recent labs reviewed. ?I have personally reviewed and have noted: ?1) the patient's medical and social history ?2) The patient's current medications and supplements ?3) The patient's height, weight, and BMI have been recorded in the chart ? ?

## 2021-09-19 NOTE — Assessment & Plan Note (Signed)
Lab Results  ?Component Value Date  ? HGBA1C 7.7 (H) 09/18/2021  ? ?Stable, pt to restart medical treatment  - to restart metformin ? ?

## 2021-09-20 ENCOUNTER — Other Ambulatory Visit: Payer: Self-pay | Admitting: Internal Medicine

## 2021-09-20 ENCOUNTER — Encounter: Payer: Self-pay | Admitting: Internal Medicine

## 2021-09-20 MED ORDER — METFORMIN HCL ER 500 MG PO TB24: 500.0000 mg | ORAL_TABLET | Freq: Every day | ORAL | 3 refills | Status: DC

## 2021-09-22 ENCOUNTER — Telehealth: Payer: Self-pay

## 2021-09-22 MED ORDER — CYCLOBENZAPRINE HCL 5 MG PO TABS: 5.0000 mg | ORAL_TABLET | Freq: Three times a day (TID) | ORAL | 0 refills | Status: DC | PRN

## 2021-09-22 NOTE — Telephone Encounter (Signed)
Ok to send refill then, I think she just wasn't aware of the mar refill already done ?

## 2021-09-22 NOTE — Telephone Encounter (Signed)
Patient is requesting refill of Flexeril muscle relaxer. Per patient, has had an increase in body cramps and has taken extra pills due to pain. Patient does not have Rheumatology appt until June and is requesting refill. Last filled 09/02/21 ?

## 2021-09-22 NOTE — Telephone Encounter (Signed)
No refills shown and per patient down to two pills ?

## 2021-09-22 NOTE — Telephone Encounter (Signed)
Already done mar 15 with some refills, I think should still be ok ?

## 2021-09-22 NOTE — Addendum Note (Signed)
Addended by: Terence Lux A on: 09/22/2021 01:46 PM ? ? Modules accepted: Orders ? ?

## 2021-09-23 ENCOUNTER — Other Ambulatory Visit: Payer: Self-pay | Admitting: Internal Medicine

## 2021-10-13 DIAGNOSIS — Z1231 Encounter for screening mammogram for malignant neoplasm of breast: Secondary | ICD-10-CM | POA: Diagnosis not present

## 2021-10-13 LAB — HM MAMMOGRAPHY

## 2021-11-18 ENCOUNTER — Other Ambulatory Visit: Payer: Self-pay | Admitting: Internal Medicine

## 2021-12-08 DIAGNOSIS — R768 Other specified abnormal immunological findings in serum: Secondary | ICD-10-CM | POA: Diagnosis not present

## 2021-12-08 DIAGNOSIS — M542 Cervicalgia: Secondary | ICD-10-CM | POA: Diagnosis not present

## 2021-12-08 DIAGNOSIS — Z6834 Body mass index (BMI) 34.0-34.9, adult: Secondary | ICD-10-CM | POA: Diagnosis not present

## 2021-12-08 DIAGNOSIS — R252 Cramp and spasm: Secondary | ICD-10-CM | POA: Diagnosis not present

## 2021-12-08 DIAGNOSIS — M1991 Primary osteoarthritis, unspecified site: Secondary | ICD-10-CM | POA: Diagnosis not present

## 2021-12-08 DIAGNOSIS — E669 Obesity, unspecified: Secondary | ICD-10-CM | POA: Diagnosis not present

## 2021-12-08 DIAGNOSIS — R5383 Other fatigue: Secondary | ICD-10-CM | POA: Diagnosis not present

## 2021-12-08 DIAGNOSIS — M545 Low back pain, unspecified: Secondary | ICD-10-CM | POA: Diagnosis not present

## 2021-12-20 ENCOUNTER — Other Ambulatory Visit: Payer: Self-pay | Admitting: Internal Medicine

## 2021-12-23 ENCOUNTER — Telehealth: Payer: Self-pay | Admitting: Internal Medicine

## 2021-12-23 NOTE — Telephone Encounter (Signed)
Patient called stating that she had her referral appointment with the Rheumatologist and they stated that everything she needs can be handled by [name]. Patient would like [name] to review the note from the Rheumatologist and let her know if she needs to come in and discuss things with him. Call back number is [phone].  **Note from Rheumatologist is in the chart.**

## 2021-12-23 NOTE — Telephone Encounter (Signed)
Ok sounds good, note reviewed, and we can f/u at her next appt

## 2021-12-24 NOTE — Telephone Encounter (Signed)
Patient scheduled an appointment 12/25/21 for toe fungus and to discuss notes from rheumatology

## 2021-12-25 ENCOUNTER — Ambulatory Visit: Payer: Self-pay | Admitting: Internal Medicine

## 2022-01-20 ENCOUNTER — Other Ambulatory Visit: Payer: Self-pay | Admitting: Internal Medicine

## 2022-02-21 ENCOUNTER — Other Ambulatory Visit: Payer: Self-pay | Admitting: Internal Medicine

## 2022-02-23 ENCOUNTER — Other Ambulatory Visit: Payer: Self-pay | Admitting: Internal Medicine

## 2022-02-26 ENCOUNTER — Encounter: Payer: Self-pay | Admitting: Gastroenterology

## 2022-03-19 ENCOUNTER — Ambulatory Visit (INDEPENDENT_AMBULATORY_CARE_PROVIDER_SITE_OTHER): Payer: Commercial Managed Care - HMO | Admitting: Internal Medicine

## 2022-03-19 VITALS — BP 116/64 | HR 107 | Ht 69.0 in | Wt 237.0 lb

## 2022-03-19 DIAGNOSIS — E559 Vitamin D deficiency, unspecified: Secondary | ICD-10-CM | POA: Diagnosis not present

## 2022-03-19 DIAGNOSIS — B351 Tinea unguium: Secondary | ICD-10-CM

## 2022-03-19 DIAGNOSIS — Z23 Encounter for immunization: Secondary | ICD-10-CM

## 2022-03-19 DIAGNOSIS — M2550 Pain in unspecified joint: Secondary | ICD-10-CM | POA: Diagnosis not present

## 2022-03-19 DIAGNOSIS — R252 Cramp and spasm: Secondary | ICD-10-CM | POA: Insufficient documentation

## 2022-03-19 DIAGNOSIS — E1165 Type 2 diabetes mellitus with hyperglycemia: Secondary | ICD-10-CM | POA: Diagnosis not present

## 2022-03-19 DIAGNOSIS — E7849 Other hyperlipidemia: Secondary | ICD-10-CM

## 2022-03-19 DIAGNOSIS — M1991 Primary osteoarthritis, unspecified site: Secondary | ICD-10-CM | POA: Insufficient documentation

## 2022-03-19 DIAGNOSIS — I10 Essential (primary) hypertension: Secondary | ICD-10-CM

## 2022-03-19 MED ORDER — TERBINAFINE HCL 250 MG PO TABS: 250.0000 mg | ORAL_TABLET | Freq: Every day | ORAL | 0 refills | Status: DC

## 2022-03-19 MED ORDER — MELOXICAM 15 MG PO TABS: 15.0000 mg | ORAL_TABLET | Freq: Every day | ORAL | 3 refills | Status: DC | PRN

## 2022-03-19 MED ORDER — OZEMPIC (0.25 OR 0.5 MG/DOSE) 2 MG/3ML ~~LOC~~ SOPN: PEN_INJECTOR | SUBCUTANEOUS | 11 refills | Status: DC

## 2022-03-19 NOTE — Progress Notes (Unsigned)
Patient ID: Jennifer Waters, female   DOB: 03-Jan-1961, 61 y.o.   MRN: [id number]        Chief Complaint: follow up HTN, HLD and hyperglycemia ***       HPI:  Jennifer Waters is a 61 y.o. female here with c/o          Wt Readings from Last 3 Encounters:  03/19/22 237 lb (107.5 kg)  09/18/21 238 lb (108 kg)  06/29/21 242 lb (109.8 kg)   BP Readings from Last 3 Encounters:  03/19/22 116/64  09/18/21 132/78  06/29/21 134/88         Past Medical History:  Diagnosis Date   Arthritis    Depression    Hypertension    Migraines    Obesity    Past Surgical History:  Procedure Laterality Date   ABDOMINAL HYSTERECTOMY     BREAST MASS EXCISION     benign done as a child   TRANSTHORACIC ECHOCARDIOGRAM  12/2018    EF 55-60%.  Impaired relaxation but indeterminate filling pressures.  Normal RV size and function.  Normal valves.  Normal aortic root.    reports that she quit smoking about 9 months ago. Her smoking use included cigarettes. She has a 10.00 pack-year smoking history. She has never used smokeless tobacco. She reports that she does not currently use alcohol after a past usage of about 5.0 standard drinks of alcohol per week. She reports current drug use. Frequency: 1.00 time per week. Drug: Marijuana. family history includes Arrhythmia in her sister; Arthritis in her mother and sister; Brain cancer in her brother; Diabetes in her brother; Edema in her sister; Lung cancer in her father; Migraines in her mother; Ovarian cancer in her mother. Allergies  Allergen Reactions   Chantix [Varenicline] Nausea Only   Current Outpatient Medications on File Prior to Visit  Medication Sig Dispense Refill   albuterol (VENTOLIN HFA) 108 (90 Base) MCG/ACT inhaler Inhale 2 puffs into the lungs every 6 (six) hours as needed for wheezing or shortness of breath. 6.7 g 2   amLODipine (NORVASC) 10 MG tablet Take 1 tablet (10 mg total) by mouth daily. 90 tablet 3   cetirizine (ZYRTEC) 10 MG  tablet 1 tablet     Cholecalciferol 50 MCG (2000 UT) TABS 1 tab by mouth once daily 30 tablet 99   cyclobenzaprine (FLEXERIL) 5 MG tablet Take 1 tablet by mouth three times daily as needed 90 tablet 0   divalproex (DEPAKOTE) 500 MG DR tablet Take by mouth.     EMGALITY 120 MG/ML SOAJ Inject into the skin.     fluticasone (FLONASE) 50 MCG/ACT nasal spray 1 spray in each nostril     gabapentin (NEURONTIN) 100 MG capsule Take 2 capsules (200 mg total) by mouth at bedtime. 180 capsule 1   Ibuprofen-Famotidine 800-26.6 MG TABS 1 tablet     losartan-hydrochlorothiazide (HYZAAR) 50-12.5 MG tablet Take 1 tablet by mouth daily. 90 tablet 3   metFORMIN (GLUCOPHAGE-XR) 500 MG 24 hr tablet Take 1 tablet (500 mg total) by mouth daily with breakfast. 90 tablet 3   omeprazole (PRILOSEC) 20 MG capsule TAKE 1 CAPSULE BY MOUTH EVERY DAY 90 capsule 0   rizatriptan (MAXALT) 10 MG tablet Take by mouth.     valACYclovir (VALTREX) 500 MG tablet 1 tablet     venlafaxine XR (EFFEXOR-XR) 150 MG 24 hr capsule TAKE 1 CAPSULE BY MOUTH ONCE DAILY WITH BREAKFAST 90 capsule 3   Acetaminophen 500 MG capsule  1 capsule as needed Orally every 6 hrs     atorvastatin (LIPITOR) 10 MG tablet Take 1 tablet (10 mg total) by mouth daily. 90 tablet 3   No current facility-administered medications on file prior to visit.        ROS:  All others reviewed and negative.  Objective        PE:  BP 116/64 (BP Location: Left Arm, Patient Position: Sitting, Cuff Size: Large)   Pulse (!) 107   Ht '5\' 9"'$  (1.753 m)   Wt 237 lb (107.5 kg)   SpO2 96%   BMI 35.00 kg/m                 Constitutional: Pt appears in NAD               HENT: Head: NCAT.                Right Ear: External ear normal.                 Left Ear: External ear normal.                Eyes: . Pupils are equal, round, and reactive to light. Conjunctivae and EOM are normal               Nose: without d/c or deformity               Neck: Neck supple. Gross normal ROM                Cardiovascular: Normal rate and regular rhythm.                 Pulmonary/Chest: Effort normal and breath sounds without rales or wheezing.                Abd:  Soft, NT, ND, + BS, no organomegaly               Neurological: Pt is alert. At baseline orientation, motor grossly intact               Skin: Skin is warm. No rashes, no other new lesions, LE edema - ***               Psychiatric: Pt behavior is normal without agitation   Micro: none  Cardiac tracings I have personally interpreted today:  none  Pertinent Radiological findings (summarize): none   Lab Results  Component Value Date   WBC 11.1 (H) 09/18/2021   HGB 14.0 09/18/2021   HCT 43.2 09/18/2021   PLT 285.0 09/18/2021   GLUCOSE 186 (H) 09/18/2021   CHOL 132 09/18/2021   TRIG 100.0 09/18/2021   HDL 45.00 09/18/2021   LDLCALC 67 09/18/2021   ALT 24 09/18/2021   AST 22 09/18/2021   NA 137 09/18/2021   K 3.9 09/18/2021   CL 101 09/18/2021   CREATININE 0.91 09/18/2021   BUN 14 09/18/2021   CO2 28 09/18/2021   TSH 1.39 09/18/2021   HGBA1C 7.7 (H) 09/18/2021   MICROALBUR 0.8 09/18/2021   Assessment/Plan:  Jennifer Waters is a 61 y.o. Black or African American [2] female with  has a past medical history of Arthritis, Depression, Hypertension, Migraines, and Obesity.  No problem-specific Assessment & Plan notes found for this encounter.  Followup: No follow-ups on file.  Jennifer Cower, MD 03/19/2022 9:01 AM [facility]

## 2022-03-19 NOTE — Patient Instructions (Addendum)
You had the flu shot today  Ok to stop the metformin  Please take all new medication as prescribed - the low dose ozempic to start; ok to call in 1 month if you are doing well and you want the dose increased for wt loss  Please take all new medication as prescribed - the mobic as needed for pain  Please take all new medication as prescribed - the lamisil for toenails for 12 wks  - and remember to have your lab testing done at the [place] lab at 6 wks  Please continue all other medications as before, and refills have been done if requested.  Please have the pharmacy call with any other refills you may need.  Please continue your efforts at being more active, low cholesterol diet, and weight control  Please keep your appointments with your specialists as you may have planned  Please go to the LAB at the blood drawing area for the tests to be done  You will be contacted by phone if any changes need to be made immediately.  Otherwise, you will receive a letter about your results with an explanation, but please check with MyChart first.  Please remember to sign up for MyChart if you have not done so, as this will be important to you in the future with finding out test results, communicating by private email, and scheduling acute appointments online when needed.  Please make an Appointment to return in 6 months, or sooner if needed

## 2022-03-20 ENCOUNTER — Encounter: Payer: Self-pay | Admitting: Internal Medicine

## 2022-03-20 DIAGNOSIS — M2550 Pain in unspecified joint: Secondary | ICD-10-CM | POA: Insufficient documentation

## 2022-03-20 DIAGNOSIS — B351 Tinea unguium: Secondary | ICD-10-CM | POA: Insufficient documentation

## 2022-03-20 NOTE — Assessment & Plan Note (Signed)
Lab Results  Component Value Date   LDLCALC 67 09/18/2021   Stable, pt to continue current statin litpiro 10 mg qd

## 2022-03-20 NOTE — Assessment & Plan Note (Signed)
Last vitamin D Lab Results  Component Value Date   VD25OH 39.46 09/18/2021   Low, pt reminded to start oral replacement

## 2022-03-20 NOTE — Assessment & Plan Note (Signed)
Mild persistent, for mobic 15 qd prn,  to f/u any worsening symptoms or concerns

## 2022-03-20 NOTE — Assessment & Plan Note (Signed)
BP Readings from Last 3 Encounters:  03/19/22 116/64  09/18/21 132/78  06/29/21 134/88   Stable, pt to continue medical treatment norvasc 10 mg qd, hyzaar 50-12.5 qd

## 2022-03-20 NOTE — Assessment & Plan Note (Signed)
Mult nails, for lamisil 250 qd x 12 wks, with LFTs at 6 wks

## 2022-03-20 NOTE — Assessment & Plan Note (Signed)
Lab Results  Component Value Date   HGBA1C 7.7 (H) 09/18/2021   Uncontrolled, pt declines DM education referral, but to change metformin to ozempic 0.25 mg Lurlean Leyden

## 2022-03-25 ENCOUNTER — Telehealth: Payer: Self-pay

## 2022-03-25 NOTE — Telephone Encounter (Signed)
Pt was advised that she needs a PA for Semaglutide,0.25 or 0.'5MG'$ /DOS, (OZEMPIC, 0.25 OR 0.5 MG/DOSE,) 2 MG/3ML SOPN.  Please advise  Pt is asking for a call back with an update

## 2022-03-29 ENCOUNTER — Telehealth: Payer: Self-pay

## 2022-03-29 NOTE — Telephone Encounter (Signed)
PA started   Key: [id number]  Please wait for [facility] 2017 to return a determination.

## 2022-03-31 NOTE — Telephone Encounter (Signed)
Per cover my meds, waiting for a response from insurance,patient informed.

## 2022-04-21 NOTE — Telephone Encounter (Signed)
PA ([employer])--denied for OZEMPIC .25 OR 0.5 PEN INJ.

## 2022-04-25 ENCOUNTER — Other Ambulatory Visit: Payer: Self-pay | Admitting: Internal Medicine

## 2022-05-24 ENCOUNTER — Other Ambulatory Visit: Payer: Self-pay | Admitting: Internal Medicine

## 2022-05-26 ENCOUNTER — Other Ambulatory Visit: Payer: Self-pay | Admitting: Internal Medicine

## 2022-07-21 ENCOUNTER — Encounter: Payer: Self-pay | Admitting: Nurse Practitioner

## 2022-07-21 ENCOUNTER — Ambulatory Visit: Payer: Medicare Other | Attending: Nurse Practitioner | Admitting: Nurse Practitioner

## 2022-07-21 VITALS — BP 126/80 | HR 85 | Ht 69.0 in | Wt 240.0 lb

## 2022-07-21 DIAGNOSIS — E782 Mixed hyperlipidemia: Secondary | ICD-10-CM

## 2022-07-21 DIAGNOSIS — E669 Obesity, unspecified: Secondary | ICD-10-CM

## 2022-07-21 DIAGNOSIS — R519 Headache, unspecified: Secondary | ICD-10-CM | POA: Diagnosis present

## 2022-07-21 DIAGNOSIS — M2550 Pain in unspecified joint: Secondary | ICD-10-CM | POA: Diagnosis present

## 2022-07-21 DIAGNOSIS — Z720 Tobacco use: Secondary | ICD-10-CM | POA: Diagnosis present

## 2022-07-21 DIAGNOSIS — G8929 Other chronic pain: Secondary | ICD-10-CM

## 2022-07-21 DIAGNOSIS — I10 Essential (primary) hypertension: Secondary | ICD-10-CM | POA: Diagnosis present

## 2022-07-21 MED ORDER — ATORVASTATIN CALCIUM 10 MG PO TABS: 10.0000 mg | ORAL_TABLET | Freq: Every day | ORAL | 3 refills | Status: DC

## 2022-07-21 MED ORDER — LOSARTAN POTASSIUM-HCTZ 50-12.5 MG PO TABS: 1.0000 | ORAL_TABLET | Freq: Every day | ORAL | 3 refills | Status: DC

## 2022-07-21 MED ORDER — AMLODIPINE BESYLATE 10 MG PO TABS: 10.0000 mg | ORAL_TABLET | Freq: Every day | ORAL | 3 refills | Status: DC

## 2022-07-21 NOTE — Patient Instructions (Signed)
Medication Instructions:  Your physician recommends that you continue on your current medications as directed. Please refer to the Current Medication list given to you today.   *If you need a refill on your cardiac medications before your next appointment, please call your pharmacy*   Lab Work: NONE ordered at this time of appointment   If you have labs (blood work) drawn today and your tests are completely normal, you will receive your results only by: MyChart Message (if you have MyChart) OR A paper copy in the mail If you have any lab test that is abnormal or we need to change your treatment, we will call you to review the results.   Testing/Procedures: NONE ordered at this time of appointment     Follow-Up: At [facility], you and your health needs are our priority.  As part of our continuing mission to provide you with exceptional heart care, we have created designated Provider Care Teams.  These Care Teams include your primary Cardiologist (physician) and Advanced Practice Providers (APPs -  Physician Assistants and Nurse Practitioners) who all work together to provide you with the care you need, when you need it.  We recommend signing up for the patient portal called "MyChart".  Sign up information is provided on this After Visit Summary.  MyChart is used to connect with patients for Virtual Visits (Telemedicine).  Patients are able to view lab/test results, encounter notes, upcoming appointments, etc.  Non-urgent messages can be sent to your provider as well.   To learn more about what you can do with MyChart, go to [email or web address].    Your next appointment:   1 year(s)  Provider:   David Harding, MD     Other Instructions   

## 2022-07-21 NOTE — Progress Notes (Signed)
Office Visit    Patient Name: Jennifer Waters Date of Encounter: 07/21/2022  Primary Care Provider:  Biagio Borg, MD Primary Cardiologist:  Glenetta Hew, MD  Chief Complaint    62 year old female with a history of hypertension, hyperlipidemia, obesity, chronic migraines, polyarthralgia with positive ANA, and tobacco use who presents for follow-up related to hypertension.  Past Medical History    Past Medical History:  Diagnosis Date   Arthritis    Depression    Hypertension    Migraines    Obesity    Past Surgical History:  Procedure Laterality Date   ABDOMINAL HYSTERECTOMY     BREAST MASS EXCISION     benign done as a child   TRANSTHORACIC ECHOCARDIOGRAM  12/2018    EF 55-60%.  Impaired relaxation but indeterminate filling pressures.  Normal RV size and function.  Normal valves.  Normal aortic root.    Allergies  Allergies  Allergen Reactions   Chantix [Varenicline] Nausea Only     Labs/Other Studies Reviewed    The following studies were reviewed today: Echo 12/2018: IMPRESSIONS   1. The left ventricle has normal systolic function, with an ejection  fraction of 55-60%. The cavity size was normal. Left ventricular diastolic  Doppler parameters are consistent with impaired relaxation. Indeterminate  filling pressures.   2. The right ventricle has normal systolic function. The cavity was  normal. There is no increase in right ventricular wall thickness. Right  ventricular systolic pressure could not be assessed.   3. The aortic root is normal in size and structure.    Recent Labs: 09/18/2021: ALT 24; BUN 14; Creatinine, Ser 0.91; Hemoglobin 14.0; Platelets 285.0; Potassium 3.9; Sodium 137; TSH 1.39  Recent Lipid Panel    Component Value Date/Time   CHOL 132 09/18/2021 1104   CHOL 196 07/24/2020 1245   TRIG 100.0 09/18/2021 1104   HDL 45.00 09/18/2021 1104   HDL 53 07/24/2020 1245   CHOLHDL 3 09/18/2021 1104   VLDL 20.0 09/18/2021 1104   LDLCALC  67 09/18/2021 1104   LDLCALC 123 (H) 07/24/2020 1245   LDLCALC 119 (H) 02/28/2020 1241    History of Present Illness    62 year old female with the above past medical history including hypertension, hyperlipidemia, obesity, chronic migraines, polyarthralgia with positive ANA, and tobacco use.  Echocardiogram in 2020 revealed EF 55 to 60%, global LV function, normal RV systolic function, no significant valvular abnormalities.  She has followed with cardiology for management of hypertension.  She was last seen 201 03/11/2019.  He was doing well from a cardiac standpoint.  She does have a history of chronic pain and has been diagnosed with polyarthralgia with positive ANA.  She follows with rheumatology.  She also has chronic daily headaches.  She presents today for follow-up.  Since her last visit she has done well from a cardiac standpoint.  She reports that she quit smoking 1 week ago.  She was recently approved for disability.  BP has been well controlled. She is inquiring about a disability placard through the [facility].  Overall, she reports feeling well.  Home Medications    Current Outpatient Medications  Medication Sig Dispense Refill   Acetaminophen 500 MG capsule 1 capsule as needed Orally every 6 hrs     albuterol (VENTOLIN HFA) 108 (90 Base) MCG/ACT inhaler Inhale 2 puffs into the lungs every 6 (six) hours as needed for wheezing or shortness of breath. 6.7 g 2   cetirizine (ZYRTEC) 10 MG tablet 1  tablet     Cholecalciferol 50 MCG (2000 UT) TABS 1 tab by mouth once daily 30 tablet 99   cyclobenzaprine (FLEXERIL) 5 MG tablet Take 1 tablet by mouth three times daily as needed 90 tablet 0   fluticasone (FLONASE) 50 MCG/ACT nasal spray 1 spray in each nostril     meloxicam (MOBIC) 15 MG tablet Take 1 tablet (15 mg total) by mouth daily as needed for pain. 90 tablet 3   metFORMIN (GLUCOPHAGE-XR) 500 MG 24 hr tablet Take 500 mg by mouth every morning.     omeprazole (PRILOSEC) 20 MG capsule TAKE  1 CAPSULE BY MOUTH EVERY DAY 90 capsule 0   rizatriptan (MAXALT) 10 MG tablet Take by mouth.     valACYclovir (VALTREX) 500 MG tablet 1 tablet     venlafaxine XR (EFFEXOR-XR) 150 MG 24 hr capsule TAKE 1 CAPSULE BY MOUTH ONCE DAILY WITH BREAKFAST 90 capsule 3   amLODipine (NORVASC) 10 MG tablet Take 1 tablet (10 mg total) by mouth daily. 90 tablet 3   atorvastatin (LIPITOR) 10 MG tablet Take 1 tablet (10 mg total) by mouth daily. 90 tablet 3   divalproex (DEPAKOTE) 500 MG DR tablet Take by mouth. (Patient not taking: Reported on 07/21/2022)     EMGALITY 120 MG/ML SOAJ Inject into the skin. (Patient not taking: Reported on 07/21/2022)     gabapentin (NEURONTIN) 100 MG capsule Take 2 capsules (200 mg total) by mouth at bedtime. (Patient not taking: Reported on 07/21/2022) 180 capsule 1   losartan-hydrochlorothiazide (HYZAAR) 50-12.5 MG tablet Take 1 tablet by mouth daily. 90 tablet 3   No current facility-administered medications for this visit.     Review of Systems    She denies chest pain, palpitations, dyspnea, pnd, orthopnea, n, v, dizziness, syncope, edema, weight gain, or early satiety. All other systems reviewed and are otherwise negative except as noted above.   Physical Exam    VS:  BP 126/80   Pulse 85   Ht '5\' 9"'$  (1.753 m)   Wt 240 lb (108.9 kg)   SpO2 (!) 85%   BMI 35.44 kg/m   GEN: Well nourished, well developed, in no acute distress. HEENT: normal. Neck: Supple, no JVD, carotid bruits, or masses. Cardiac: RRR, no murmurs, rubs, or gallops. No clubbing, cyanosis, edema.  Radials/DP/PT 2+ and equal bilaterally.  Respiratory:  Respirations regular and unlabored, clear to auscultation bilaterally. GI: Soft, nontender, nondistended, BS + x 4. MS: no deformity or atrophy. Skin: warm and dry, no rash. Neuro:  Strength and sensation are intact. Psych: Normal affect.  Accessory Clinical Findings    ECG personally reviewed by me today - No EKG in office today.    Lab Results   Component Value Date   WBC 11.1 (H) 09/18/2021   HGB 14.0 09/18/2021   HCT 43.2 09/18/2021   MCV 88.6 09/18/2021   PLT 285.0 09/18/2021   Lab Results  Component Value Date   CREATININE 0.91 09/18/2021   BUN 14 09/18/2021   NA 137 09/18/2021   K 3.9 09/18/2021   CL 101 09/18/2021   CO2 28 09/18/2021   Lab Results  Component Value Date   ALT 24 09/18/2021   AST 22 09/18/2021   ALKPHOS 73 09/18/2021   BILITOT 0.6 09/18/2021   Lab Results  Component Value Date   CHOL 132 09/18/2021   HDL 45.00 09/18/2021   LDLCALC 67 09/18/2021   TRIG 100.0 09/18/2021   CHOLHDL 3 09/18/2021    Lab  Results  Component Value Date   HGBA1C 7.7 (H) 09/18/2021    Assessment & Plan    1. Hypertension: BP well controlled. Continue current antihypertensive regimen.   2. Hyperlipidemia: LDL was 67 in 08/2021. Continue Lipitor.   3. Obesity: Encouraged ongoing lifestyle modifications with diet and activity as tolerated.   4. Chronic pain/migraines: Stable. She was recently approved for disability.  I have provided her with a completed form for a disability placard through the [facility] today.  5. Tobacco use: She reports having quit smoking 1 week ago. I congratulated her on this.   6. Disposition: Follow-up in 1 year.      Lenna Sciara, NP 07/21/2022, 9:51 AM

## 2022-07-23 ENCOUNTER — Other Ambulatory Visit: Payer: Self-pay | Admitting: Internal Medicine

## 2022-07-29 ENCOUNTER — Other Ambulatory Visit: Payer: Self-pay

## 2022-07-29 MED ORDER — VENLAFAXINE HCL ER 150 MG PO CP24: 150.0000 mg | ORAL_CAPSULE | Freq: Every day | ORAL | 0 refills | Status: DC

## 2022-08-05 ENCOUNTER — Encounter: Payer: Self-pay | Admitting: Internal Medicine

## 2022-08-17 ENCOUNTER — Other Ambulatory Visit: Payer: Self-pay | Admitting: Internal Medicine

## 2022-09-14 ENCOUNTER — Other Ambulatory Visit: Payer: Self-pay | Admitting: Internal Medicine

## 2022-09-17 ENCOUNTER — Ambulatory Visit: Payer: Commercial Managed Care - HMO | Admitting: Internal Medicine

## 2022-09-20 ENCOUNTER — Telehealth: Payer: Self-pay | Admitting: Internal Medicine

## 2022-09-20 MED ORDER — CYCLOBENZAPRINE HCL 5 MG PO TABS: 5.0000 mg | ORAL_TABLET | Freq: Three times a day (TID) | ORAL | 1 refills | Status: DC | PRN

## 2022-09-20 NOTE — Telephone Encounter (Signed)
Done erx 

## 2022-09-20 NOTE — Telephone Encounter (Signed)
Prescription Request  09/20/2022  LOV: 03/19/2022  What is the name of the medication or equipment? cyclobenzaprine (FLEXERIL) 5 MG tablet   Have you contacted your pharmacy to request a refill? Yes   Which pharmacy would you like this sent to?  [place] ([address] [place] [id number] [place]) [place] [address] Phone: [phone] Fax: [phone]    Patient notified that their request is being sent to the clinical staff for review and that they should receive a response within 2 business days.   Please advise at [facility] [phone]   PT only requesting partial until her follow up appointment this Wednesday. She did state she should have enough to get her through till then though.

## 2022-09-22 ENCOUNTER — Telehealth: Payer: Self-pay

## 2022-09-22 ENCOUNTER — Ambulatory Visit (INDEPENDENT_AMBULATORY_CARE_PROVIDER_SITE_OTHER): Payer: Commercial Managed Care - HMO | Admitting: Internal Medicine

## 2022-09-22 ENCOUNTER — Encounter: Payer: Self-pay | Admitting: Internal Medicine

## 2022-09-22 VITALS — BP 128/76 | HR 68 | Temp 98.1°F | Ht 69.0 in | Wt 240.0 lb

## 2022-09-22 DIAGNOSIS — Z0001 Encounter for general adult medical examination with abnormal findings: Secondary | ICD-10-CM

## 2022-09-22 DIAGNOSIS — E1165 Type 2 diabetes mellitus with hyperglycemia: Secondary | ICD-10-CM | POA: Diagnosis not present

## 2022-09-22 DIAGNOSIS — E559 Vitamin D deficiency, unspecified: Secondary | ICD-10-CM | POA: Diagnosis not present

## 2022-09-22 DIAGNOSIS — I10 Essential (primary) hypertension: Secondary | ICD-10-CM | POA: Diagnosis not present

## 2022-09-22 DIAGNOSIS — Z860100 Personal history of colon polyps, unspecified: Secondary | ICD-10-CM | POA: Insufficient documentation

## 2022-09-22 DIAGNOSIS — E7849 Other hyperlipidemia: Secondary | ICD-10-CM | POA: Diagnosis not present

## 2022-09-22 DIAGNOSIS — E538 Deficiency of other specified B group vitamins: Secondary | ICD-10-CM | POA: Diagnosis not present

## 2022-09-22 LAB — CBC WITH DIFFERENTIAL/PLATELET
Basophils Absolute: 0.1 10*3/uL (ref 0.0–0.1)
Basophils Relative: 0.6 % (ref 0.0–3.0)
Eosinophils Absolute: 0.7 10*3/uL (ref 0.0–0.7)
Eosinophils Relative: 5.6 % — ABNORMAL HIGH (ref 0.0–5.0)
HCT: 43.1 % (ref 36.0–46.0)
Hemoglobin: 14.3 g/dL (ref 12.0–15.0)
Lymphocytes Relative: 38.6 % (ref 12.0–46.0)
Lymphs Abs: 4.8 10*3/uL — ABNORMAL HIGH (ref 0.7–4.0)
MCHC: 33.2 g/dL (ref 30.0–36.0)
MCV: 89.5 fl (ref 78.0–100.0)
Monocytes Absolute: 0.8 10*3/uL (ref 0.1–1.0)
Monocytes Relative: 6.4 % (ref 3.0–12.0)
Neutro Abs: 6.1 10*3/uL (ref 1.4–7.7)
Neutrophils Relative %: 48.8 % (ref 43.0–77.0)
Platelets: 282 10*3/uL (ref 150.0–400.0)
RBC: 4.82 Mil/uL (ref 3.87–5.11)
RDW: 13.3 % (ref 11.5–15.5)
WBC: 12.5 10*3/uL — ABNORMAL HIGH (ref 4.0–10.5)

## 2022-09-22 LAB — HEPATIC FUNCTION PANEL
ALT: 29 U/L (ref 0–35)
AST: 21 U/L (ref 0–37)
Albumin: 4.8 g/dL (ref 3.5–5.2)
Alkaline Phosphatase: 70 U/L (ref 39–117)
Bilirubin, Direct: 0.1 mg/dL (ref 0.0–0.3)
Total Bilirubin: 0.5 mg/dL (ref 0.2–1.2)
Total Protein: 7 g/dL (ref 6.0–8.3)

## 2022-09-22 LAB — BASIC METABOLIC PANEL
BUN: 16 mg/dL (ref 6–23)
CO2: 30 mEq/L (ref 19–32)
Calcium: 10.1 mg/dL (ref 8.4–10.5)
Chloride: 101 mEq/L (ref 96–112)
Creatinine, Ser: 0.91 mg/dL (ref 0.40–1.20)
GFR: 68.41 mL/min (ref >60.00)
Glucose, Bld: 128 mg/dL — ABNORMAL HIGH (ref 70–99)
Potassium: 3.8 mEq/L (ref 3.5–5.1)
Sodium: 136 mEq/L (ref 135–145)

## 2022-09-22 LAB — MICROALBUMIN / CREATININE URINE RATIO
Creatinine,U: 119.8 mg/dL
Microalb Creat Ratio: 0.6 mg/g (ref 0.0–30.0)
Microalb, Ur: <0.7 mg/dL (ref 0.0–1.9)

## 2022-09-22 LAB — VITAMIN D 25 HYDROXY (VIT D DEFICIENCY, FRACTURES): VITD: 49.25 ng/mL (ref 30.00–100.00)

## 2022-09-22 LAB — LIPID PANEL
Cholesterol: 138 mg/dL (ref 0–200)
HDL: 50.7 mg/dL (ref >39.00)
LDL Cholesterol: 49 mg/dL (ref 0–99)
NonHDL: 87.07
Total CHOL/HDL Ratio: 3
Triglycerides: 190 mg/dL — ABNORMAL HIGH (ref 0.0–149.0)
VLDL: 38 mg/dL (ref 0.0–40.0)

## 2022-09-22 LAB — TSH: TSH: 0.89 u[IU]/mL (ref 0.35–5.50)

## 2022-09-22 LAB — HEMOGLOBIN A1C: Hgb A1c MFr Bld: 6.8 % — ABNORMAL HIGH (ref 4.6–6.5)

## 2022-09-22 LAB — VITAMIN B12: Vitamin B-12: 584 pg/mL (ref 211–911)

## 2022-09-22 MED ORDER — CYCLOBENZAPRINE HCL 5 MG PO TABS: 5.0000 mg | ORAL_TABLET | Freq: Three times a day (TID) | ORAL | 1 refills | Status: DC | PRN

## 2022-09-22 MED ORDER — CYCLOBENZAPRINE HCL 5 MG PO TABS: 5.0000 mg | ORAL_TABLET | Freq: Three times a day (TID) | ORAL | 2 refills | Status: DC | PRN

## 2022-09-22 NOTE — Telephone Encounter (Signed)
Done erx 

## 2022-09-22 NOTE — Telephone Encounter (Signed)
Ok done to [facility] 

## 2022-09-22 NOTE — Telephone Encounter (Signed)
Patient seen in clinic today and is now requesting the remaining quantity of the cyclobenzaprine, I informed her that the order states that she is given 40 total. Patient states she was given 30 but would like a 90 days supply as usual.

## 2022-09-22 NOTE — Patient Instructions (Signed)
Please continue all other medications as before, and refills have been done if requested.  Please have the pharmacy call with any other refills you may need.  Please continue your efforts at being more active, low cholesterol diet, and weight control.  You are otherwise up to date with prevention measures today.  Please keep your appointments with your specialists as you may have planned  You will be contacted regarding the referral for: Eye doctor, and colonoscopy  Please go to the LAB at the blood drawing area for the tests to be done  You will be contacted by phone if any changes need to be made immediately.  Otherwise, you will receive a letter about your results with an explanation, but please check with MyChart first.  Please remember to sign up for MyChart if you have not done so, as this will be important to you in the future with finding out test results, communicating by private email, and scheduling acute appointments online when needed.  Please make an Appointment to return in 6 months, or sooner if needed

## 2022-09-22 NOTE — Progress Notes (Signed)
Patient ID: Jennifer Waters, female   DOB: [birth year], 62 y.o.   MRN: [id number]         Chief Complaint:: wellness exam and DM, hx of colon polyps, htn, hld, low vit d       HPI:  Jennifer Waters is a 62 y.o. female here for wellness exam; due for colonoscopy, eye exam, o/w up to date                        Also Pt denies chest pain, increased sob or doe, wheezing, orthopnea, PND, increased LE swelling, palpitations, dizziness or syncope.   Pt denies polydipsia, polyuria, or new focal neuro s/s.    Pt denies fever, wt loss, night sweats, loss of appetite, or other constitutional symptoms     Wt Readings from Last 3 Encounters:  09/22/22 240 lb (108.9 kg)  07/21/22 240 lb (108.9 kg)  03/19/22 237 lb (107.5 kg)   BP Readings from Last 3 Encounters:  09/22/22 128/76  07/21/22 126/80  03/19/22 116/64   Immunization History  Administered Date(s) Administered   Influenza,inj,Quad PF,6+ Mos 05/01/2015, 05/04/2016, 02/25/2017, 02/17/2018, 03/07/2019, 02/28/2020, 02/17/2021, 03/19/2022   PFIZER(Purple Top)SARS-COV-2 Vaccination 01/16/2020, 02/13/2020   PNEUMOCOCCAL CONJUGATE-20 09/18/2021   Tdap 06/03/2017   Zoster Recombinat (Shingrix) 11/21/2019, 02/28/2020   There are no preventive care reminders to display for this patient.     Past Medical History:  Diagnosis Date   Arthritis    Depression    Hypertension    Migraines    Obesity    Past Surgical History:  Procedure Laterality Date   ABDOMINAL HYSTERECTOMY     BREAST MASS EXCISION     benign done as a child   TRANSTHORACIC ECHOCARDIOGRAM  12/2018    EF 55-60%.  Impaired relaxation but indeterminate filling pressures.  Normal RV size and function.  Normal valves.  Normal aortic root.    reports that she quit smoking about 16 months ago. Her smoking use included cigarettes. She has a 10.00 pack-year smoking history. She has never used smokeless tobacco. She reports that she does not currently use alcohol after a past  usage of about 5.0 standard drinks of alcohol per week. She reports current drug use. Frequency: 1.00 time per week. Drug: Marijuana. family history includes Arrhythmia in her sister; Arthritis in her mother and sister; Brain cancer in her brother; Diabetes in her brother; Edema in her sister; Lung cancer in her father; Migraines in her mother; Ovarian cancer in her mother. Allergies  Allergen Reactions   Chantix [Varenicline] Nausea Only   Current Outpatient Medications on File Prior to Visit  Medication Sig Dispense Refill   amoxicillin-clavulanate (AUGMENTIN) 500-125 MG tablet Take 1 tablet by mouth 3 (three) times daily.     Acetaminophen 500 MG capsule 1 capsule as needed Orally every 6 hrs     albuterol (VENTOLIN HFA) 108 (90 Base) MCG/ACT inhaler Inhale 2 puffs into the lungs every 6 (six) hours as needed for wheezing or shortness of breath. 6.7 g 2   amLODipine (NORVASC) 10 MG tablet Take 1 tablet (10 mg total) by mouth daily. 90 tablet 3   atorvastatin (LIPITOR) 10 MG tablet Take 1 tablet (10 mg total) by mouth daily. 90 tablet 3   cetirizine (ZYRTEC) 10 MG tablet 1 tablet     Cholecalciferol 50 MCG (2000 UT) TABS 1 tab by mouth once daily 30 tablet 99   divalproex (DEPAKOTE) 500 MG DR tablet Take  by mouth. (Patient not taking: Reported on 07/21/2022)     EMGALITY 120 MG/ML SOAJ Inject into the skin. (Patient not taking: Reported on 07/21/2022)     fluticasone (FLONASE) 50 MCG/ACT nasal spray 1 spray in each nostril     gabapentin (NEURONTIN) 100 MG capsule Take 2 capsules (200 mg total) by mouth at bedtime. (Patient not taking: Reported on 07/21/2022) 180 capsule 1   losartan-hydrochlorothiazide (HYZAAR) 50-12.5 MG tablet Take 1 tablet by mouth daily. 90 tablet 3   meloxicam (MOBIC) 15 MG tablet Take 1 tablet (15 mg total) by mouth daily as needed for pain. 90 tablet 3   metFORMIN (GLUCOPHAGE-XR) 500 MG 24 hr tablet Take 500 mg by mouth every morning.     omeprazole (PRILOSEC) 20 MG  capsule TAKE 1 CAPSULE BY MOUTH EVERY DAY 90 capsule 0   rizatriptan (MAXALT) 10 MG tablet Take by mouth.     valACYclovir (VALTREX) 500 MG tablet 1 tablet     venlafaxine XR (EFFEXOR-XR) 150 MG 24 hr capsule Take 1 capsule (150 mg total) by mouth daily with breakfast. 90 capsule 0   No current facility-administered medications on file prior to visit.        ROS:  All others reviewed and negative.  Objective        PE:  BP 128/76   Pulse 68   Temp 98.1 F (36.7 C) (Oral)   Ht 5\' 9"  (1.753 m)   Wt 240 lb (108.9 kg)   BMI 35.44 kg/m                 Constitutional: Pt appears in NAD               HENT: Head: NCAT.                Right Ear: External ear normal.                 Left Ear: External ear normal.                Eyes: . Pupils are equal, round, and reactive to light. Conjunctivae and EOM are normal               Nose: without d/c or deformity               Neck: Neck supple. Gross normal ROM               Cardiovascular: Normal rate and regular rhythm.                 Pulmonary/Chest: Effort normal and breath sounds without rales or wheezing.                Abd:  Soft, NT, ND, + BS, no organomegaly               Neurological: Pt is alert. At baseline orientation, motor grossly intact               Skin: Skin is warm. No rashes, no other new lesions, LE edema - none               Psychiatric: Pt behavior is normal without agitation   Micro: none  Cardiac tracings I have personally interpreted today:  none  Pertinent Radiological findings (summarize): none   Lab Results  Component Value Date   WBC 12.5 (H) 09/22/2022   HGB 14.3 09/22/2022   HCT 43.1 09/22/2022   PLT 282.0 09/22/2022  GLUCOSE 128 (H) 09/22/2022   CHOL 138 09/22/2022   TRIG 190.0 (H) 09/22/2022   HDL 50.70 09/22/2022   LDLCALC 49 09/22/2022   ALT 29 09/22/2022   AST 21 09/22/2022   NA 136 09/22/2022   K 3.8 09/22/2022   CL 101 09/22/2022   CREATININE 0.91 09/22/2022   BUN 16 09/22/2022    CO2 30 09/22/2022   TSH 0.89 09/22/2022   HGBA1C 6.8 (H) 09/22/2022   MICROALBUR <0.7 09/22/2022   Assessment/Plan:  Jennifer Waters is a 62 y.o. Black or African American [2] female with  has a past medical history of Arthritis, Depression, Hypertension, Migraines, and Obesity.  Encounter for well adult exam with abnormal findings Age and sex appropriate education and counseling updated with regular exercise and diet Referrals for preventative services - due for colonoscopy, due for eye exam referral Immunizations addressed - none needed Smoking counseling  - none needed Evidence for depression or other mood disorder - none significant Most recent labs reviewed. I have personally reviewed and have noted: 1) the patient's medical and social history 2) The patient's current medications and supplements 3) The patient's height, weight, and BMI have been recorded in the chart   Diabetes [facility]) Lab Results  Component Value Date   HGBA1C 6.8 (H) 09/22/2022   Stable, pt to continue current medical treatment metformin 500 mg qd   Essential hypertension BP Readings from Last 3 Encounters:  09/22/22 128/76  07/21/22 126/80  03/19/22 116/64   Stable, pt to continue medical treatment norvasc 10 mg qd, hyzaar 50-12.5 qd   History of colonic polyps For colonoscopy  Hyperlipidemia due to dietary fat intake Lab Results  Component Value Date   LDLCALC 49 09/22/2022   Stable, pt to continue current statin lipitor 10 mg qd   Vitamin D deficiency Last vitamin D Lab Results  Component Value Date   VD25OH 49.25 09/22/2022   Stable, cont oral replacement  Followup: Return in about 6 months (around 03/24/2023).  Oliver Barre, MD 09/24/2022 8:57 PM [facility]

## 2022-09-22 NOTE — Addendum Note (Signed)
Addended by: Biagio Borg on: 09/22/2022 04:51 PM   Modules accepted: Orders

## 2022-09-23 LAB — URINALYSIS, ROUTINE W REFLEX MICROSCOPIC
Bilirubin Urine: NEGATIVE
Hgb urine dipstick: NEGATIVE
Ketones, ur: NEGATIVE
Leukocytes,Ua: NEGATIVE
Nitrite: NEGATIVE
Specific Gravity, Urine: 1.02 (ref 1.000–1.030)
Total Protein, Urine: NEGATIVE
Urine Glucose: NEGATIVE
Urobilinogen, UA: 0.2 (ref 0.0–1.0)
pH: 5.5 (ref 5.0–8.0)

## 2022-09-23 NOTE — Progress Notes (Signed)
Letter sent, cont same tx  The test results show that your current treatment is OK, as the tests are stable.  Please continue the same plan.  There is no other need for change of treatment or further evaluation based on these results, at this time.  thanks

## 2022-09-24 ENCOUNTER — Encounter: Payer: Self-pay | Admitting: Internal Medicine

## 2022-09-24 NOTE — Assessment & Plan Note (Signed)
For colonoscopy 

## 2022-09-24 NOTE — Assessment & Plan Note (Signed)
Age and sex appropriate education and counseling updated with regular exercise and diet Referrals for preventative services - due for colonoscopy, due for eye exam referral Immunizations addressed - none needed Smoking counseling  - none needed Evidence for depression or other mood disorder - none significant Most recent labs reviewed. I have personally reviewed and have noted: 1) the patient's medical and social history 2) The patient's current medications and supplements 3) The patient's height, weight, and BMI have been recorded in the chart

## 2022-09-24 NOTE — Assessment & Plan Note (Signed)
BP Readings from Last 3 Encounters:  09/22/22 128/76  07/21/22 126/80  03/19/22 116/64   Stable, pt to continue medical treatment norvasc 10 mg qd, hyzaar 50-12.5 qd

## 2022-09-24 NOTE — Assessment & Plan Note (Signed)
Lab Results  Component Value Date   LDLCALC 49 09/22/2022   Stable, pt to continue current statin lipitor 10 mg qd

## 2022-09-24 NOTE — Assessment & Plan Note (Signed)
Lab Results  Component Value Date   HGBA1C 6.8 (H) 09/22/2022   Stable, pt to continue current medical treatment metformin 500 mg qd

## 2022-09-24 NOTE — Assessment & Plan Note (Signed)
Last vitamin D Lab Results  Component Value Date   VD25OH 49.25 09/22/2022   Stable, cont oral replacement

## 2022-10-08 ENCOUNTER — Other Ambulatory Visit: Payer: Self-pay | Admitting: Internal Medicine

## 2022-10-14 ENCOUNTER — Other Ambulatory Visit: Payer: Self-pay

## 2022-10-19 DIAGNOSIS — Z1231 Encounter for screening mammogram for malignant neoplasm of breast: Secondary | ICD-10-CM | POA: Diagnosis not present

## 2022-10-19 LAB — HM MAMMOGRAPHY

## 2022-10-21 ENCOUNTER — Telehealth: Payer: Self-pay | Admitting: Internal Medicine

## 2022-10-21 NOTE — Telephone Encounter (Signed)
Patient states that her cyclobenzaprine needs refilled - she needs 90 called in and the directions changed to 3 a day - it is not lasting where she is just getting 40 filled at a time.  Please send to [facility], [place]  Pateints number:  [phone]  Last visit:  09/22/2022

## 2022-10-21 NOTE — Telephone Encounter (Signed)
Notified pt rx was sent to [facility] by MD on 09/22/22. Inform pt to call pharmacy to have filled.Marland KitchenRaechel Chute

## 2022-10-25 DIAGNOSIS — E119 Type 2 diabetes mellitus without complications: Secondary | ICD-10-CM | POA: Diagnosis not present

## 2022-10-25 LAB — HM DIABETES EYE EXAM

## 2022-11-09 ENCOUNTER — Encounter: Payer: Self-pay | Admitting: Internal Medicine

## 2022-12-30 ENCOUNTER — Ambulatory Visit: Payer: Medicare Other | Admitting: Internal Medicine

## 2023-01-04 ENCOUNTER — Other Ambulatory Visit: Payer: Self-pay | Admitting: Internal Medicine

## 2023-01-07 ENCOUNTER — Ambulatory Visit: Payer: Medicare Other | Admitting: Internal Medicine

## 2023-01-25 ENCOUNTER — Encounter: Payer: Self-pay | Admitting: Gastroenterology

## 2023-02-10 ENCOUNTER — Ambulatory Visit (AMBULATORY_SURGERY_CENTER): Payer: Medicare Other

## 2023-02-10 VITALS — Ht 69.0 in | Wt 240.0 lb

## 2023-02-10 DIAGNOSIS — Z86010 Personal history of colonic polyps: Secondary | ICD-10-CM

## 2023-02-10 MED ORDER — NA SULFATE-K SULFATE-MG SULF 17.5-3.13-1.6 GM/177ML PO SOLN
1.0000 | Freq: Once | ORAL | 0 refills | Status: AC
Start: 2023-02-10 — End: 2023-02-10

## 2023-02-10 NOTE — Progress Notes (Signed)
No egg or soy allergy known to patient  No issues known to pt with past sedation with any surgeries or procedures Patient denies ever being told they had issues or difficulty with intubation  No FH of Malignant Hyperthermia Pt is not on diet pills Pt is not on  home 02  Pt is not on blood thinners  Pt denies issues with constipation  No A fib or A flutter Have any cardiac testing pending--no  LOA: independent  Prep: Suprep   Patient's chart reviewed by Cathlyn Parsons CNRA prior to previsit and patient appropriate for the [facility].  Previsit completed and red dot placed by patient's name on their procedure day (on provider's schedule).     PV competed with patient. Prep instructions sent to patients home address. Goodrx coupon for [employer] provided to use for price reduction if needed.   Patient has been advised since she does not have mychart If she does not receive her prep instructions in the mail next week she would not to come to the office to get a hard copy

## 2023-02-24 ENCOUNTER — Ambulatory Visit (AMBULATORY_SURGERY_CENTER): Payer: Medicare Other | Admitting: Gastroenterology

## 2023-02-24 ENCOUNTER — Encounter: Payer: Self-pay | Admitting: Gastroenterology

## 2023-02-24 VITALS — BP 134/83 | HR 77 | Temp 98.4°F | Resp 21 | Ht 69.0 in | Wt 240.0 lb

## 2023-02-24 DIAGNOSIS — D122 Benign neoplasm of ascending colon: Secondary | ICD-10-CM

## 2023-02-24 DIAGNOSIS — K635 Polyp of colon: Secondary | ICD-10-CM | POA: Diagnosis not present

## 2023-02-24 DIAGNOSIS — Z86010 Personal history of colonic polyps: Secondary | ICD-10-CM | POA: Diagnosis not present

## 2023-02-24 DIAGNOSIS — Z09 Encounter for follow-up examination after completed treatment for conditions other than malignant neoplasm: Secondary | ICD-10-CM | POA: Diagnosis not present

## 2023-02-24 DIAGNOSIS — D123 Benign neoplasm of transverse colon: Secondary | ICD-10-CM

## 2023-02-24 MED ORDER — SODIUM CHLORIDE 0.9 % IV SOLN
500.0000 mL | Freq: Once | INTRAVENOUS | Status: DC
Start: 2023-02-24 — End: 2023-06-23

## 2023-02-24 NOTE — Progress Notes (Signed)
Sedate, gd SR, tolerated procedure well, VSS, report to RN 

## 2023-02-24 NOTE — Progress Notes (Signed)
Pt's states no medical or surgical changes since previsit or office visit. 

## 2023-02-24 NOTE — Op Note (Signed)
[facility] Patient Name: Jennifer Waters Procedure Date: 02/24/2023 2:12 PM MRN: [id number] Endoscopist: Lorin Picket E. Tomasa Rand , MD, [id number] Age: 62 Referring MD:  Date of Birth: December 30, 1960 Gender: Female Account #: [id number] Procedure:                Colonoscopy Indications:              Surveillance: Personal history of adenomatous                            polyps on last colonoscopy > 3 years ago, High risk                            colon cancer surveillance: Personal history of                            multiple (3 or more) adenomas Medicines:                Monitored Anesthesia Care Procedure:                Pre-Anesthesia Assessment:                           - Prior to the procedure, a History and Physical                            was performed, and patient medications and                            allergies were reviewed. The patient's tolerance of                            previous anesthesia was also reviewed. The risks                            and benefits of the procedure and the sedation                            options and risks were discussed with the patient.                            All questions were answered, and informed consent                            was obtained. Prior Anticoagulants: The patient has                            taken no anticoagulant or antiplatelet agents. ASA                            Grade Assessment: II - A patient with mild systemic                            disease. After reviewing the risks and benefits,  the patient was deemed in satisfactory condition to                            undergo the procedure.                           After obtaining informed consent, the colonoscope                            was passed under direct vision. Throughout the                            procedure, the patient's blood pressure, pulse, and                            oxygen saturations were  monitored continuously. The                            Olympus Scope [id number] was introduced through the                            anus and advanced to the the cecum, identified by                            appendiceal orifice and ileocecal valve. The                            colonoscopy was technically difficult and complex                            due to poor endoscopic visualization and                            significant looping. Successful completion of the                            procedure was aided by changing the patient to a                            supine position and using manual pressure. The                            patient tolerated the procedure well. The quality                            of the bowel preparation was adequate. The                            ileocecal valve, appendiceal orifice, and rectum                            were photographed. The bowel preparation used was  SUPREP via split dose instruction. Scope In: 2:26:33 PM Scope Out: 2:53:04 PM Scope Withdrawal Time: 0 hours 21 minutes 30 seconds  Total Procedure Duration: 0 hours 26 minutes 31 seconds  Findings:                 The perianal and digital rectal examinations were                            normal. Pertinent negatives include normal                            sphincter tone and no palpable rectal lesions.                           A 5 mm polyp was found in the transverse colon. The                            polyp was sessile. The polyp was removed with a                            cold snare. Resection and retrieval were complete.                            Estimated blood loss was minimal.                           Three sessile polyps were found in the ascending                            colon. The polyps were 1 to 2 mm in size. These                            polyps were removed with a cold snare. Resection                            and  retrieval were complete. Estimated blood loss                            was minimal.                           A 2 mm polyp was found in the ascending colon on                            the proximal side of a fold. The polyp was sessile.                            No tissue was resected. Polyp resection was                            unsuccessful due to poor endoscopic visualization  and difficult location on back of fold with                            excessive looping. Unable to get polyp into                            position for polypectomy despite supine positioning                            and multiple attempts and withdrawal and                            reinsertion with manual abdominal pressure.                            Polypectomy made more difficult by prominent                            diaphragmatic breathing causing excessive movement                            and impeding position stability.                           The exam was otherwise normal throughout the                            examined colon.                           The retroflexed view of the distal rectum and anal                            verge was normal and showed no anal or rectal                            abnormalities. Complications:            No immediate complications. Estimated Blood Loss:     Estimated blood loss was minimal. Impression:               - One 5 mm polyp in the transverse colon, removed                            with a cold snare. Resected and retrieved.                           - Three 1 to 2 mm polyps in the ascending colon,                            removed with a cold snare. Resected and retrieved.                           - One 2 mm polyp in the ascending colon.  Unsuccessful polyp resection. Given very small                            size, this lesion is very low likelihood for                             progression.                           - The distal rectum and anal verge are normal on                            retroflexion view. Recommendation:           - Patient has a contact number available for                            emergencies. The signs and symptoms of potential                            delayed complications were discussed with the                            patient. Return to normal activities tomorrow.                            Written discharge instructions were provided to the                            patient.                           - Resume previous diet.                           - Continue present medications.                           - Await pathology results.                           - Repeat colonoscopy in 3 years for surveillance                            based on pathology results. Merlinda Wrubel E. Tomasa Rand, MD 02/24/2023 3:02:52 PM This report has been signed electronically.

## 2023-02-24 NOTE — Patient Instructions (Signed)
Impression/Recommendations:  Polyp handout given to patient.  Resume previous diet. Continue present medications. Await pathology results.  Repeat colonoscopy in 3 years for surveillance based on pathology results.  YOU HAD AN ENDOSCOPIC PROCEDURE TODAY AT THE [facility]:   Refer to the procedure report that was given to you for any specific questions about what was found during the examination.  If the procedure report does not answer your questions, please call your gastroenterologist to clarify.  If you requested that your care partner not be given the details of your procedure findings, then the procedure report has been included in a sealed envelope for you to review at your convenience later.  YOU SHOULD EXPECT: Some feelings of bloating in the abdomen. Passage of more gas than usual.  Walking can help get rid of the air that was put into your GI tract during the procedure and reduce the bloating. If you had a lower endoscopy (such as a colonoscopy or flexible sigmoidoscopy) you may notice spotting of blood in your stool or on the toilet paper. If you underwent a bowel prep for your procedure, you may not have a normal bowel movement for a few days.  Please Note:  You might notice some irritation and congestion in your nose or some drainage.  This is from the oxygen used during your procedure.  There is no need for concern and it should clear up in a day or so.  SYMPTOMS TO REPORT IMMEDIATELY:  Following lower endoscopy (colonoscopy or flexible sigmoidoscopy):  Excessive amounts of blood in the stool  Significant tenderness or worsening of abdominal pains  Swelling of the abdomen that is new, acute  Fever of 100F or higher  For urgent or emergent issues, a gastroenterologist can be reached at any hour by calling [phone]. Do not use MyChart messaging for urgent concerns.    DIET:  We do recommend a small meal at first, but then you may proceed to your regular  diet.  Drink plenty of fluids but you should avoid alcoholic beverages for 24 hours.  ACTIVITY:  You should plan to take it easy for the rest of today and you should NOT DRIVE or use heavy machinery until tomorrow (because of the sedation medicines used during the test).    FOLLOW UP: Our staff will call the number listed on your records the next business day following your procedure.  We will call around 7:15- 8:00 am to check on you and address any questions or concerns that you may have regarding the information given to you following your procedure. If we do not reach you, we will leave a message.     If any biopsies were taken you will be contacted by phone or by letter within the next 1-3 weeks.  Please call us at [phone] if you have not heard about the biopsies in 3 weeks.    SIGNATURES/CONFIDENTIALITY: You and/or your care partner have signed paperwork which will be entered into your electronic medical record.  These signatures attest to the fact that that the information above on your After Visit Summary has been reviewed and is understood.  Full responsibility of the confidentiality of this discharge information lies with you and/or your care-partner.

## 2023-02-24 NOTE — Progress Notes (Signed)
Called to room to assist during endoscopic procedure.  Patient ID and intended procedure confirmed with present staff. Received instructions for my participation in the procedure from the performing physician.  

## 2023-02-24 NOTE — Progress Notes (Signed)
Gastroenterology History and Physical   Primary Care Physician:  Corwin Levins, MD   Reason for Procedure:   History of colon polyps  Plan:    Surveillance colonoscopy     HPI: Jennifer Waters is a 62 y.o. female undergoing surveillance colonoscopy.  She has no family history of colon cancer and no chronic GI symptoms. She had 9 tubular adenomas removed on her index screening colonoscopy in 2020.   Past Medical History:  Diagnosis Date   Arthritis    Depression    Hypertension    Migraines    Obesity     Past Surgical History:  Procedure Laterality Date   ABDOMINAL HYSTERECTOMY     BREAST MASS EXCISION     benign done as a child   COLONOSCOPY  01/2019   TRANSTHORACIC ECHOCARDIOGRAM  12/2018    EF 55-60%.  Impaired relaxation but indeterminate filling pressures.  Normal RV size and function.  Normal valves.  Normal aortic root.    Prior to Admission medications   Medication Sig Start Date End Date Taking? Authorizing Provider  Acetaminophen 500 MG capsule 1 capsule as needed Orally every 6 hrs   Yes [provider]  amLODipine (NORVASC) 10 MG tablet Take 1 tablet (10 mg total) by mouth daily. 07/21/22  Yes Monge, Petra Kuba, NP  atorvastatin (LIPITOR) 10 MG tablet Take 1 tablet (10 mg total) by mouth daily. 07/21/22  Yes Monge, Petra Kuba, NP  cetirizine (ZYRTEC) 10 MG tablet 1 tablet   Yes [provider]  Cholecalciferol (VITAMIN D3 GUMMIES PO) Take by mouth.   Yes [provider]  cyclobenzaprine (FLEXERIL) 5 MG tablet Take 1 tablet (5 mg total) by mouth 3 (three) times daily as needed for muscle spasms. 09/22/22  Yes Corwin Levins, MD  fluticasone [facility]) 50 MCG/ACT nasal spray 1 spray in each nostril   Yes [provider]  losartan-hydrochlorothiazide (HYZAAR) 50-12.5 MG tablet Take 1 tablet by mouth daily. 07/21/22  Yes Monge, Petra Kuba, NP  meloxicam (MOBIC) 15 MG tablet Take 1 tablet (15 mg total) by mouth daily as needed for  pain. 03/19/22  Yes Corwin Levins, MD  metFORMIN (GLUCOPHAGE-XR) 500 MG 24 hr tablet Take 1 tablet by mouth once daily with breakfast 01/04/23  Yes Corwin Levins, MD  omeprazole (PRILOSEC) 20 MG capsule TAKE 1 CAPSULE BY MOUTH EVERY DAY 06/23/17  Yes Dragnev, Alphonse Guild, NP  venlafaxine XR (EFFEXOR-XR) 150 MG 24 hr capsule TAKE 1 CAPSULE BY MOUTH ONCE DAILY WITH BREAKFAST 01/04/23  Yes Corwin Levins, MD  albuterol (VENTOLIN HFA) 108 (90 Base) MCG/ACT inhaler Inhale 2 puffs into the lungs every 6 (six) hours as needed for wheezing or shortness of breath. 11/21/19   Olive Bass, FNP  Cholecalciferol 50 MCG (2000 UT) TABS 1 tab by mouth once daily Patient not taking: Reported on 02/24/2023 02/17/21   Corwin Levins, MD  divalproex (DEPAKOTE) 500 MG DR tablet Take by mouth. Patient not taking: Reported on 07/21/2022 08/07/20   [provider]  EMGALITY 120 MG/ML SOAJ Inject into the skin. Patient not taking: Reported on 07/21/2022 08/14/20   [provider]  gabapentin (NEURONTIN) 100 MG capsule Take 2 capsules (200 mg total) by mouth at bedtime. Patient not taking: Reported on 07/21/2022 02/28/20   Corwin Levins, MD  rizatriptan (MAXALT) 10 MG tablet Take by mouth. Patient not taking: Reported on 02/24/2023 11/04/20   [provider]  valACYclovir (VALTREX) 500  MG tablet 1 tablet Patient not taking: Reported on 02/24/2023    [provider]    Current Outpatient Medications  Medication Sig Dispense Refill   Acetaminophen 500 MG capsule 1 capsule as needed Orally every 6 hrs     amLODipine (NORVASC) 10 MG tablet Take 1 tablet (10 mg total) by mouth daily. 90 tablet 3   atorvastatin (LIPITOR) 10 MG tablet Take 1 tablet (10 mg total) by mouth daily. 90 tablet 3   cetirizine (ZYRTEC) 10 MG tablet 1 tablet     Cholecalciferol (VITAMIN D3 GUMMIES PO) Take by mouth.     cyclobenzaprine (FLEXERIL) 5 MG tablet Take 1 tablet (5 mg total) by mouth 3 (three) times daily as  needed for muscle spasms. 270 tablet 1   fluticasone (FLONASE) 50 MCG/ACT nasal spray 1 spray in each nostril     losartan-hydrochlorothiazide (HYZAAR) 50-12.5 MG tablet Take 1 tablet by mouth daily. 90 tablet 3   meloxicam (MOBIC) 15 MG tablet Take 1 tablet (15 mg total) by mouth daily as needed for pain. 90 tablet 3   metFORMIN (GLUCOPHAGE-XR) 500 MG 24 hr tablet Take 1 tablet by mouth once daily with breakfast 90 tablet 2   omeprazole (PRILOSEC) 20 MG capsule TAKE 1 CAPSULE BY MOUTH EVERY DAY 90 capsule 0   venlafaxine XR (EFFEXOR-XR) 150 MG 24 hr capsule TAKE 1 CAPSULE BY MOUTH ONCE DAILY WITH BREAKFAST 90 capsule 2   albuterol (VENTOLIN HFA) 108 (90 Base) MCG/ACT inhaler Inhale 2 puffs into the lungs every 6 (six) hours as needed for wheezing or shortness of breath. 6.7 g 2   Cholecalciferol 50 MCG (2000 UT) TABS 1 tab by mouth once daily (Patient not taking: Reported on 02/24/2023) 30 tablet 99   divalproex (DEPAKOTE) 500 MG DR tablet Take by mouth. (Patient not taking: Reported on 07/21/2022)     EMGALITY 120 MG/ML SOAJ Inject into the skin. (Patient not taking: Reported on 07/21/2022)     gabapentin (NEURONTIN) 100 MG capsule Take 2 capsules (200 mg total) by mouth at bedtime. (Patient not taking: Reported on 07/21/2022) 180 capsule 1   rizatriptan (MAXALT) 10 MG tablet Take by mouth. (Patient not taking: Reported on 02/24/2023)     valACYclovir (VALTREX) 500 MG tablet 1 tablet (Patient not taking: Reported on 02/24/2023)     Current Facility-Administered Medications  Medication Dose Route Frequency Provider Last Rate Last Admin   0.9 %  sodium chloride infusion  500 mL Intravenous Once Jenel Lucks, MD        Allergies as of 02/24/2023 - Review Complete 02/24/2023  Allergen Reaction Noted   Chantix [varenicline] Nausea Only 12/16/2017    Family History  Problem Relation Age of Onset   Arthritis Mother    Ovarian cancer Mother    Migraines Mother    Lung cancer Father     Arrhythmia Sister    Brain cancer Brother    Edema Sister    Arthritis Sister    Diabetes Brother    Colon polyps Neg Hx    Colon cancer Neg Hx    Esophageal cancer Neg Hx    Rectal cancer Neg Hx    Stomach cancer Neg Hx     Social History   Socioeconomic History   Marital status: Divorced    Spouse name: Not on file   Number of children: 1   Years of education: 14   Highest education level: Not on file  Occupational History   Occupation: Merchandiser, retail  Tobacco Use   Smoking status: Former    Current packs/day: 0.00    Average packs/day: 0.5 packs/day for 20.0 years (10.0 ttl pk-yrs)    Types: Cigarettes    Start date: 05/21/2001    Quit date: 05/21/2021    Years since quitting: 1.7   Smokeless tobacco: Never  Vaping Use   Vaping status: Never Used  Substance and Sexual Activity   Alcohol use: Not Currently    Alcohol/week: 5.0 standard drinks of alcohol    Types: 5 Glasses of wine per week   Drug use: Yes    Frequency: 1.0 times per week    Types: Marijuana   Sexual activity: Yes    Birth control/protection: Surgical  Other Topics Concern   Not on file  Social History Narrative   Fun: Puzzles, cats   Denies any religious beliefs effecting health care. Lives with daughter in a one story home.  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  Education: some college.   Social Determinants of Health   Financial Resource Strain: High Risk (07/25/2020)   Overall Financial Resource Strain (CARDIA)    Difficulty of Paying Living Expenses: Hard  Food Insecurity: No Food Insecurity (08/07/2020)   Received from [facility]   Hunger Vital Sign    Worried About Running Out of Food in the Last Year: Never true    Ran Out of Food in the Last Year: Never true  Transportation Needs: No Transportation Needs (07/25/2020)   PRAPARE - Administrator, Civil Service (Medical): No    Lack of Transportation (Non-Medical): No  Physical Activity: Not on file  Stress: Not on file  Social  Connections: Unknown (10/30/2021)   [facility] from [facility]   Social Network    Social Network: Not on file  Intimate Partner Violence: Unknown (09/21/2021)   Received from [facility]   HITS    Physically Hurt: Not on file    Insult or Talk Down To: Not on file    Threaten Physical Harm: Not on file    Scream or Curse: Not on file    Review of Systems:  All other review of systems negative except as mentioned in the HPI.  Physical Exam: Vital signs BP 130/78   Pulse 90   Temp 98.4 F (36.9 C) (Temporal)   Ht 5\' 9"  (1.753 m)   Wt 240 lb (108.9 kg)   SpO2 93%   BMI 35.44 kg/m   General:   Alert,  Well-developed, well-nourished, pleasant and cooperative in NAD Airway:  Mallampati 2 Lungs:  Clear throughout to auscultation.   Heart:  Regular rate and rhythm; no murmurs, clicks, rubs,  or gallops. Abdomen:  Soft, nontender and nondistended. Normal bowel sounds.   Neuro/Psych:  Normal mood and affect. A and O x 3   Zyionna Pesce E. Tomasa Rand, MD [facility]

## 2023-02-25 ENCOUNTER — Telehealth: Payer: Self-pay

## 2023-02-25 NOTE — Telephone Encounter (Signed)
  Follow up Call-     02/24/2023    1:17 PM  Call back number  Post procedure Call Back phone  # [phone]  Permission to leave phone message Yes     Patient questions:  Do you have a fever, pain , or abdominal swelling? No. Pain Score  0 *  Have you tolerated food without any problems? Yes.    Have you been able to return to your normal activities? Yes.    Do you have any questions about your discharge instructions: Diet   No. Medications  No. Follow up visit  No.  Do you have questions or concerns about your Care? No.  Actions: * If pain score is 4 or above: No action needed, pain <4.

## 2023-03-03 LAB — SURGICAL PATHOLOGY

## 2023-03-06 ENCOUNTER — Encounter: Payer: Self-pay | Admitting: Gastroenterology

## 2023-03-14 ENCOUNTER — Ambulatory Visit
Admission: EM | Admit: 2023-03-14 | Discharge: 2023-03-14 | Disposition: A | Discharge status: Home / Self Care | Payer: Medicare Other | Attending: Physician Assistant | Admitting: Physician Assistant

## 2023-03-14 DIAGNOSIS — M25512 Pain in left shoulder: Secondary | ICD-10-CM

## 2023-03-14 MED ORDER — KETOROLAC TROMETHAMINE 30 MG/ML IJ SOLN
30.0000 mg | Freq: Once | INTRAMUSCULAR | Status: AC
  Administered 2023-03-14: 30 mg via INTRAMUSCULAR

## 2023-03-14 MED ORDER — PREDNISONE 20 MG PO TABS
40.0000 mg | ORAL_TABLET | Freq: Every day | ORAL | 0 refills | Status: AC
Start: 2023-03-14 — End: 2023-03-19

## 2023-03-14 NOTE — Discharge Instructions (Addendum)
Take prednisone as prescribed Continue with Flexeril and Mobic Follow up with Primary Care Physician  Can apply ice as needed, recommend gentle stretching.

## 2023-03-14 NOTE — ED Triage Notes (Signed)
Pt presents to UC w/ c/o right shoulder pain which has worsened over the past week. Pt states it is worse when lifting and worse at night. Pt states she usually sleeps on the right side. Also reports left knuckle pain. Hx arthritis. Meloxicam and acetaminophen do not help

## 2023-03-14 NOTE — ED Provider Notes (Signed)
[facility]    CSN: [id number] Arrival date & time: 03/14/23  0830      History   Chief Complaint No chief complaint on file.   HPI Jennifer Waters is a 62 y.o. female.   Patient complains of right shoulder pain that started a few weeks ago.  She reports intermittent right shoulder pain, became worse when she reached for discharge that was dropping.  She reports pain is worse with movement.  She reports lying on the right side increases pain at night.  She currently takes meloxicam daily and Flexeril daily for systemic muscle spasms.  She reports this is providing minimal relief to right shoulder and arm pain.  Denies radiating symptoms, numbness, tingling, weakness.    Past Medical History:  Diagnosis Date   Arthritis    Depression    Hypertension    Migraines    Obesity     Patient Active Problem List   Diagnosis Date Noted   History of colonic polyps 09/22/2022   Onychomycosis 03/20/2022   Polyarthralgia 03/20/2022   Primary localized osteoarthrosis of multiple sites 03/19/2022   Muscle cramp 03/19/2022   Diabetes (HCC) 09/18/2021   Dysphagia 02/21/2021   Right hip pain 02/21/2021   Hyperlipidemia due to dietary fat intake 08/03/2020   Chronic headache 03/01/2020   Asthma 12/23/2019   Muscle cramping 03/07/2019   Scapular dyskinesis 02/21/2019   Patellofemoral arthritis of right knee 01/22/2019   Right shoulder pain 12/22/2018   Vitamin D deficiency 12/20/2018   Cervical radiculopathy at C6 12/20/2018   Syncope 12/20/2018   Nonallopathic lesion of cervical region 12/02/2017   Nonallopathic lesion of thoracic region 12/02/2017   Nonallopathic lesion of rib cage 12/02/2017   Encounter for well adult exam with abnormal findings 11/04/2017   Tobacco abuse 07/08/2017   Anxiety and depression 11/22/2016   Cervical disc disorder with radiculopathy of cervical region 05/04/2016   Cough 03/15/2016   Snoring 12/11/2015   GERD (gastroesophageal  reflux disease) 12/11/2015   Allergic rhinitis 05/01/2015   Osteoarthritis 10/17/2014   Essential hypertension 10/17/2014    Past Surgical History:  Procedure Laterality Date   ABDOMINAL HYSTERECTOMY     BREAST MASS EXCISION     benign done as a child   COLONOSCOPY  01/2019   TRANSTHORACIC ECHOCARDIOGRAM  12/2018    EF 55-60%.  Impaired relaxation but indeterminate filling pressures.  Normal RV size and function.  Normal valves.  Normal aortic root.    OB History   No obstetric history on file.      Home Medications    Prior to Admission medications   Medication Sig Start Date End Date Taking? Authorizing Provider  predniSONE (DELTASONE) 20 MG tablet Take 2 tablets (40 mg total) by mouth daily for 5 days. 03/14/23 03/19/23 Yes Ward, Tylene Fantasia, PA-C  Acetaminophen 500 MG capsule 1 capsule as needed Orally every 6 hrs    [provider]  albuterol (VENTOLIN HFA) 108 (90 Base) MCG/ACT inhaler Inhale 2 puffs into the lungs every 6 (six) hours as needed for wheezing or shortness of breath. 11/21/19   Olive Bass, FNP  amLODipine (NORVASC) 10 MG tablet Take 1 tablet (10 mg total) by mouth daily. 07/21/22   Joylene Grapes, NP  atorvastatin (LIPITOR) 10 MG tablet Take 1 tablet (10 mg total) by mouth daily. 07/21/22   Joylene Grapes, NP  cetirizine (ZYRTEC) 10 MG tablet 1 tablet    [provider]  Cholecalciferol (VITAMIN D3  GUMMIES PO) Take by mouth.    [provider]  Cholecalciferol 50 MCG (2000 UT) TABS 1 tab by mouth once daily Patient not taking: Reported on 02/24/2023 02/17/21   Corwin Levins, MD  cyclobenzaprine (FLEXERIL) 5 MG tablet Take 1 tablet (5 mg total) by mouth 3 (three) times daily as needed for muscle spasms. 09/22/22   Corwin Levins, MD  divalproex (DEPAKOTE) 500 MG DR tablet Take by mouth. Patient not taking: Reported on 07/21/2022 08/07/20   [provider]  EMGALITY 120 MG/ML SOAJ Inject into the skin. Patient not taking:  Reported on 07/21/2022 08/14/20   [provider]  fluticasone (FLONASE) 50 MCG/ACT nasal spray 1 spray in each nostril    [provider]  gabapentin (NEURONTIN) 100 MG capsule Take 2 capsules (200 mg total) by mouth at bedtime. Patient not taking: Reported on 07/21/2022 02/28/20   Corwin Levins, MD  losartan-hydrochlorothiazide [facility]) 50-12.5 MG tablet Take 1 tablet by mouth daily. 07/21/22   Joylene Grapes, NP  meloxicam (MOBIC) 15 MG tablet Take 1 tablet (15 mg total) by mouth daily as needed for pain. 03/19/22   Corwin Levins, MD  metFORMIN (GLUCOPHAGE-XR) 500 MG 24 hr tablet Take 1 tablet by mouth once daily with breakfast 01/04/23   Corwin Levins, MD  omeprazole (PRILOSEC) 20 MG capsule TAKE 1 CAPSULE BY MOUTH EVERY DAY 06/23/17   Dragnev, Alphonse Guild, NP  rizatriptan (MAXALT) 10 MG tablet Take by mouth. Patient not taking: Reported on 02/24/2023 11/04/20   [provider]  valACYclovir (VALTREX) 500 MG tablet 1 tablet Patient not taking: Reported on 02/24/2023    [provider]  venlafaxine XR (EFFEXOR-XR) 150 MG 24 hr capsule TAKE 1 CAPSULE BY MOUTH ONCE DAILY WITH BREAKFAST 01/04/23   Corwin Levins, MD    Family History Family History  Problem Relation Age of Onset   Arthritis Mother    Ovarian cancer Mother    Migraines Mother    Lung cancer Father    Arrhythmia Sister    Brain cancer Brother    Edema Sister    Arthritis Sister    Diabetes Brother    Colon polyps Neg Hx    Colon cancer Neg Hx    Esophageal cancer Neg Hx    Rectal cancer Neg Hx    Stomach cancer Neg Hx     Social History Social History   Tobacco Use   Smoking status: Former    Current packs/day: 0.00    Average packs/day: 0.5 packs/day for 20.0 years (10.0 ttl pk-yrs)    Types: Cigarettes    Start date: 05/21/2001    Quit date: 05/21/2021    Years since quitting: 1.8   Smokeless tobacco: Never  Vaping Use   Vaping status: Never Used  Substance Use Topics   Alcohol  use: Not Currently    Alcohol/week: 5.0 standard drinks of alcohol    Types: 5 Glasses of wine per week   Drug use: Yes    Frequency: 1.0 times per week    Types: Marijuana     Allergies   Chantix [varenicline]   Review of Systems Review of Systems  Constitutional:  Negative for chills and fever.  HENT:  Negative for ear pain and sore throat.   Eyes:  Negative for pain and visual disturbance.  Respiratory:  Negative for cough and shortness of breath.   Cardiovascular:  Negative for chest pain and palpitations.  Gastrointestinal:  Negative for  abdominal pain and vomiting.  Genitourinary:  Negative for dysuria and hematuria.  Musculoskeletal:  Positive for arthralgias. Negative for back pain.  Skin:  Negative for color change and rash.  Neurological:  Negative for seizures and syncope.  All other systems reviewed and are negative.    Physical Exam Triage Vital Signs ED Triage Vitals  Encounter Vitals Group     BP 03/14/23 0910 136/74     Systolic BP Percentile --      Diastolic BP Percentile --      Pulse Rate 03/14/23 0910 88     Resp 03/14/23 0910 16     Temp 03/14/23 0910 98.1 F (36.7 C)     Temp Source 03/14/23 0910 Oral     SpO2 03/14/23 0910 96 %     Weight --      Height --      Head Circumference --      Peak Flow --      Pain Score 03/14/23 0907 3     Pain Loc --      Pain Education --      Exclude from Growth Chart --    No data found.  Updated Vital Signs BP 136/74 (BP Location: Left Arm)   Pulse 88   Temp 98.1 F (36.7 C) (Oral)   Resp 16   SpO2 96%   Visual Acuity Right Eye Distance:   Left Eye Distance:   Bilateral Distance:    Right Eye Near:   Left Eye Near:    Bilateral Near:     Physical Exam Vitals and nursing note reviewed.  Constitutional:      General: She is not in acute distress.    Appearance: She is well-developed.  HENT:     Head: Normocephalic and atraumatic.  Eyes:     Conjunctiva/sclera: Conjunctivae normal.   Cardiovascular:     Rate and Rhythm: Normal rate and regular rhythm.     Heart sounds: No murmur heard. Pulmonary:     Effort: Pulmonary effort is normal. No respiratory distress.     Breath sounds: Normal breath sounds.  Abdominal:     Palpations: Abdomen is soft.     Tenderness: There is no abdominal tenderness.  Musculoskeletal:        General: No swelling.     Right shoulder: Tenderness present. No swelling, deformity or bony tenderness. Normal range of motion.     Left shoulder: Normal.     Cervical back: Neck supple.  Skin:    General: Skin is warm and dry.     Capillary Refill: Capillary refill takes less than 2 seconds.  Neurological:     Mental Status: She is alert.  Psychiatric:        Mood and Affect: Mood normal.      UC Treatments / Results  Labs (all labs ordered are listed, but only abnormal results are displayed) Labs Reviewed - No data to display  EKG   Radiology No results found.  Procedures Procedures (including critical care time)  Medications Ordered in UC Medications  ketorolac (TORADOL) 30 MG/ML injection 30 mg (has no administration in time range)    Initial Impression / Assessment and Plan / UC Course  I have reviewed the triage vital signs and the nursing notes.  Pertinent labs & imaging results that were available during my care of the patient were reviewed by me and considered in my medical decision making (see chart for details).     Right  shoulder muscle strain, normal strength and passive range of motion.  Patient currently taking Mobic and Flexeril.  Toradol given in clinic today.  Will do a short course of prednisone to help with inflammation.  Patient is diabetic last A1c in April of this year 6.8.  Supportive care discussed.  Advised to follow-up with PCP or Ortho if minimal improvement. Final Clinical Impressions(s) / UC Diagnoses   Final diagnoses:  Acute pain of left shoulder     Discharge Instructions      Take  prednisone as prescribed Continue with Flexeril and Mobic Follow up with Primary Care Physician  Can apply ice as needed, recommend gentle stretching.     ED Prescriptions     Medication Sig Dispense Auth. Provider   predniSONE (DELTASONE) 20 MG tablet Take 2 tablets (40 mg total) by mouth daily for 5 days. 10 tablet Ward, Tylene Fantasia, PA-C      PDMP not reviewed this encounter.   Ward, Tylene Fantasia, PA-C 03/14/23 [id number]

## 2023-04-26 ENCOUNTER — Ambulatory Visit: Payer: Medicare Other

## 2023-04-26 ENCOUNTER — Encounter: Payer: Self-pay | Admitting: Internal Medicine

## 2023-04-26 ENCOUNTER — Other Ambulatory Visit: Payer: Self-pay | Admitting: Internal Medicine

## 2023-04-26 ENCOUNTER — Ambulatory Visit: Payer: Medicare Other | Admitting: Internal Medicine

## 2023-04-26 VITALS — BP 126/78 | HR 100 | Temp 98.4°F | Ht 69.0 in | Wt 239.0 lb

## 2023-04-26 DIAGNOSIS — E7849 Other hyperlipidemia: Secondary | ICD-10-CM | POA: Diagnosis not present

## 2023-04-26 DIAGNOSIS — M19011 Primary osteoarthritis, right shoulder: Secondary | ICD-10-CM | POA: Diagnosis not present

## 2023-04-26 DIAGNOSIS — Z23 Encounter for immunization: Secondary | ICD-10-CM | POA: Diagnosis not present

## 2023-04-26 DIAGNOSIS — I10 Essential (primary) hypertension: Secondary | ICD-10-CM

## 2023-04-26 DIAGNOSIS — E1165 Type 2 diabetes mellitus with hyperglycemia: Secondary | ICD-10-CM

## 2023-04-26 DIAGNOSIS — E559 Vitamin D deficiency, unspecified: Secondary | ICD-10-CM | POA: Diagnosis not present

## 2023-04-26 DIAGNOSIS — Z7984 Long term (current) use of oral hypoglycemic drugs: Secondary | ICD-10-CM | POA: Diagnosis not present

## 2023-04-26 DIAGNOSIS — M25511 Pain in right shoulder: Secondary | ICD-10-CM | POA: Diagnosis not present

## 2023-04-26 LAB — LIPID PANEL
Cholesterol: 112 mg/dL (ref 0–200)
HDL: 42.3 mg/dL (ref >39.00)
LDL Cholesterol: 49 mg/dL (ref 0–99)
NonHDL: 70.08
Total CHOL/HDL Ratio: 3
Triglycerides: 106 mg/dL (ref 0.0–149.0)
VLDL: 21.2 mg/dL (ref 0.0–40.0)

## 2023-04-26 LAB — BASIC METABOLIC PANEL
BUN: 18 mg/dL (ref 6–23)
CO2: 30 meq/L (ref 19–32)
Calcium: 10.1 mg/dL (ref 8.4–10.5)
Chloride: 103 meq/L (ref 96–112)
Creatinine, Ser: 1 mg/dL (ref 0.40–1.20)
GFR: 60.84 mL/min (ref >60.00)
Glucose, Bld: 147 mg/dL — ABNORMAL HIGH (ref 70–99)
Potassium: 3.9 meq/L (ref 3.5–5.1)
Sodium: 139 meq/L (ref 135–145)

## 2023-04-26 LAB — HEMOGLOBIN A1C: Hgb A1c MFr Bld: 7.5 % — ABNORMAL HIGH (ref 4.6–6.5)

## 2023-04-26 LAB — HEPATIC FUNCTION PANEL
ALT: 22 U/L (ref 0–35)
AST: 19 U/L (ref 0–37)
Albumin: 4.4 g/dL (ref 3.5–5.2)
Alkaline Phosphatase: 72 U/L (ref 39–117)
Bilirubin, Direct: 0.1 mg/dL (ref 0.0–0.3)
Total Bilirubin: 0.6 mg/dL (ref 0.2–1.2)
Total Protein: 7 g/dL (ref 6.0–8.3)

## 2023-04-26 LAB — VITAMIN D 25 HYDROXY (VIT D DEFICIENCY, FRACTURES): VITD: 38.95 ng/mL (ref 30.00–100.00)

## 2023-04-26 MED ORDER — MELOXICAM 15 MG PO TABS: 15.0000 mg | ORAL_TABLET | Freq: Every day | ORAL | 3 refills | Status: DC | PRN

## 2023-04-26 MED ORDER — METFORMIN HCL ER 500 MG PO TB24: 1000.0000 mg | ORAL_TABLET | Freq: Every day | ORAL | 3 refills | Status: DC

## 2023-04-26 NOTE — Patient Instructions (Addendum)
You had the flu shot today  You will be contacted regarding the referral for: Dr Jean Rosenthal   - sports medicine for the right shoulder  Please continue all other medications as before, and refills have been done if requested.  Please have the pharmacy call with any other refills you may need.  Please continue your efforts at being more active, low cholesterol diet, and weight control.  Please keep your appointments with your specialists as you may have planned  Please go to the XRAY Department in the first floor for the x-ray testing  Please go to the LAB at the blood drawing area for the tests to be done  You will be contacted by phone if any changes need to be made immediately.  Otherwise, you will receive a letter about your results with an explanation, but please check with MyChart first.  Please make an Appointment to return in 6 months, or sooner if needed

## 2023-04-26 NOTE — Progress Notes (Unsigned)
Patient ID: Jennifer Waters, female   DOB: [birth year], 62 y.o.   MRN: [id number]        Chief Complaint: follow up recent ED visit for right shoulder pain, dm, low vit d, htn       HPI:  Jennifer Waters is a 61 y.o. female here after recent ED visit with right shoulder pain tx with prednisone course and IM toradol; also incidentally helped left index finger mcp pain and finger numbness but right shoulder pain only slightly improved; still has FROM at times with active motion but o/w has 5/10 pain worse to abduction and forward elevation; Pt denies chest pain, increased sob or doe, wheezing, orthopnea, PND, increased LE swelling, palpitations, dizziness or syncope.   Pt denies polydipsia, polyuria, or new focal neuro s/s.    Pt denies fever, wt loss, night sweats, loss of appetite, or other constitutional symptoms         Wt Readings from Last 3 Encounters:  04/26/23 239 lb (108.4 kg)  02/24/23 240 lb (108.9 kg)  02/10/23 240 lb (108.9 kg)   BP Readings from Last 3 Encounters:  04/26/23 126/78  03/14/23 136/74  02/24/23 134/83         Past Medical History:  Diagnosis Date   Arthritis    Depression    Hypertension    Migraines    Obesity    Past Surgical History:  Procedure Laterality Date   ABDOMINAL HYSTERECTOMY     BREAST MASS EXCISION     benign done as a child   COLONOSCOPY  01/2019   TRANSTHORACIC ECHOCARDIOGRAM  12/2018    EF 55-60%.  Impaired relaxation but indeterminate filling pressures.  Normal RV size and function.  Normal valves.  Normal aortic root.    reports that she quit smoking about 23 months ago. Her smoking use included cigarettes. She started smoking about 21 years ago. She has a 10 pack-year smoking history. She has never used smokeless tobacco. She reports that she does not currently use alcohol after a past usage of about 5.0 standard drinks of alcohol per week. She reports current drug use. Frequency: 1.00 time per week. Drug: Marijuana. family history  includes Arrhythmia in her sister; Arthritis in her mother and sister; Brain cancer in her brother; Diabetes in her brother; Edema in her sister; Lung cancer in her father; Migraines in her mother; Ovarian cancer in her mother. Allergies  Allergen Reactions   Chantix [Varenicline] Nausea Only   Current Outpatient Medications on File Prior to Visit  Medication Sig Dispense Refill   Acetaminophen 500 MG capsule 1 capsule as needed Orally every 6 hrs     albuterol (VENTOLIN HFA) 108 (90 Base) MCG/ACT inhaler Inhale 2 puffs into the lungs every 6 (six) hours as needed for wheezing or shortness of breath. 6.7 g 2   amLODipine (NORVASC) 10 MG tablet Take 1 tablet (10 mg total) by mouth daily. 90 tablet 3   atorvastatin (LIPITOR) 10 MG tablet Take 1 tablet (10 mg total) by mouth daily. 90 tablet 3   cetirizine (ZYRTEC) 10 MG tablet 1 tablet     Cholecalciferol (VITAMIN D3 GUMMIES PO) Take by mouth.     Cholecalciferol 50 MCG (2000 UT) TABS 1 tab by mouth once daily 30 tablet 99   cyclobenzaprine (FLEXERIL) 5 MG tablet Take 1 tablet (5 mg total) by mouth 3 (three) times daily as needed for muscle spasms. 270 tablet 1   fluticasone (FLONASE) 50 MCG/ACT nasal spray 1  spray in each nostril     losartan-hydrochlorothiazide (HYZAAR) 50-12.5 MG tablet Take 1 tablet by mouth daily. 90 tablet 3   omeprazole (PRILOSEC) 20 MG capsule TAKE 1 CAPSULE BY MOUTH EVERY DAY 90 capsule 0   venlafaxine XR (EFFEXOR-XR) 150 MG 24 hr capsule TAKE 1 CAPSULE BY MOUTH ONCE DAILY WITH BREAKFAST 90 capsule 2   divalproex (DEPAKOTE) 500 MG DR tablet Take by mouth. (Patient not taking: Reported on 07/21/2022)     EMGALITY 120 MG/ML SOAJ Inject into the skin. (Patient not taking: Reported on 07/21/2022)     gabapentin (NEURONTIN) 100 MG capsule Take 2 capsules (200 mg total) by mouth at bedtime. (Patient not taking: Reported on 07/21/2022) 180 capsule 1   rizatriptan (MAXALT) 10 MG tablet Take by mouth. (Patient not taking: Reported  on 02/24/2023)     valACYclovir (VALTREX) 500 MG tablet 1 tablet (Patient not taking: Reported on 02/24/2023)     Current Facility-Administered Medications on File Prior to Visit  Medication Dose Route Frequency Provider Last Rate Last Admin   0.9 %  sodium chloride infusion  500 mL Intravenous Once Jenel Lucks, MD            ROS:  All others reviewed and negative.  Objective        PE:  BP 126/78 (BP Location: Left Arm, Patient Position: Sitting, Cuff Size: Normal)   Pulse 100   Temp 98.4 F (36.9 C) (Oral)   Ht 5\' 9"  (1.753 m)   Wt 239 lb (108.4 kg)   SpO2 95%   BMI 35.29 kg/m                 Constitutional: Pt appears in NAD               HENT: Head: NCAT.                Right Ear: External ear normal.                 Left Ear: External ear normal.                Eyes: . Pupils are equal, round, and reactive to light. Conjunctivae and EOM are normal               Nose: without d/c or deformity               Neck: Neck supple. Gross normal ROM               Cardiovascular: Normal rate and regular rhythm.                 Pulmonary/Chest: Effort normal and breath sounds without rales or wheezing.                Abd:  Soft, NT, ND, + BS, no organomegaly               Neurological: Pt is alert. At baseline orientation, motor grossly intact               Skin: Skin is warm. No rashes, no other new lesions, LE edema - none;  right shoulder with FROM and nontender subacromial               Psychiatric: Pt behavior is normal without agitation   Micro: none  Cardiac tracings I have personally interpreted today:  none  Pertinent Radiological findings (summarize): none   Lab Results  Component Value Date  WBC 12.5 (H) 09/22/2022   HGB 14.3 09/22/2022   HCT 43.1 09/22/2022   PLT 282.0 09/22/2022   GLUCOSE 147 (H) 04/26/2023   CHOL 112 04/26/2023   TRIG 106.0 04/26/2023   HDL 42.30 04/26/2023   LDLCALC 49 04/26/2023   ALT 22 04/26/2023   AST 19 04/26/2023   NA 139  04/26/2023   K 3.9 04/26/2023   CL 103 04/26/2023   CREATININE 1.00 04/26/2023   BUN 18 04/26/2023   CO2 30 04/26/2023   TSH 0.89 09/22/2022   HGBA1C 7.5 (H) 04/26/2023   MICROALBUR <0.7 09/22/2022   Assessment/Plan:  Jennifer Waters is a 62 y.o. Black or African American [2] female with  has a past medical history of Arthritis, Depression, Hypertension, Migraines, and Obesity.  Vitamin D deficiency Last vitamin D Lab Results  Component Value Date   VD25OH 38.95 04/26/2023   Low, to start oral replacement   Hyperlipidemia due to dietary fat intake Lab Results  Component Value Date   LDLCALC 49 04/26/2023   Stable, pt to continue current statin lipitor 10 mg qd   Essential hypertension BP Readings from Last 3 Encounters:  04/26/23 126/78  03/14/23 136/74  02/24/23 134/83   Stable, pt to continue medical treatment amlodipine 10 every day, hyzaar 50 12.5 qd   Diabetes (HCC) Lab Results  Component Value Date   HGBA1C 7.5 (H) 04/26/2023   Uncontrolled, pt to increase metforminER 500 mg  to 2 tab qd   Right shoulder pain Exam c/w likely underlying rot cuff tendonitis, for xray, refer sport medicine for possible cortisone inj  Followup: Return in about 6 months (around 10/24/2023).  Oliver Barre, MD 04/27/2023 7:07 AM [facility]

## 2023-04-27 ENCOUNTER — Encounter: Payer: Self-pay | Admitting: Internal Medicine

## 2023-04-27 NOTE — Assessment & Plan Note (Signed)
BP Readings from Last 3 Encounters:  04/26/23 126/78  03/14/23 136/74  02/24/23 134/83   Stable, pt to continue medical treatment amlodipine 10 every day, hyzaar 50 12.5 qd

## 2023-04-27 NOTE — Assessment & Plan Note (Signed)
Exam c/w likely underlying rot cuff tendonitis, for xray, refer sport medicine for possible cortisone inj

## 2023-04-27 NOTE — Progress Notes (Unsigned)
    Jennifer [facility] [address] [place] [address] Phone: [phone]   Assessment and Plan:     There are no diagnoses linked to this encounter.  ***   Pertinent previous records reviewed include ***   Follow Up: ***     Subjective:   I, Jennifer Waters, am serving as a Neurosurgeon for Doctor Jennifer Waters  Chief Complaint: right shoulder pain   HPI:   04/28/2023 Patient is a 62 year old female with concerns of right shoulder pain. Patient states  Relevant Historical Information: ***  Additional pertinent review of systems negative.   Current Outpatient Medications:    Acetaminophen 500 MG capsule, 1 capsule as needed Orally every 6 hrs, Disp: , Rfl:    albuterol (VENTOLIN HFA) 108 (90 Base) MCG/ACT inhaler, Inhale 2 puffs into the lungs every 6 (six) hours as needed for wheezing or shortness of breath., Disp: 6.7 g, Rfl: 2   amLODipine (NORVASC) 10 MG tablet, Take 1 tablet (10 mg total) by mouth daily., Disp: 90 tablet, Rfl: 3   atorvastatin (LIPITOR) 10 MG tablet, Take 1 tablet (10 mg total) by mouth daily., Disp: 90 tablet, Rfl: 3   cetirizine (ZYRTEC) 10 MG tablet, 1 tablet, Disp: , Rfl:    Cholecalciferol (VITAMIN D3 GUMMIES PO), Take by mouth., Disp: , Rfl:    Cholecalciferol 50 MCG (2000 UT) TABS, 1 tab by mouth once daily, Disp: 30 tablet, Rfl: 99   cyclobenzaprine (FLEXERIL) 5 MG tablet, Take 1 tablet (5 mg total) by mouth 3 (three) times daily as needed for muscle spasms., Disp: 270 tablet, Rfl: 1   divalproex (DEPAKOTE) 500 MG DR tablet, Take by mouth. (Patient not taking: Reported on 07/21/2022), Disp: , Rfl:    EMGALITY 120 MG/ML SOAJ, Inject into the skin. (Patient not taking: Reported on 07/21/2022), Disp: , Rfl:    fluticasone (FLONASE) 50 MCG/ACT nasal spray, 1 spray in each nostril, Disp: , Rfl:    gabapentin (NEURONTIN) 100 MG capsule, Take 2 capsules (200 mg total) by mouth at bedtime. (Patient not taking:  Reported on 07/21/2022), Disp: 180 capsule, Rfl: 1   losartan-hydrochlorothiazide (HYZAAR) 50-12.5 MG tablet, Take 1 tablet by mouth daily., Disp: 90 tablet, Rfl: 3   meloxicam (MOBIC) 15 MG tablet, Take 1 tablet (15 mg total) by mouth daily as needed for pain., Disp: 90 tablet, Rfl: 3   metFORMIN (GLUCOPHAGE-XR) 500 MG 24 hr tablet, Take 2 tablets (1,000 mg total) by mouth daily with breakfast., Disp: 180 tablet, Rfl: 3   omeprazole (PRILOSEC) 20 MG capsule, TAKE 1 CAPSULE BY MOUTH EVERY DAY, Disp: 90 capsule, Rfl: 0   rizatriptan (MAXALT) 10 MG tablet, Take by mouth. (Patient not taking: Reported on 02/24/2023), Disp: , Rfl:    valACYclovir (VALTREX) 500 MG tablet, 1 tablet (Patient not taking: Reported on 02/24/2023), Disp: , Rfl:    venlafaxine XR (EFFEXOR-XR) 150 MG 24 hr capsule, TAKE 1 CAPSULE BY MOUTH ONCE DAILY WITH BREAKFAST, Disp: 90 capsule, Rfl: 2  Current Facility-Administered Medications:    0.9 %  sodium chloride infusion, 500 mL, Intravenous, Once, Jenel Lucks, MD   Objective:     There were no vitals filed for this visit.    There is no height or weight on file to calculate BMI.    Physical Exam:    ***   Electronically signed by:  Jennifer Waters [facility] 7:24 AM 04/27/23

## 2023-04-27 NOTE — Assessment & Plan Note (Signed)
Last vitamin D Lab Results  Component Value Date   VD25OH 38.95 04/26/2023   Low, to start oral replacement

## 2023-04-27 NOTE — Assessment & Plan Note (Signed)
Lab Results  Component Value Date   HGBA1C 7.5 (H) 04/26/2023   Uncontrolled, pt to increase metforminER 500 mg  to 2 tab qd

## 2023-04-27 NOTE — Assessment & Plan Note (Signed)
Lab Results  Component Value Date   LDLCALC 49 04/26/2023   Stable, pt to continue current statin lipitor 10 mg qd

## 2023-04-28 ENCOUNTER — Ambulatory Visit (INDEPENDENT_AMBULATORY_CARE_PROVIDER_SITE_OTHER): Payer: Medicare Other | Admitting: Sports Medicine

## 2023-04-28 VITALS — BP 126/78 | HR 68 | Ht 69.0 in | Wt 239.0 lb

## 2023-04-28 DIAGNOSIS — M25512 Pain in left shoulder: Secondary | ICD-10-CM

## 2023-04-28 DIAGNOSIS — M7552 Bursitis of left shoulder: Secondary | ICD-10-CM | POA: Diagnosis not present

## 2023-04-28 DIAGNOSIS — M1711 Unilateral primary osteoarthritis, right knee: Secondary | ICD-10-CM

## 2023-04-28 DIAGNOSIS — G8929 Other chronic pain: Secondary | ICD-10-CM

## 2023-04-28 DIAGNOSIS — M25561 Pain in right knee: Secondary | ICD-10-CM

## 2023-04-28 DIAGNOSIS — M19012 Primary osteoarthritis, left shoulder: Secondary | ICD-10-CM | POA: Diagnosis not present

## 2023-04-28 NOTE — Patient Instructions (Signed)
Shoulder and knee HEP  4 week follow up

## 2023-05-15 ENCOUNTER — Encounter: Payer: Self-pay | Admitting: Internal Medicine

## 2023-05-25 NOTE — Progress Notes (Unsigned)
Jennifer Waters D.[facility] [address] [place] [address] Phone: [phone]   Assessment and Plan:     There are no diagnoses linked to this encounter.  ***   Pertinent previous records reviewed include ***    Follow Up: ***     Subjective:   I, Jennifer Waters, am serving as a Neurosurgeon for Doctor Richardean Sale   Chief Complaint: right shoulder pain    HPI:    04/28/2023 Patient is a 62 year old female with concerns of right shoulder pain. Patient states  that she has shoulder pain for about a month. She went to [facility] and was given a Toradol  injection and prednisone , still has pain. Decreased ROM rates pain a 5/10.    Would like a CSI in her right knee as well has increased her walking   05/26/2023 Patient states   Relevant Historical Information: Hypertension, GERD, DM type II  Additional pertinent review of systems negative.   Current Outpatient Medications:    Acetaminophen 500 MG capsule, 1 capsule as needed Orally every 6 hrs, Disp: , Rfl:    albuterol (VENTOLIN HFA) 108 (90 Base) MCG/ACT inhaler, Inhale 2 puffs into the lungs every 6 (six) hours as needed for wheezing or shortness of breath., Disp: 6.7 g, Rfl: 2   amLODipine (NORVASC) 10 MG tablet, Take 1 tablet (10 mg total) by mouth daily., Disp: 90 tablet, Rfl: 3   atorvastatin (LIPITOR) 10 MG tablet, Take 1 tablet (10 mg total) by mouth daily., Disp: 90 tablet, Rfl: 3   cetirizine (ZYRTEC) 10 MG tablet, 1 tablet, Disp: , Rfl:    Cholecalciferol (VITAMIN D3 GUMMIES PO), Take by mouth., Disp: , Rfl:    Cholecalciferol 50 MCG (2000 UT) TABS, 1 tab by mouth once daily, Disp: 30 tablet, Rfl: 99   cyclobenzaprine (FLEXERIL) 5 MG tablet, Take 1 tablet (5 mg total) by mouth 3 (three) times daily as needed for muscle spasms., Disp: 270 tablet, Rfl: 1   divalproex (DEPAKOTE) 500 MG DR tablet, Take by mouth. (Patient not taking: Reported on 07/21/2022), Disp: , Rfl:    EMGALITY  120 MG/ML SOAJ, Inject into the skin. (Patient not taking: Reported on 07/21/2022), Disp: , Rfl:    fluticasone (FLONASE) 50 MCG/ACT nasal spray, 1 spray in each nostril, Disp: , Rfl:    gabapentin (NEURONTIN) 100 MG capsule, Take 2 capsules (200 mg total) by mouth at bedtime. (Patient not taking: Reported on 07/21/2022), Disp: 180 capsule, Rfl: 1   losartan-hydrochlorothiazide (HYZAAR) 50-12.5 MG tablet, Take 1 tablet by mouth daily., Disp: 90 tablet, Rfl: 3   meloxicam (MOBIC) 15 MG tablet, Take 1 tablet (15 mg total) by mouth daily as needed for pain., Disp: 90 tablet, Rfl: 3   metFORMIN (GLUCOPHAGE-XR) 500 MG 24 hr tablet, Take 2 tablets (1,000 mg total) by mouth daily with breakfast., Disp: 180 tablet, Rfl: 3   omeprazole (PRILOSEC) 20 MG capsule, TAKE 1 CAPSULE BY MOUTH EVERY DAY, Disp: 90 capsule, Rfl: 0   rizatriptan (MAXALT) 10 MG tablet, Take by mouth. (Patient not taking: Reported on 02/24/2023), Disp: , Rfl:    valACYclovir (VALTREX) 500 MG tablet, 1 tablet (Patient not taking: Reported on 02/24/2023), Disp: , Rfl:    venlafaxine XR (EFFEXOR-XR) 150 MG 24 hr capsule, TAKE 1 CAPSULE BY MOUTH ONCE DAILY WITH BREAKFAST, Disp: 90 capsule, Rfl: 2  Current Facility-Administered Medications:    0.9 %  sodium chloride infusion, 500 mL, Intravenous, Once,  Jenel Lucks, MD   Objective:     There were no vitals filed for this visit.    There is no height or weight on file to calculate BMI.    Physical Exam:    ***   Electronically signed by:  Jennifer Waters D.[facility] 7:28 AM 05/25/23

## 2023-05-26 ENCOUNTER — Ambulatory Visit: Payer: Medicare Other | Admitting: Sports Medicine

## 2023-05-26 VITALS — BP 122/82 | Ht 69.0 in | Wt 238.0 lb

## 2023-05-26 DIAGNOSIS — G8929 Other chronic pain: Secondary | ICD-10-CM

## 2023-05-26 DIAGNOSIS — M19012 Primary osteoarthritis, left shoulder: Secondary | ICD-10-CM | POA: Diagnosis not present

## 2023-05-26 DIAGNOSIS — M25512 Pain in left shoulder: Secondary | ICD-10-CM | POA: Diagnosis not present

## 2023-05-26 DIAGNOSIS — M2550 Pain in unspecified joint: Secondary | ICD-10-CM

## 2023-05-26 DIAGNOSIS — M25561 Pain in right knee: Secondary | ICD-10-CM | POA: Diagnosis not present

## 2023-05-26 DIAGNOSIS — M1711 Unilateral primary osteoarthritis, right knee: Secondary | ICD-10-CM | POA: Diagnosis not present

## 2023-05-26 NOTE — Patient Instructions (Signed)
Tylenol [id number] mg 2-3 times a day for pain relief  Meloxicam 15 mg daily as needed for breakthrough pain  Recommend limiting chronic NSAID to 1-2 time per week  Water aerobics, stationary bike, elliptical  As needed follow up if no improvement 3-4 week follow up

## 2023-06-18 ENCOUNTER — Other Ambulatory Visit: Payer: Self-pay | Admitting: Internal Medicine

## 2023-06-18 ENCOUNTER — Ambulatory Visit
Admission: EM | Admit: 2023-06-18 | Discharge: 2023-06-18 | Disposition: A | Discharge status: Home / Self Care | Payer: Medicare Other | Attending: Physician Assistant | Admitting: Physician Assistant

## 2023-06-18 DIAGNOSIS — R252 Cramp and spasm: Secondary | ICD-10-CM

## 2023-06-18 DIAGNOSIS — Z760 Encounter for issue of repeat prescription: Secondary | ICD-10-CM

## 2023-06-18 MED ORDER — CYCLOBENZAPRINE HCL 5 MG PO TABS: 5.0000 mg | ORAL_TABLET | Freq: Three times a day (TID) | ORAL | 0 refills | Status: DC | PRN

## 2023-06-18 NOTE — ED Provider Notes (Signed)
[facility]    CSN: [id number] Arrival date & time: 06/18/23  1309      History   Chief Complaint No chief complaint on file.   HPI JOYCELIN MCNATT is a 62 y.o. female.   Patient presents today for medication refill.  She has a several year history of widespread muscle aches/cramps that occur without identifiable trigger.  She has been worked up by her primary care and had significant blood work and other testing that was all negative.  She does take a statin but reports she has been stable on this medication for many years and that the muscle spasms predate use of this medication.  She typically manages this with at least 1 daily cyclobenzaprine 5 mg but will sometimes take up to 3 a day.  She is out of this medication at her doctor's office is closed prompting evaluation today.  She reports her symptoms are adequately controlled with this medication.  She denies any additional symptoms including fever, nausea, vomiting.  No additional complaints or concerns today.    Past Medical History:  Diagnosis Date   Arthritis    Depression    Hypertension    Migraines    Obesity     Patient Active Problem List   Diagnosis Date Noted   History of colonic polyps 09/22/2022   Onychomycosis 03/20/2022   Polyarthralgia 03/20/2022   Primary localized osteoarthrosis of multiple sites 03/19/2022   Muscle cramp 03/19/2022   Diabetes (HCC) 09/18/2021   Dysphagia 02/21/2021   Right hip pain 02/21/2021   Hyperlipidemia due to dietary fat intake 08/03/2020   Chronic headache 03/01/2020   Asthma 12/23/2019   Muscle cramping 03/07/2019   Scapular dyskinesis 02/21/2019   Patellofemoral arthritis of right knee 01/22/2019   Right shoulder pain 12/22/2018   Vitamin D deficiency 12/20/2018   Cervical radiculopathy at C6 12/20/2018   Syncope 12/20/2018   Nonallopathic lesion of cervical region 12/02/2017   Nonallopathic lesion of thoracic region 12/02/2017   Nonallopathic  lesion of rib cage 12/02/2017   Encounter for well adult exam with abnormal findings 11/04/2017   Tobacco abuse 07/08/2017   Anxiety and depression 11/22/2016   Cervical disc disorder with radiculopathy of cervical region 05/04/2016   Cough 03/15/2016   Snoring 12/11/2015   GERD (gastroesophageal reflux disease) 12/11/2015   Allergic rhinitis 05/01/2015   Osteoarthritis 10/17/2014   Essential hypertension 10/17/2014    Past Surgical History:  Procedure Laterality Date   ABDOMINAL HYSTERECTOMY     BREAST MASS EXCISION     benign done as a child   COLONOSCOPY  01/2019   TRANSTHORACIC ECHOCARDIOGRAM  12/2018    EF 55-60%.  Impaired relaxation but indeterminate filling pressures.  Normal RV size and function.  Normal valves.  Normal aortic root.    OB History   No obstetric history on file.      Home Medications    Prior to Admission medications   Medication Sig Start Date End Date Taking? Authorizing Provider  Acetaminophen 500 MG capsule 1 capsule as needed Orally every 6 hrs    [provider]  albuterol (VENTOLIN HFA) 108 (90 Base) MCG/ACT inhaler Inhale 2 puffs into the lungs every 6 (six) hours as needed for wheezing or shortness of breath. 11/21/19   Olive Bass, FNP  amLODipine (NORVASC) 10 MG tablet Take 1 tablet (10 mg total) by mouth daily. 07/21/22   Joylene Grapes, NP  atorvastatin (LIPITOR) 10 MG tablet Take 1 tablet (10  mg total) by mouth daily. 07/21/22   Joylene Grapes, NP  cetirizine (ZYRTEC) 10 MG tablet 1 tablet    [provider]  Cholecalciferol (VITAMIN D3 GUMMIES PO) Take by mouth.    [provider]  Cholecalciferol 50 MCG (2000 UT) TABS 1 tab by mouth once daily 02/17/21   Corwin Levins, MD  cyclobenzaprine (FLEXERIL) 5 MG tablet Take 1 tablet (5 mg total) by mouth 3 (three) times daily as needed for muscle spasms. 06/18/23   Zoiey Christy, Denny Peon K, PA-C  divalproex (DEPAKOTE) 500 MG DR tablet Take by mouth. 08/07/20   [provider]  EMGALITY 120 MG/ML SOAJ Inject into the skin. 08/14/20   [provider]  fluticasone (FLONASE) 50 MCG/ACT nasal spray 1 spray in each nostril    [provider]  gabapentin (NEURONTIN) 100 MG capsule Take 2 capsules (200 mg total) by mouth at bedtime. 02/28/20   Corwin Levins, MD  losartan-hydrochlorothiazide (HYZAAR) 50-12.5 MG tablet Take 1 tablet by mouth daily. 07/21/22   Joylene Grapes, NP  meloxicam (MOBIC) 15 MG tablet Take 1 tablet (15 mg total) by mouth daily as needed for pain. 04/26/23   Corwin Levins, MD  metFORMIN (GLUCOPHAGE-XR) 500 MG 24 hr tablet Take 2 tablets (1,000 mg total) by mouth daily with breakfast. 04/26/23   Corwin Levins, MD  omeprazole (PRILOSEC) 20 MG capsule TAKE 1 CAPSULE BY MOUTH EVERY DAY 06/23/17   Dragnev, Alphonse Guild, NP  rizatriptan (MAXALT) 10 MG tablet Take by mouth. 11/04/20   [provider]  valACYclovir (VALTREX) 500 MG tablet     [provider]  venlafaxine XR (EFFEXOR-XR) 150 MG 24 hr capsule TAKE 1 CAPSULE BY MOUTH ONCE DAILY WITH BREAKFAST 01/04/23   Corwin Levins, MD    Family History Family History  Problem Relation Age of Onset   Arthritis Mother    Ovarian cancer Mother    Migraines Mother    Lung cancer Father    Arrhythmia Sister    Brain cancer Brother    Edema Sister    Arthritis Sister    Diabetes Brother    Colon polyps Neg Hx    Colon cancer Neg Hx    Esophageal cancer Neg Hx    Rectal cancer Neg Hx    Stomach cancer Neg Hx     Social History Social History   Tobacco Use   Smoking status: Former    Current packs/day: 0.00    Average packs/day: 0.5 packs/day for 20.0 years (10.0 ttl pk-yrs)    Types: Cigarettes    Start date: 05/21/2001    Quit date: 05/21/2021    Years since quitting: 2.0   Smokeless tobacco: Never  Vaping Use   Vaping status: Never Used  Substance Use Topics   Alcohol use: Not Currently    Alcohol/week: 5.0 standard drinks of alcohol    Types: 5  Glasses of wine per week   Drug use: Not Currently    Frequency: 1.0 times per week    Types: Marijuana     Allergies   Chantix [varenicline]   Review of Systems Review of Systems  Constitutional:  Positive for activity change. Negative for appetite change, fatigue and fever.  Gastrointestinal:  Negative for abdominal pain, diarrhea, nausea and vomiting.  Musculoskeletal:  Positive for myalgias. Negative for arthralgias.  Neurological:  Negative for dizziness, weakness, light-headedness, numbness and headaches.     Physical Exam Triage Vital Signs ED Triage Vitals  Encounter Vitals Group     BP 06/18/23 1422 131/85     Systolic BP Percentile --      Diastolic BP Percentile --      Pulse Rate 06/18/23 1422 93     Resp 06/18/23 1422 18     Temp 06/18/23 1422 97.9 F (36.6 C)     Temp Source 06/18/23 1422 Oral     SpO2 06/18/23 1422 98 %     Weight 06/18/23 1420 239 lb (108.4 kg)     Height 06/18/23 1420 5\' 9"  (1.753 m)     Head Circumference --      Peak Flow --      Pain Score 06/18/23 1420 4     Pain Loc --      Pain Education --      Exclude from Growth Chart --    No data found.  Updated Vital Signs BP 131/85 (BP Location: Left Arm)   Pulse 93   Temp 97.9 F (36.6 C) (Oral)   Resp 18   Ht 5\' 9"  (1.753 m)   Wt 239 lb (108.4 kg)   SpO2 98%   BMI 35.29 kg/m   Visual Acuity Right Eye Distance:   Left Eye Distance:   Bilateral Distance:    Right Eye Near:   Left Eye Near:    Bilateral Near:     Physical Exam Vitals reviewed.  Constitutional:      General: She is awake. She is not in acute distress.    Appearance: Normal appearance. She is well-developed. She is not ill-appearing.     Comments: Very pleasant female appears stated age in no acute distress sitting comfortably in exam room  HENT:     Head: Normocephalic and atraumatic.  Cardiovascular:     Rate and Rhythm: Normal rate and regular rhythm.     Heart sounds: Normal heart sounds, S1  normal and S2 normal. No murmur heard. Pulmonary:     Effort: Pulmonary effort is normal.     Breath sounds: Normal breath sounds. No wheezing, rhonchi or rales.     Comments: Clear to auscultation bilaterally Abdominal:     Palpations: Abdomen is soft.     Tenderness: There is no abdominal tenderness.  Musculoskeletal:     Comments: Strength 5/5 bilateral upper and lower extremities.  Psychiatric:        Behavior: Behavior is cooperative.      UC Treatments / Results  Labs (all labs ordered are listed, but only abnormal results are displayed) Labs Reviewed - No data to display  EKG   Radiology No results found.  Procedures Procedures (including critical care time)  Medications Ordered in UC Medications - No data to display  Initial Impression / Assessment and Plan / UC Course  I have reviewed the triage vital signs and the nursing notes.  Pertinent labs & imaging results that were available during my care of the patient were reviewed by me and considered in my medical decision making (see chart for details).     Patient is well-appearing, afebrile, nontoxic, nontachycardic.  Workup was deferred as she has had extensive workup and she reports that symptoms are at baseline.  She was encouraged to follow-up with her primary care for ongoing refills and evaluation.  She was encouraged to drink plenty of fluid and use conservative treatment measures including heat and gentle stretch.  Refill was provided we discussed that this medication can be sedating so she should not drive drink  alcohol with taking it.  Discussed that if she has any worsening or changing symptoms including extreme pain, fever, nausea, vomiting she needs to be seen immediately.  Final Clinical Impressions(s) / UC Diagnoses   Final diagnoses:  Muscle cramps  Encounter for medication refill     Discharge Instructions      I have refilled your Flexeril to be used as needed.  Make sure that you are  drinking plenty of fluid.  This can be sedating so do not drive or drink alcohol while taking it.  If you have any worsening or changing symptoms please return for reevaluation.     ED Prescriptions     Medication Sig Dispense Auth. Provider   cyclobenzaprine (FLEXERIL) 5 MG tablet Take 1 tablet (5 mg total) by mouth 3 (three) times daily as needed for muscle spasms. 30 tablet Maira Christon, Noberto Retort, PA-C      PDMP not reviewed this encounter.   Jeani Hawking, PA-C 06/18/23 1453

## 2023-06-18 NOTE — Discharge Instructions (Signed)
I have refilled your Flexeril to be used as needed.  Make sure that you are drinking plenty of fluid.  This can be sedating so do not drive or drink alcohol while taking it.  If you have any worsening or changing symptoms please return for reevaluation.

## 2023-06-18 NOTE — ED Triage Notes (Signed)
Patient states she would like a refill on Cyclobenzaprine 5 mg. Patient states is having cramping in abdomen and hips, took last pill last night.

## 2023-06-23 ENCOUNTER — Telehealth: Payer: Self-pay

## 2023-06-23 ENCOUNTER — Ambulatory Visit: Payer: Medicare Other

## 2023-06-23 VITALS — Ht 69.0 in | Wt 239.0 lb

## 2023-06-23 DIAGNOSIS — Z0000 Encounter for general adult medical examination without abnormal findings: Secondary | ICD-10-CM | POA: Diagnosis not present

## 2023-06-23 MED ORDER — CYCLOBENZAPRINE HCL 5 MG PO TABS: 5.0000 mg | ORAL_TABLET | Freq: Three times a day (TID) | ORAL | 0 refills | Status: DC | PRN

## 2023-06-23 NOTE — Telephone Encounter (Signed)
 Ok done

## 2023-06-23 NOTE — Telephone Encounter (Signed)
 Ok this is done

## 2023-06-23 NOTE — Patient Instructions (Signed)
 Jennifer Waters , Thank you for taking time to come for your Medicare Wellness Visit. I appreciate your ongoing commitment to your health goals. Please review the following plan we discussed and let me know if I can assist you in the future.   Referrals/Orders/Follow-Ups/Clinician Recommendations: I placed a refill request for you today.  Your pharmacy should contact you soon.  Keep up mthe good work and Happy New Year.  This is a list of the screening recommended for you and due dates:  Health Maintenance  Topic Date Due   Yearly kidney health urinalysis for diabetes  09/22/2023   Complete foot exam   09/22/2023   Hemoglobin A1C  10/24/2023   Eye exam for diabetics  10/25/2023   Yearly kidney function blood test for diabetes  04/25/2024   Medicare Annual Wellness Visit  06/22/2024   Mammogram  10/18/2024   Colon Cancer Screening  02/23/2026   DTaP/Tdap/Td vaccine (2 - Td or Tdap) 06/04/2027   Flu Shot  Completed   Hepatitis C Screening  Completed   HIV Screening  Completed   Zoster (Shingles) Vaccine  Completed   HPV Vaccine  Aged Out   COVID-19 Vaccine  Discontinued    Advanced directives: (Declined) Advance directive discussed with you today. Even though you declined this today, please call our office should you change your mind, and we can give you the proper paperwork for you to fill out.  Next Medicare Annual Wellness Visit scheduled for next year: Yes

## 2023-06-23 NOTE — Telephone Encounter (Signed)
 Patient is requesting a refill for Cyclobenzaprine 5 mg.  Patient stated that she ended up going to the Urgent care last week due to muscle  spasms and they give a RX for just 10 days.  She had no other concerns to address today.

## 2023-06-23 NOTE — Progress Notes (Addendum)
 Subjective:   Jennifer Waters is a 63 y.o. female who presents for an Initial Medicare Annual Wellness Visit.  Visit Complete: Virtual I connected with  Montie VEAR Cleveland on 06/23/23 by a audio enabled telemedicine application and verified that I am speaking with the correct person using two identifiers.  Patient Location: Home  Provider Location: Office/Clinic  I discussed the limitations of evaluation and management by telemedicine. The patient expressed understanding and agreed to proceed.  Vital Signs: Because this visit was a virtual/telehealth visit, some criteria may be missing or patient reported. Any vitals not documented were not able to be obtained and vitals that have been documented are patient reported.   Cardiac Risk Factors include: advanced age (>56men, >25 women);hypertension;Other (see comment);diabetes mellitus, Risk factor comments: asthma     Objective:    Today's Vitals   06/23/23 0926  Weight: 239 lb (108.4 kg)  Height: 5' 9 (1.753 m)  PainSc: 7    Body mass index is 35.29 kg/m.     06/23/2023    9:37 AM 06/18/2023    2:22 PM 06/24/2021    5:00 AM 03/17/2016    4:42 AM  Advanced Directives  Does Patient Have a Medical Advance Directive? No No No No  Would patient like information on creating a medical advance directive?   No - Patient declined No - patient declined information    Current Medications (verified) Outpatient Encounter Medications as of 06/23/2023  Medication Sig   Acetaminophen  500 MG capsule 1 capsule as needed Orally every 6 hrs   albuterol  (VENTOLIN  HFA) 108 (90 Base) MCG/ACT inhaler Inhale 2 puffs into the lungs every 6 (six) hours as needed for wheezing or shortness of breath.   amLODipine  (NORVASC ) 10 MG tablet Take 1 tablet (10 mg total) by mouth daily.   atorvastatin  (LIPITOR) 10 MG tablet Take 1 tablet (10 mg total) by mouth daily.   cetirizine  (ZYRTEC ) 10 MG tablet 1 tablet   Cholecalciferol  (VITAMIN D3 GUMMIES PO) Take  by mouth.   Cholecalciferol  50 MCG (2000 UT) TABS 1 tab by mouth once daily   cyclobenzaprine  (FLEXERIL ) 5 MG tablet Take 1 tablet (5 mg total) by mouth 3 (three) times daily as needed for muscle spasms.   divalproex (DEPAKOTE) 500 MG DR tablet Take by mouth.   EMGALITY 120 MG/ML SOAJ Inject into the skin.   fluticasone  (FLONASE ) 50 MCG/ACT nasal spray 1 spray in each nostril   gabapentin  (NEURONTIN ) 100 MG capsule Take 2 capsules (200 mg total) by mouth at bedtime.   losartan -hydrochlorothiazide  (HYZAAR) 50-12.5 MG tablet Take 1 tablet by mouth daily.   meloxicam  (MOBIC ) 15 MG tablet Take 1 tablet (15 mg total) by mouth daily as needed for pain.   metFORMIN  (GLUCOPHAGE -XR) 500 MG 24 hr tablet Take 2 tablets (1,000 mg total) by mouth daily with breakfast.   omeprazole  (PRILOSEC) 20 MG capsule TAKE 1 CAPSULE BY MOUTH EVERY DAY   rizatriptan (MAXALT) 10 MG tablet Take by mouth.   valACYclovir  (VALTREX ) 500 MG tablet    venlafaxine  XR (EFFEXOR -XR) 150 MG 24 hr capsule TAKE 1 CAPSULE BY MOUTH ONCE DAILY WITH BREAKFAST   Facility-Administered Encounter Medications as of 06/23/2023  Medication   0.9 %  sodium chloride  infusion    Allergies (verified) Chantix  [varenicline ]   History: Past Medical History:  Diagnosis Date   Arthritis    Depression    Hypertension    Migraines    Obesity    Past Surgical History:  Procedure Laterality Date   ABDOMINAL HYSTERECTOMY     BREAST MASS EXCISION     benign done as a child   COLONOSCOPY  01/2019   TRANSTHORACIC ECHOCARDIOGRAM  12/2018    EF 55-60%.  Impaired relaxation but indeterminate filling pressures.  Normal RV size and function.  Normal valves.  Normal aortic root.   Family History  Problem Relation Age of Onset   Arthritis Mother    Ovarian cancer Mother    Migraines Mother    Lung cancer Father    Arrhythmia Sister    Brain cancer Brother    Edema Sister    Arthritis Sister    Diabetes Brother    Colon polyps Neg Hx    Colon  cancer Neg Hx    Esophageal cancer Neg Hx    Rectal cancer Neg Hx    Stomach cancer Neg Hx    Social History   Socioeconomic History   Marital status: Divorced    Spouse name: Not on file   Number of children: 1   Years of education: 14   Highest education level: Not on file  Occupational History   Occupation: Disablity  Tobacco Use   Smoking status: Former    Current packs/day: 0.00    Average packs/day: 0.5 packs/day for 20.0 years (10.0 ttl pk-yrs)    Types: Cigarettes    Start date: 05/21/2001    Quit date: 05/21/2021    Years since quitting: 2.0   Smokeless tobacco: Never  Vaping Use   Vaping status: Never Used  Substance and Sexual Activity   Alcohol use: Not Currently    Alcohol/week: 5.0 standard drinks of alcohol    Types: 5 Glasses of wine per week   Drug use: Not Currently    Frequency: 1.0 times per week    Types: Marijuana   Sexual activity: Yes    Birth control/protection: Surgical  Other Topics Concern   Not on file  Social History Narrative   Fun: Puzzles, cats   Denies any religious beliefs effecting health care. Lives with daughter in a one story home.  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  Education: some college.      Lives with her daughter and grandchild   Social Drivers of Health   Financial Resource Strain: Low Risk  (06/23/2023)   Overall Financial Resource Strain (CARDIA)    Difficulty of Paying Living Expenses: Not very hard  Food Insecurity: No Food Insecurity (06/23/2023)   Hunger Vital Sign    Worried About Running Out of Food in the Last Year: Never true    Ran Out of Food in the Last Year: Never true  Transportation Needs: No Transportation Needs (06/23/2023)   PRAPARE - Administrator, Civil Service (Medical): No    Lack of Transportation (Non-Medical): No  Physical Activity: Inactive (06/23/2023)   Exercise Vital Sign    Days of Exercise per Week: 0 days    Minutes of Exercise per Session: 0 min  Stress: No Stress Concern  Present (06/23/2023)   [employer] of Occupational Health - Occupational Stress Questionnaire    Feeling of Stress : Not at all  Social Connections: Socially Isolated (06/23/2023)   Social Connection and Isolation Panel [NHANES]    Frequency of Communication with Friends and Family: More than three times a week    Frequency of Social Gatherings with Friends and Family: Never    Attends Religious Services: Never    Database Administrator or  Organizations: No    Attends Engineer, Structural: Never    Marital Status: Divorced    Tobacco Counseling Counseling given: Not Answered   Clinical Intake:  Pre-visit preparation completed: Yes  Pain : 0-10 Pain Score: 7  Pain Type: Acute pain Pain Location: Hip Pain Orientation: Right Pain Onset: More than a month ago Pain Frequency: Intermittent Pain Relieving Factors: Tylenol  Effect of Pain on Daily Activities: walking  Pain Relieving Factors: Tylenol   BMI - recorded: 35.29 Nutritional Status: BMI > 30  Obese Nutritional Risks: None Diabetes: Yes CBG done?: No Did pt. bring in CBG monitor from home?: No  How often do you need to have someone help you when you read instructions, pamphlets, or other written materials from your doctor or pharmacy?: 1 - Never  Interpreter Needed?: No  Information entered by :: Jennifer Waters, RMA   Activities of Daily Living    06/23/2023    9:26 AM  In your present state of health, do you have any difficulty performing the following activities:  Hearing? 0  Vision? 0  Difficulty concentrating or making decisions? 0  Walking or climbing stairs? 0  Dressing or bathing? 0  Doing errands, shopping? 0  Comment daughter Insurance Claims Handler and eating ? N  Using the Toilet? N  In the past six months, have you accidently leaked urine? Y  Do you have problems with loss of bowel control? N  Managing your Medications? N  Managing your Finances? N  Housekeeping or managing your  Housekeeping? N    Patient Care Team: Norleen Lynwood ORN, MD as PCP - General (Internal Medicine) Anner Alm ORN, MD as PCP - Cardiology (Cardiology)  Indicate any recent Medical Services you may have received from other than [facility] providers in the past year (date may be approximate).     Assessment:   This is a routine wellness examination for Jennifer Waters.  Hearing/Vision screen Hearing Screening - Comments:: Denies hearing difficulties   Vision Screening - Comments:: Wears eyeglasses   Goals Addressed               This Visit's Progress     Patient Stated (pt-stated)        Would like to workout more      Depression Screen    06/23/2023    9:45 AM 04/26/2023    8:50 AM 09/22/2022    1:50 PM 03/19/2022    8:52 AM 09/18/2021   10:34 AM 09/18/2021   10:10 AM 08/21/2020    9:55 AM  PHQ 2/9 Scores  PHQ - 2 Score 0 0 0 0 0 0 0  PHQ- 9 Score 3  0 0       Fall Risk    06/23/2023    9:40 AM 04/26/2023    8:50 AM 09/22/2022    1:50 PM 03/19/2022    8:52 AM 09/18/2021   10:34 AM  Fall Risk   Falls in the past year? 0 0 0 1 1  Number falls in past yr: 0 0 0 1 0  Injury with Fall? 0 0 0 1 0  Risk for fall due to : No Fall Risks No Fall Risks No Fall Risks Impaired mobility   Follow up Falls prevention discussed;Falls evaluation completed Falls evaluation completed Falls evaluation completed Falls evaluation completed     MEDICARE RISK AT HOME: Medicare Risk at Home Any stairs in or around the home?: No Home free of loose throw rugs in walkways, pet  beds, electrical cords, etc?: Yes Adequate lighting in your home to reduce risk of falls?: Yes Life alert?: No Use of a cane, walker or w/c?: No Grab bars in the bathroom?: Yes Shower chair or bench in shower?: No Elevated toilet seat or a handicapped toilet?: No  TIMED UP AND GO:  Was the test performed? No    Cognitive Function:        06/23/2023    9:41 AM  6CIT Screen  What Year? 0 points  What month? 0 points  What time?  0 points  Count back from 20 0 points  Months in reverse 0 points  Repeat phrase 0 points  Total Score 0 points    Immunizations Immunization History  Administered Date(s) Administered   Influenza, Seasonal, Injecte, Preservative Fre 04/26/2023   Influenza,inj,Quad PF,6+ Mos 05/01/2015, 05/04/2016, 02/25/2017, 02/17/2018, 03/07/2019, 02/28/2020, 02/17/2021, 03/19/2022   PFIZER(Purple Top)SARS-COV-2 Vaccination 01/16/2020, 02/13/2020   PNEUMOCOCCAL CONJUGATE-20 09/18/2021   Tdap 06/03/2017   Zoster Recombinant(Shingrix ) 11/21/2019, 02/28/2020    TDAP status: Up to date  Flu Vaccine status: Up to date  Pneumococcal vaccine status: Up to date  Covid-19 vaccine status: Information provided on how to obtain vaccines.   Qualifies for Shingles Vaccine? Yes   Zostavax completed Yes   Shingrix  Completed?: Yes  Screening Tests Health Maintenance  Topic Date Due   Diabetic kidney evaluation - Urine ACR  09/22/2023   FOOT EXAM  09/22/2023   HEMOGLOBIN A1C  10/24/2023   OPHTHALMOLOGY EXAM  10/25/2023   Diabetic kidney evaluation - eGFR measurement  04/25/2024   Medicare Annual Wellness (AWV)  06/22/2024   MAMMOGRAM  10/18/2024   Colonoscopy  02/23/2026   DTaP/Tdap/Td (2 - Td or Tdap) 06/04/2027   INFLUENZA VACCINE  Completed   Hepatitis C Screening  Completed   HIV Screening  Completed   Zoster Vaccines- Shingrix   Completed   HPV VACCINES  Aged Out   COVID-19 Vaccine  Discontinued    Health Maintenance  There are no preventive care reminders to display for this patient.   Colorectal cancer screening: Type of screening: Colonoscopy. Completed 02/24/2023. Repeat every 3 years  Mammogram status: Completed 10/19/2022. Repeat every year   Lung Cancer Screening: (Low Dose CT Chest recommended if Age 56-80 years, 20 pack-year currently smoking OR have quit w/in 15years.) does not qualify.   Lung Cancer Screening Referral: N/A  Additional Screening:  Hepatitis C  Screening: does qualify; Completed 11/04/2017  Vision Screening: Recommended annual ophthalmology exams for early detection of glaucoma and other disorders of the eye. Is the patient up to date with their annual eye exam?  Yes  Who is the provider or what is the name of the office in which the patient attends annual eye exams? [facility] If pt is not established with a provider, would they like to be referred to a provider to establish care? No .   Dental Screening: Recommended annual dental exams for proper oral hygiene  Diabetic Foot Exam: Diabetic Foot Exam: Completed 09/22/2022  Community Resource Referral / Chronic Care Management: CRR required this visit?  No   CCM required this visit?  No     Plan:     I have personally reviewed and noted the following in the patient's chart:   Medical and social history Use of alcohol, tobacco or illicit drugs  Current medications and supplements including opioid prescriptions. Patient is not currently taking opioid prescriptions. Functional ability and status Nutritional status Physical activity Advanced directives List  of other physicians Hospitalizations, surgeries, and ER visits in previous 12 months Vitals Screenings to include cognitive, depression, and falls Referrals and appointments  In addition, I have reviewed and discussed with patient certain preventive protocols, quality metrics, and best practice recommendations. A written personalized care plan for preventive services as well as general preventive health recommendations were provided to patient.     Jennifer Waters, CMA   06/23/2023   After Visit Summary: (Mail) Due to this being a telephonic visit, the after visit summary with patients personalized plan was offered to patient via mail   Nurse Notes: Patient is up to date on all her health maintenance.  She is requesting a refill for Cyclobenzaprine  5 mg.  Patient stated that she ended up going to the  Urgent care last week due to muscle  spasms and they give a RX for just 10 days.  She had no other concerns to address today.

## 2023-07-12 ENCOUNTER — Other Ambulatory Visit: Payer: Self-pay | Admitting: Nurse Practitioner

## 2023-07-19 ENCOUNTER — Other Ambulatory Visit: Payer: Self-pay | Admitting: Internal Medicine

## 2023-07-19 ENCOUNTER — Other Ambulatory Visit: Payer: Self-pay

## 2023-07-26 ENCOUNTER — Ambulatory Visit: Payer: Medicare Other | Attending: Cardiology | Admitting: Cardiology

## 2023-07-26 ENCOUNTER — Encounter: Payer: Self-pay | Admitting: Cardiology

## 2023-07-26 VITALS — BP 106/74 | HR 87 | Ht 69.0 in | Wt 236.4 lb

## 2023-07-26 DIAGNOSIS — M25551 Pain in right hip: Secondary | ICD-10-CM

## 2023-07-26 DIAGNOSIS — M2550 Pain in unspecified joint: Secondary | ICD-10-CM | POA: Diagnosis not present

## 2023-07-26 DIAGNOSIS — E785 Hyperlipidemia, unspecified: Secondary | ICD-10-CM

## 2023-07-26 DIAGNOSIS — I10 Essential (primary) hypertension: Secondary | ICD-10-CM

## 2023-07-26 DIAGNOSIS — F32A Depression, unspecified: Secondary | ICD-10-CM

## 2023-07-26 DIAGNOSIS — E1169 Type 2 diabetes mellitus with other specified complication: Secondary | ICD-10-CM

## 2023-07-26 DIAGNOSIS — Z720 Tobacco use: Secondary | ICD-10-CM | POA: Diagnosis not present

## 2023-07-26 DIAGNOSIS — F419 Anxiety disorder, unspecified: Secondary | ICD-10-CM | POA: Diagnosis not present

## 2023-07-26 NOTE — Patient Instructions (Signed)
Medication Instructions:   No changes  *If you need a refill on your cardiac medications before your next appointment, please call your pharmacy*   Lab Work: Not needed    Testing/Procedures: Not needed   Follow-Up: At [facility], you and your health needs are our priority.  As part of our continuing mission to provide you with exceptional heart care, we have created designated Provider Care Teams.  These Care Teams include your primary Cardiologist (physician) and Advanced Practice Providers (APPs -  Physician Assistants and Nurse Practitioners) who all work together to provide you with the care you need, when you need it.  We recommend signing up for the patient portal called "MyChart".  Sign up information is provided on this After Visit Summary.  MyChart is used to connect with patients for Virtual Visits (Telemedicine).  Patients are able to view lab/test results, encounter notes, upcoming appointments, etc.  Non-urgent messages can be sent to your provider as well.   To learn more about what you can do with MyChart, go to [email or web address].    Your next appointment:   12 month(s)  The format for your next appointment:   In Person  Provider:   Bernadene Person, NP    Then, Bryan Lemma, MD will plan to see you again in 24 month(s).  Other Instructions  Congratulation  on quitting smoking

## 2023-07-26 NOTE — Progress Notes (Signed)
 Cardiology Office Note:  .   Date:  07/28/2023  ID:  Jennifer Waters, DOB 1961-05-15, MRN [id number] PCP: Norleen Lynwood ORN, MD  [facility] Providers Cardiologist:  Alm Clay, MD     Chief Complaint  Patient presents with   Follow-up    Annual follow-up   Hypertension    BP well-controlled.    Patient Profile: .     Jennifer Waters is an obese 63 y.o. female former smoker (quit 1 yr ago) with a PMH notable for HTN, HLD, along with ANA positive polyarthralgia and chronic migraines who presents here for annual follow-up at the request of Norleen Lynwood ORN, MD.    Jennifer Waters was last seen on July 21, 2022 by Damien Braver, NP: Doing well from a cardiac standpoint.  Had quit smoking 1 week prior to the visit.  Recently approved for disability.  She was helping to get a disability placard.  BP and lipids well-controlled LDL had been checked in March 2023 and was 67.  Congratulated her on the smoking cessation.  Subjective  Discussed the use of AI scribe software for clinical note transcription with the patient, who gave verbal consent to proceed.  History of Present Illness   Jennifer Waters is a 63 year old female with hypertension and type 2 diabetes mellitus who presents for a routine follow-up.  She has hypertension, managed with amlodipine  10 mg and losartan  HCTZ 50/12.5 mg. No dizziness, wooziness, or chest pain. Occasional leg swelling without pain in her calves or thighs during ambulation. She experienced dizziness once when she forgot her medication.  She has type 2 diabetes mellitus, currently on metformin  1000 mg. Her A1c has increased, necessitating an adjustment in her metformin  dosage. No foot ulcers or sores.  She is experiencing a current headache, which persists despite taking medication earlier. She is not taking Depakote, which was previously prescribed for her headaches.  She reports hip pain, attributing it to favoring her knee. She was advised  by a sports medicine doctor to perform exercises to strengthen the muscles around her hip and knee.  She quit smoking over a year ago and is motivated to maintain this for her health and her grandson's well-being.     Cardiovascular ROS: no chest pain or dyspnea on exertion positive for - headaches and dizziness.  A little bit of exercise intolerance as she is somewhat deconditioned -> not getting much exercise because of knee pain negative for - chest pain, dyspnea on exertion, edema, irregular heartbeat, orthopnea, palpitations, paroxysmal nocturnal dyspnea, rapid heart rate, shortness of breath, or syncope or near syncope, TIA/CVA or amaurosis fugax, claudication ; melena, hematochezia, hematuria or epistaxis.\     Objective   Medications: Amlodipine  10 mg daily, atorvastatin  10 mg daily, losartan -HCTZ 50-12.5 mg daily, metformin  XR 500 mg tabs-2 tabs daily; Prilosec 20 mg daily, Venlafaxine  150 mg daily Fluticasone  50 mcg/ACT nasal spray 1 spray per nostril daily. PRNs: Albuterol  108 (90 base) inhaler every 6 hours as needed;  Studies Reviewed: SABRA   EKG Interpretation Date/Time:  Tuesday July 26 2023 09:36:22 EST Ventricular Rate:  87 PR Interval:  136 QRS Duration:  64 QT Interval:  382 QTC Calculation: 459 R Axis:   72  Text Interpretation: Normal sinus rhythm Normal ECG When compared with ECG of 28-Jan-2008 23:59, Vent. rate has increased BY  30 BPM Confirmed by Clay Alm ([id number]) on 07/26/2023 9:47:45 AM    ECHO 2020: EF 55 to 60%.  Global  function normal.  Normal RV function.  No significant valvular abnormalities.  Labs from November 6,2024: (Noted in Epic) A1c 7.5 (up from 6.8); Cr 1.0, Na 139 K 3.9, Glu 147, normal LFTs; TC 112, TG 106, HDL 42, LDL 49  Risk Assessment/Calculations:         Physical Exam:   VS:  BP 106/74 (BP Location: Left Arm, Patient Position: Sitting, Cuff Size: Large)   Pulse 87   Ht 5' 9 (1.753 m)   Wt 236 lb 6.4 oz (107.2 kg)   SpO2  100%   BMI 34.91 kg/m    Wt Readings from Last 3 Encounters:  07/26/23 236 lb 6.4 oz (107.2 kg)  06/23/23 239 lb (108.4 kg)  06/18/23 239 lb (108.4 kg)    GEN: Well nourished, well groomed in no acute distress; obese NECK: No JVD; No carotid bruits CARDIAC: RRR; distant, but normal; S1, S2;  no murmurs, rubs, gallops RESPIRATORY:  Clear to auscultation without rales, wheezing or rhonchi ; nonlabored, good air movement. ABDOMEN: Soft, non-tender, non-distended EXTREMITIES:  No edema; No deformity    ASSESSMENT AND PLAN: .    Problem List Items Addressed This Visit       Cardiology Problems   Essential hypertension - Primary (Chronic)   Well controlled on Amlodipine  10mg  daily and Losartan /HCTZ 50/12.5mg  daily. No symptoms of dizziness or lightheadedness. -Continue current medications.      Relevant Orders   EKG 12-Lead (Completed)   Hyperlipidemia associated with type 2 diabetes mellitus (HCC) (Chronic)   Hyperlipidemia LDL 49 on Atorvastatin  10mg  daily. -Continue Atorvastatin  10mg  daily.  Type 2 Diabetes Mellitus Recent increase in A1C (up to 7.5) => Metformin  dose increased to 1000mg  daily by primary care provider. -Continue Metformin  1000mg  daily. -Defer to PCP        Other   Anxiety and depression   Managed with Effexor  ER 150mg  daily. -Continue Effexor  ER 150mg  daily.      Polyarthralgia (Chronic)   Managed with Flexeril  10mg  TID as needed, all doses taken at night. -Continue Flexeril  as prescribed.  No longer on gabapentin  or meloxicam .      Right hip pain (Chronic)   Recent onset, possibly related to favoring one knee. Patient plans to engage in strengthening exercises. -Encourage strengthening exercises and maintaining physical activity.      Tobacco abuse (Chronic)   Successful cessation for over a year. -Continue abstinence from smoking. Smoking cessation instruction/counseling given:  commended patient for quitting and reviewed strategies for  preventing relapses       Allergic Rhinitis/Asthma Managed with daily Flonase .Albuterol  used as needed. -Continue Flonase  daily. -Continue Albuterol  as needed.  GERD Managed with daily Prilosec 20mg . -Continue Prilosec 20mg  daily.   Follow-Up: Return in about 1 year (around 07/25/2024) for Follow-up with APP; then 1 Yr Follow-up, Routine follow up with me. Plan to see Damien Braver, NP in one year and return to this clinic in two years.      Signed, Alm MICAEL Clay, MD, MS Alm Clay, M.D., M.S. Interventional Cardiologist  [facility]  Pager # [phone] Phone # [phone] [address] [place], [place] [address]

## 2023-07-28 ENCOUNTER — Encounter: Payer: Self-pay | Admitting: Cardiology

## 2023-07-28 NOTE — Assessment & Plan Note (Signed)
 Recent onset, possibly related to favoring one knee. Patient plans to engage in strengthening exercises. -Encourage strengthening exercises and maintaining physical activity.

## 2023-07-28 NOTE — Assessment & Plan Note (Signed)
 Hyperlipidemia LDL 49 on Atorvastatin  10mg  daily. -Continue Atorvastatin  10mg  daily.  Type 2 Diabetes Mellitus Recent increase in A1C (up to 7.5) => Metformin  dose increased to 1000mg  daily by primary care provider. -Continue Metformin  1000mg  daily. -Defer to PCP

## 2023-07-28 NOTE — Progress Notes (Signed)
 Ben Caran Storck D.[facility] [address] [place] [address] Phone: [phone]   Assessment and Plan:     1. Neck pain on right side -Acute, uncomplicated, initial visit - Consistent with strain of right sided neck muscular, likely occurring during sleep - No red flag symptoms, so no imaging at today's visit - Start prednisone  Dosepak - Start Tylenol  500 to 1000 mg tablets 2-3 times a day for day-to-day pain relief - Patient has been using meloxicam  15 mg daily for several months.  We discussed that I do not recommend daily chronic NSAID use due to potential long-term side effects.  Recommend using meloxicam  15 mg daily as needed for breakthrough pain.  Recommend limiting chronic NSAIDs to 1-2 doses per week - Start HEP for neck  15 additional minutes spent for educating Therapeutic Home Exercise Program.  This included exercises focusing on stretching, strengthening, with focus on eccentric aspects.   Long term goals include an improvement in range of motion, strength, endurance as well as avoiding reinjury. Patient's frequency would include in 1-2 times a day, 3-5 times a week for a duration of 6-12 weeks. Proper technique shown and discussed handout in great detail with ATC.  All questions were discussed and answered.     Pertinent previous records reviewed include none pertinent  Follow Up: 2 weeks for reevaluation.  If any remaining pain, could further discuss OMT versus physical therapy versus trigger point injections   Subjective:   I, Claretha Schimke am a scribe for Dr. Leonce.   Chief Complaint: right shoulder pain  HPI:   04/28/2023 Patient is a 63 year old female with concerns of right shoulder pain. Patient states  that she has shoulder pain for about a month. She went to [facility] and was given a Toradol   injection and prednisone  , still has pain. Decreased ROM rates pain a 5/10.    Would like a CSI in her right knee as well has  increased her walking    05/26/2023 Patient states shoulder and knee are so much  better. Left knee and right hip pain flare but are resolving   07/29/2023 Patient states neck on the right side has been hurting for 2 weeks. Muscle relaxer and 500 mg medication  like tylenol  hasn't touched it. No tingling her numbness. Pain is sharp.   Relevant Historical Information: Hypertension, GERD, DM type II  Additional pertinent review of systems negative.   Current Outpatient Medications:    methylPREDNISolone  (MEDROL  DOSEPAK) 4 MG TBPK tablet, Take 6 tablets on day 1.  Take 5 tablets on day 2.  Take 4 tablets on day 3.  Take 3 tablets on day 4.  Take 2 tablets on day 5.  Take 1 tablet on day 6., Disp: 21 tablet, Rfl: 0   Acetaminophen  500 MG capsule, 1 capsule as needed Orally every 6 hrs, Disp: , Rfl:    albuterol  (VENTOLIN  HFA) 108 (90 Base) MCG/ACT inhaler, Inhale 2 puffs into the lungs every 6 (six) hours as needed for wheezing or shortness of breath. (Patient not taking: Reported on 07/26/2023), Disp: 6.7 g, Rfl: 2   amLODipine  (NORVASC ) 10 MG tablet, Take 1 tablet by mouth once daily, Disp: 90 tablet, Rfl: 3   atorvastatin  (LIPITOR) 10 MG tablet, Take 1 tablet by mouth once daily, Disp: 90 tablet, Rfl: 3   cetirizine  (ZYRTEC ) 10 MG tablet, 1 tablet, Disp: , Rfl:    cyclobenzaprine  (FLEXERIL ) 5 MG tablet, TAKE 1 TABLET(5 MG)  BY MOUTH THREE TIMES DAILY AS NEEDED FOR MUSCLE SPASMS, Disp: 60 tablet, Rfl: 0   fluticasone  (FLONASE ) 50 MCG/ACT nasal spray, 1 spray in each nostril, Disp: , Rfl:    losartan -hydrochlorothiazide  (HYZAAR) 50-12.5 MG tablet, Take 1 tablet by mouth once daily, Disp: 90 tablet, Rfl: 3   meloxicam  (MOBIC ) 15 MG tablet, Take 1 tablet (15 mg total) by mouth daily as needed for pain., Disp: 90 tablet, Rfl: 3   metFORMIN  (GLUCOPHAGE -XR) 500 MG 24 hr tablet, Take 2 tablets (1,000 mg total) by mouth daily with breakfast., Disp: 180 tablet, Rfl: 3   omeprazole  (PRILOSEC) 20 MG capsule,  TAKE 1 CAPSULE BY MOUTH EVERY DAY, Disp: 90 capsule, Rfl: 0   venlafaxine  XR (EFFEXOR -XR) 150 MG 24 hr capsule, TAKE 1 CAPSULE BY MOUTH ONCE DAILY WITH BREAKFAST, Disp: 90 capsule, Rfl: 2   Objective:     Vitals:   07/29/23 0912  BP: 110/70  Pulse: 74  SpO2: (!) 80%  Weight: 237 lb 6.4 oz (107.7 kg)  Height: 5' 9 (1.753 m)      Body mass index is 35.06 kg/m.    Physical Exam:    Neck Exam: Cervical Spine- Posture normal Skin- normal, intact  Neuro:  Strength-  Right Left   Deltoid (C5) 5/5 5/5  Bicep/Brachioradialis (C5/6) 5/5  5/5  Wrist Extension (C6) 5/5 5/5  Tricep (C7) 5/5 5/5  Wrist Flexion (C7) 5/5 5/5  Grip (C8) 5/5 5/5  Finger Abduction (T1) 5/5 5/5   Sensation: intact to light touch in upper extremities bilaterally  Spurling's:  negative bilaterally Neck ROM: Decreased right rotation and sidebending, otherwise full active ROM TTP: Right cervical paraspinal, right trapezius NTTP: cervical spinous processes, left cervical paraspinal, thoracic paraspinal, left trapezius    Electronically signed by:  Odis Mace D.[facility] 10:01 AM 07/29/23

## 2023-07-28 NOTE — Assessment & Plan Note (Signed)
 Managed with Flexeril  10mg  TID as needed, all doses taken at night. -Continue Flexeril  as prescribed.  No longer on gabapentin  or meloxicam .

## 2023-07-28 NOTE — Assessment & Plan Note (Signed)
 Successful cessation for over a year. -Continue abstinence from smoking. Smoking cessation instruction/counseling given:  commended patient for quitting and reviewed strategies for preventing relapses

## 2023-07-28 NOTE — Assessment & Plan Note (Signed)
 Managed with Effexor  ER 150mg  daily. -Continue Effexor  ER 150mg  daily.

## 2023-07-28 NOTE — Assessment & Plan Note (Signed)
 Well controlled on Amlodipine  10mg  daily and Losartan /HCTZ 50/12.5mg  daily. No symptoms of dizziness or lightheadedness. -Continue current medications.

## 2023-07-29 ENCOUNTER — Ambulatory Visit (INDEPENDENT_AMBULATORY_CARE_PROVIDER_SITE_OTHER): Payer: Medicare Other | Admitting: Sports Medicine

## 2023-07-29 VITALS — BP 110/70 | HR 74 | Ht 69.0 in | Wt 237.4 lb

## 2023-07-29 DIAGNOSIS — M542 Cervicalgia: Secondary | ICD-10-CM

## 2023-07-29 MED ORDER — METHYLPREDNISOLONE 4 MG PO TBPK: ORAL_TABLET | ORAL | 0 refills | Status: DC

## 2023-07-29 NOTE — Patient Instructions (Addendum)
 Prednisone  pack. Neck HEP. 2 week follow up. Tylenol 500 to 1000 mg. 2 to 3 times a day for pain relief.  Meloxicam  15 mg daily limit to 1 to 2 times a week.

## 2023-08-08 ENCOUNTER — Other Ambulatory Visit: Payer: Self-pay | Admitting: Internal Medicine

## 2023-08-08 MED ORDER — CYCLOBENZAPRINE HCL 5 MG PO TABS: 5.0000 mg | ORAL_TABLET | Freq: Three times a day (TID) | ORAL | 1 refills | Status: DC | PRN

## 2023-08-08 NOTE — Telephone Encounter (Signed)
Copied from CRM [id number]. Topic: Clinical - Medication Refill >> Aug 08, 2023  9:58 AM Lennart Pall wrote: Most Recent Primary Care Visit:  Provider: Wyvonne Lenz  Department: [facility]  Visit Type: MEDICARE AWV, INITIAL  Date: 06/23/2023  Medication: cyclobenzaprine   Has the patient contacted their pharmacy? Yes (Agent: If no, request that the patient contact the pharmacy for the refill. If patient does not wish to contact the pharmacy document the reason why and proceed with request.) (Agent: If yes, when and what did the pharmacy advise?)  Is this the correct pharmacy for this prescription? Yes If no, delete pharmacy and type the correct one.  This is the patient's preferred pharmacy:  [facility] [address], [place] - [address] AT [address] [place] [address] Phone: [phone] Fax: [phone]    Has the prescription been filled recently? Yes  Is the patient out of the medication? Yes  Has the patient been seen for an appointment in the last year OR does the patient have an upcoming appointment? Yes  Can we respond through MyChart? Yes  Agent: Please be advised that Rx refills may take up to 3 business days. We ask that you follow-up with your pharmacy.

## 2023-08-11 NOTE — Progress Notes (Unsigned)
Jennifer Waters D.[name] [address] [place] [address] Phone: [phone]   Assessment and Plan:     There are no diagnoses linked to this encounter.  ***   Pertinent previous records reviewed include ***    Follow Up: ***     Subjective:   I, Jennifer Waters, am serving as a Neurosurgeon for Doctor Richardean Sale  Chief Complaint: right shoulder pain   HPI:   04/28/2023 Patient is a 63 year old female with concerns of right shoulder pain. Patient states  that she has shoulder pain for about a month. She went to [facility] and was given a Toradol  injection and prednisone , still has pain. Decreased ROM rates pain a 5/10.    Would like a CSI in her right knee as well has increased her walking    05/26/2023 Patient states shoulder and knee are so much  better. Left knee and right hip pain flare but are resolving    07/29/2023 Patient states neck on the right side has been hurting for 2 weeks. Muscle relaxer and 500 mg medication  like tylenol hasn't touched it. No tingling her numbness. Pain is sharp.  08/11/2023 Patient states   Relevant Historical Information: Hypertension, GERD, DM type II    Additional pertinent review of systems negative.   Current Outpatient Medications:    Acetaminophen 500 MG capsule, 1 capsule as needed Orally every 6 hrs, Disp: , Rfl:    albuterol (VENTOLIN HFA) 108 (90 Base) MCG/ACT inhaler, Inhale 2 puffs into the lungs every 6 (six) hours as needed for wheezing or shortness of breath. (Patient not taking: Reported on 07/26/2023), Disp: 6.7 g, Rfl: 2   amLODipine (NORVASC) 10 MG tablet, Take 1 tablet by mouth once daily, Disp: 90 tablet, Rfl: 3   atorvastatin (LIPITOR) 10 MG tablet, Take 1 tablet by mouth once daily, Disp: 90 tablet, Rfl: 3   cetirizine (ZYRTEC) 10 MG tablet, 1 tablet, Disp: , Rfl:    cyclobenzaprine (FLEXERIL) 5 MG tablet, Take 1 tablet (5 mg total) by mouth 3 (three) times daily as needed for  muscle spasms., Disp: 60 tablet, Rfl: 1   fluticasone (FLONASE) 50 MCG/ACT nasal spray, 1 spray in each nostril, Disp: , Rfl:    losartan-hydrochlorothiazide (HYZAAR) 50-12.5 MG tablet, Take 1 tablet by mouth once daily, Disp: 90 tablet, Rfl: 3   meloxicam (MOBIC) 15 MG tablet, Take 1 tablet (15 mg total) by mouth daily as needed for pain., Disp: 90 tablet, Rfl: 3   metFORMIN (GLUCOPHAGE-XR) 500 MG 24 hr tablet, Take 2 tablets (1,000 mg total) by mouth daily with breakfast., Disp: 180 tablet, Rfl: 3   methylPREDNISolone (MEDROL DOSEPAK) 4 MG TBPK tablet, Take 6 tablets on day 1.  Take 5 tablets on day 2.  Take 4 tablets on day 3.  Take 3 tablets on day 4.  Take 2 tablets on day 5.  Take 1 tablet on day 6., Disp: 21 tablet, Rfl: 0   omeprazole (PRILOSEC) 20 MG capsule, TAKE 1 CAPSULE BY MOUTH EVERY DAY, Disp: 90 capsule, Rfl: 0   venlafaxine XR (EFFEXOR-XR) 150 MG 24 hr capsule, TAKE 1 CAPSULE BY MOUTH ONCE DAILY WITH BREAKFAST, Disp: 90 capsule, Rfl: 2   Objective:     There were no vitals filed for this visit.    There is no height or weight on file to calculate BMI.    Physical Exam:    ***   Electronically signed  by:  Jennifer Waters D.[name] 9:02 AM 08/11/23

## 2023-08-12 ENCOUNTER — Ambulatory Visit: Payer: Medicare Other | Admitting: Sports Medicine

## 2023-08-12 VITALS — BP 136/80 | Ht 69.0 in | Wt 236.0 lb

## 2023-08-12 DIAGNOSIS — M542 Cervicalgia: Secondary | ICD-10-CM | POA: Diagnosis not present

## 2023-08-12 NOTE — Patient Instructions (Signed)
PT referral  Tylenol [phone] mg 2-3 times a day for pain relief  HEP and heating pads  4-6 week follow up

## 2023-08-18 ENCOUNTER — Telehealth: Payer: Self-pay | Admitting: Cardiology

## 2023-08-18 MED ORDER — AMLODIPINE BESYLATE 10 MG PO TABS: 10.0000 mg | ORAL_TABLET | Freq: Every day | ORAL | 3 refills | Status: AC

## 2023-08-18 MED ORDER — LOSARTAN POTASSIUM-HCTZ 50-12.5 MG PO TABS: 1.0000 | ORAL_TABLET | Freq: Every day | ORAL | 3 refills | Status: AC

## 2023-08-18 MED ORDER — ATORVASTATIN CALCIUM 10 MG PO TABS: 10.0000 mg | ORAL_TABLET | Freq: Every day | ORAL | 3 refills | Status: AC

## 2023-08-18 NOTE — Telephone Encounter (Signed)
*  STAT* If patient is at the pharmacy, call can be transferred to refill team.   1. Which medications need to be refilled? (please list name of each medication and dose if known)  losartan-hydrochlorothiazide (HYZAAR) 50-12.5 MG tablet  atorvastatin (LIPITOR) 10 MG tablet  amLODipine (NORVASC) 10 MG tablet   4. Which pharmacy/location (including street and city if local pharmacy) is medication to be sent to?  Different pharmacy  [facility] [phone] - [place], [place] - [address] AT [facility] Phone: [phone]  Fax: [phone]       5. Do they need a 30 day or 90 day supply? 90

## 2023-09-16 ENCOUNTER — Other Ambulatory Visit: Payer: Self-pay | Admitting: Internal Medicine

## 2023-09-19 ENCOUNTER — Other Ambulatory Visit: Payer: Self-pay

## 2023-10-17 ENCOUNTER — Encounter: Payer: Self-pay | Admitting: Internal Medicine

## 2023-10-17 ENCOUNTER — Telehealth (INDEPENDENT_AMBULATORY_CARE_PROVIDER_SITE_OTHER): Admitting: Internal Medicine

## 2023-10-17 DIAGNOSIS — Z7984 Long term (current) use of oral hypoglycemic drugs: Secondary | ICD-10-CM

## 2023-10-17 DIAGNOSIS — R051 Acute cough: Secondary | ICD-10-CM

## 2023-10-17 DIAGNOSIS — E1165 Type 2 diabetes mellitus with hyperglycemia: Secondary | ICD-10-CM | POA: Diagnosis not present

## 2023-10-17 DIAGNOSIS — E559 Vitamin D deficiency, unspecified: Secondary | ICD-10-CM | POA: Diagnosis not present

## 2023-10-17 DIAGNOSIS — R062 Wheezing: Secondary | ICD-10-CM | POA: Diagnosis not present

## 2023-10-17 MED ORDER — HYDROCODONE BIT-HOMATROP MBR 5-1.5 MG/5ML PO SOLN
5.0000 mL | Freq: Four times a day (QID) | ORAL | 0 refills | Status: AC | PRN
Start: 2023-10-17 — End: 2023-10-27

## 2023-10-17 MED ORDER — CYCLOBENZAPRINE HCL 5 MG PO TABS: 5.0000 mg | ORAL_TABLET | Freq: Three times a day (TID) | ORAL | 2 refills | Status: DC | PRN

## 2023-10-17 MED ORDER — ALBUTEROL SULFATE HFA 108 (90 BASE) MCG/ACT IN AERS: 2.0000 | INHALATION_SPRAY | Freq: Four times a day (QID) | RESPIRATORY_TRACT | 2 refills | Status: AC | PRN

## 2023-10-17 MED ORDER — PREDNISONE 10 MG PO TABS: ORAL_TABLET | ORAL | 0 refills | Status: DC

## 2023-10-17 MED ORDER — DOXYCYCLINE HYCLATE 100 MG PO TABS: 100.0000 mg | ORAL_TABLET | Freq: Two times a day (BID) | ORAL | 0 refills | Status: DC

## 2023-10-17 NOTE — Assessment & Plan Note (Signed)
 Lab Results  Component Value Date   HGBA1C 7.5 (H) 04/26/2023   uncontrolled, pt to continue current medical treatment metformin  ER 500 mg - 2 every day, declines other change for now

## 2023-10-17 NOTE — Patient Instructions (Signed)
 Please take all new medication as prescribed  Please continue all other medications as before, and refills have been done if requested.  Please have the pharmacy call with any other refills you may need.  Please continue your efforts at being more active, low cholesterol diet, and weight control.  Please keep your appointments with your specialists as you may have planned  Please make an Appointment to return May 5, or sooner if needed

## 2023-10-17 NOTE — Assessment & Plan Note (Signed)
 Mild to mod, for prednisone  taper, and albuterol  hfa prn, to f/u any worsening symptoms or concerns

## 2023-10-17 NOTE — Assessment & Plan Note (Signed)
 Last vitamin D Lab Results  Component Value Date   VD25OH 38.95 04/26/2023   Low, to start oral replacement

## 2023-10-17 NOTE — Assessment & Plan Note (Signed)
 Mild to mod, c/w bronchitis vs pna, declines cxr, now for antibx course - doxycycline 100 bid, cough med prn, ,  to f/u any worsening symptoms or concerns

## 2023-10-17 NOTE — Progress Notes (Signed)
 Patient ID: Jennifer Waters, female   DOB: [birth year], 63 y.o.   MRN: [id number]  Virtual Visit via Video Note  I connected with Alfredo Inch on 10/17/23 at  4:00 PM EDT by a video enabled telemedicine application and verified that I am speaking with the correct person using two identifiers.  Location of all participants today Patient: at home Provider: at office   I discussed the limitations of evaluation and management by telemedicine and the availability of in person appointments. The patient expressed understanding and agreed to proceed.  History of Present Illness: Here with acute onset mild to mod 2-3 days ST, HA, general weakness and malaise, with prod cough greenish sputum, but Pt denies chest pain, orthopnea, PND, increased LE swelling, palpitations, dizziness or syncope but with mild sob wheezing doe in the past day  Daughter ill with similar, tx with albuterol , antix, prednisone  and doing better   Pt denies polydipsia, polyuria, or new focal neuro s/s.    Pt denies fever, wt loss, night sweats, loss of appetite, or other constitutional symptoms   Past Medical History:  Diagnosis Date   Arthritis    Depression    Hypertension    Migraines    Obesity    Past Surgical History:  Procedure Laterality Date   ABDOMINAL HYSTERECTOMY     BREAST MASS EXCISION     benign done as a child   COLONOSCOPY  01/2019   TRANSTHORACIC ECHOCARDIOGRAM  12/2018    EF 55-60%.  Impaired relaxation but indeterminate filling pressures.  Normal RV size and function.  Normal valves.  Normal aortic root.    reports that she quit smoking about 2 years ago. Her smoking use included cigarettes. She started smoking about 22 years ago. She has a 10 pack-year smoking history. She has never used smokeless tobacco. She reports that she does not currently use alcohol after a past usage of about 5.0 standard drinks of alcohol per week. She reports that she does not currently use drugs after having used the  following drugs: Marijuana. Frequency: 1.00 time per week. family history includes Arrhythmia in her sister; Arthritis in her mother and sister; Brain cancer in her brother; Diabetes in her brother; Edema in her sister; Lung cancer in her father; Migraines in her mother; Ovarian cancer in her mother. Allergies  Allergen Reactions   Chantix  [Varenicline ] Nausea Only   Current Outpatient Medications on File Prior to Visit  Medication Sig Dispense Refill   Acetaminophen 500 MG capsule 1 capsule as needed Orally every 6 hrs     amLODipine  (NORVASC ) 10 MG tablet Take 1 tablet (10 mg total) by mouth daily. 90 tablet 3   atorvastatin  (LIPITOR) 10 MG tablet Take 1 tablet (10 mg total) by mouth daily. 90 tablet 3   cetirizine  (ZYRTEC ) 10 MG tablet 1 tablet     fluticasone  (FLONASE ) 50 MCG/ACT nasal spray 1 spray in each nostril     losartan -hydrochlorothiazide (HYZAAR) 50-12.5 MG tablet Take 1 tablet by mouth daily. 90 tablet 3   meloxicam  (MOBIC ) 15 MG tablet Take 1 tablet (15 mg total) by mouth daily as needed for pain. 90 tablet 3   metFORMIN  (GLUCOPHAGE -XR) 500 MG 24 hr tablet Take 2 tablets (1,000 mg total) by mouth daily with breakfast. 180 tablet 3   methylPREDNISolone  (MEDROL  DOSEPAK) 4 MG TBPK tablet Take 6 tablets on day 1.  Take 5 tablets on day 2.  Take 4 tablets on day 3.  Take 3 tablets on day  4.  Take 2 tablets on day 5.  Take 1 tablet on day 6. 21 tablet 0   omeprazole  (PRILOSEC) 20 MG capsule TAKE 1 CAPSULE BY MOUTH EVERY DAY 90 capsule 0   venlafaxine  XR (EFFEXOR -XR) 150 MG 24 hr capsule TAKE 1 CAPSULE BY MOUTH ONCE DAILY WITH BREAKFAST 90 capsule 2   No current facility-administered medications on file prior to visit.   Observations/Objective: Alert, mild ill ,appropriate mood and affect, resps normal, cn 2-12 intact, moves all 4s, no visible rash or swelling Lab Results  Component Value Date   WBC 12.5 (H) 09/22/2022   HGB 14.3 09/22/2022   HCT 43.1 09/22/2022   PLT 282.0  09/22/2022   GLUCOSE 147 (H) 04/26/2023   CHOL 112 04/26/2023   TRIG 106.0 04/26/2023   HDL 42.30 04/26/2023   LDLCALC 49 04/26/2023   ALT 22 04/26/2023   AST 19 04/26/2023   NA 139 04/26/2023   K 3.9 04/26/2023   CL 103 04/26/2023   CREATININE 1.00 04/26/2023   BUN 18 04/26/2023   CO2 30 04/26/2023   TSH 0.89 09/22/2022   HGBA1C 7.5 (H) 04/26/2023   MICROALBUR <0.7 09/22/2022   Assessment and Plan: See notes  Follow Up Instructions: See notes   I discussed the assessment and treatment plan with the patient. The patient was provided an opportunity to ask questions and all were answered. The patient agreed with the plan and demonstrated an understanding of the instructions.   The patient was advised to call back or seek an in-person evaluation if the symptoms worsen or if the condition fails to improve as anticipated.   Rosalia Colonel, MD

## 2023-10-24 ENCOUNTER — Encounter: Payer: Self-pay | Admitting: Internal Medicine

## 2023-10-24 ENCOUNTER — Ambulatory Visit (INDEPENDENT_AMBULATORY_CARE_PROVIDER_SITE_OTHER): Payer: Medicare Other | Admitting: Internal Medicine

## 2023-10-24 ENCOUNTER — Ambulatory Visit (INDEPENDENT_AMBULATORY_CARE_PROVIDER_SITE_OTHER)

## 2023-10-24 VITALS — BP 124/78 | HR 40 | Temp 98.0°F | Ht 69.0 in | Wt 231.0 lb

## 2023-10-24 DIAGNOSIS — R1031 Right lower quadrant pain: Secondary | ICD-10-CM | POA: Diagnosis not present

## 2023-10-24 DIAGNOSIS — E559 Vitamin D deficiency, unspecified: Secondary | ICD-10-CM

## 2023-10-24 DIAGNOSIS — Z7984 Long term (current) use of oral hypoglycemic drugs: Secondary | ICD-10-CM

## 2023-10-24 DIAGNOSIS — Z0001 Encounter for general adult medical examination with abnormal findings: Secondary | ICD-10-CM

## 2023-10-24 DIAGNOSIS — R001 Bradycardia, unspecified: Secondary | ICD-10-CM | POA: Insufficient documentation

## 2023-10-24 DIAGNOSIS — E1165 Type 2 diabetes mellitus with hyperglycemia: Secondary | ICD-10-CM

## 2023-10-24 DIAGNOSIS — M1611 Unilateral primary osteoarthritis, right hip: Secondary | ICD-10-CM | POA: Diagnosis not present

## 2023-10-24 DIAGNOSIS — E538 Deficiency of other specified B group vitamins: Secondary | ICD-10-CM

## 2023-10-24 DIAGNOSIS — I10 Essential (primary) hypertension: Secondary | ICD-10-CM | POA: Diagnosis not present

## 2023-10-24 LAB — HEPATIC FUNCTION PANEL
ALT: 22 U/L (ref 0–35)
AST: 15 U/L (ref 0–37)
Albumin: 4.5 g/dL (ref 3.5–5.2)
Alkaline Phosphatase: 71 U/L (ref 39–117)
Bilirubin, Direct: 0.1 mg/dL (ref 0.0–0.3)
Total Bilirubin: 0.5 mg/dL (ref 0.2–1.2)
Total Protein: 6.8 g/dL (ref 6.0–8.3)

## 2023-10-24 LAB — URINALYSIS, ROUTINE W REFLEX MICROSCOPIC
Bilirubin Urine: NEGATIVE
Hgb urine dipstick: NEGATIVE
Ketones, ur: NEGATIVE
Leukocytes,Ua: NEGATIVE
Nitrite: NEGATIVE
Specific Gravity, Urine: 1.025 (ref 1.000–1.030)
Total Protein, Urine: NEGATIVE
Urine Glucose: NEGATIVE
Urobilinogen, UA: 0.2 (ref 0.0–1.0)
pH: 6 (ref 5.0–8.0)

## 2023-10-24 LAB — BASIC METABOLIC PANEL WITH GFR
BUN: 17 mg/dL (ref 6–23)
CO2: 30 meq/L (ref 19–32)
Calcium: 9.8 mg/dL (ref 8.4–10.5)
Chloride: 101 meq/L (ref 96–112)
Creatinine, Ser: 0.81 mg/dL (ref 0.40–1.20)
GFR: 78.07 mL/min (ref >60.00)
Glucose, Bld: 114 mg/dL — ABNORMAL HIGH (ref 70–99)
Potassium: 3.8 meq/L (ref 3.5–5.1)
Sodium: 138 meq/L (ref 135–145)

## 2023-10-24 LAB — LIPID PANEL
Cholesterol: 139 mg/dL (ref 0–200)
HDL: 59.3 mg/dL (ref >39.00)
LDL Cholesterol: 66 mg/dL (ref 0–99)
NonHDL: 80.1
Total CHOL/HDL Ratio: 2
Triglycerides: 71 mg/dL (ref 0.0–149.0)
VLDL: 14.2 mg/dL (ref 0.0–40.0)

## 2023-10-24 LAB — CBC WITH DIFFERENTIAL/PLATELET
Basophils Absolute: 0.1 10*3/uL (ref 0.0–0.1)
Basophils Relative: 0.7 % (ref 0.0–3.0)
Eosinophils Absolute: 0.1 10*3/uL (ref 0.0–0.7)
Eosinophils Relative: 0.5 % (ref 0.0–5.0)
HCT: 42.5 % (ref 36.0–46.0)
Hemoglobin: 14.1 g/dL (ref 12.0–15.0)
Lymphocytes Relative: 37.1 % (ref 12.0–46.0)
Lymphs Abs: 6.2 10*3/uL — ABNORMAL HIGH (ref 0.7–4.0)
MCHC: 33.2 g/dL (ref 30.0–36.0)
MCV: 91.2 fl (ref 78.0–100.0)
Monocytes Absolute: 1 10*3/uL (ref 0.1–1.0)
Monocytes Relative: 6 % (ref 3.0–12.0)
Neutro Abs: 9.3 10*3/uL — ABNORMAL HIGH (ref 1.4–7.7)
Neutrophils Relative %: 55.7 % (ref 43.0–77.0)
Platelets: 285 10*3/uL (ref 150.0–400.0)
RBC: 4.65 Mil/uL (ref 3.87–5.11)
RDW: 13.3 % (ref 11.5–15.5)
WBC: 16.8 10*3/uL — ABNORMAL HIGH (ref 4.0–10.5)

## 2023-10-24 LAB — VITAMIN D 25 HYDROXY (VIT D DEFICIENCY, FRACTURES): VITD: 39.2 ng/mL (ref 30.00–100.00)

## 2023-10-24 LAB — TSH: TSH: 1.24 u[IU]/mL (ref 0.35–5.50)

## 2023-10-24 LAB — VITAMIN B12: Vitamin B-12: 492 pg/mL (ref 211–911)

## 2023-10-24 LAB — MICROALBUMIN / CREATININE URINE RATIO
Creatinine,U: 142.5 mg/dL
Microalb Creat Ratio: 5.8 mg/g (ref 0.0–30.0)
Microalb, Ur: 0.8 mg/dL (ref 0.0–1.9)

## 2023-10-24 LAB — HEMOGLOBIN A1C: Hgb A1c MFr Bld: 7.1 % — ABNORMAL HIGH (ref 4.6–6.5)

## 2023-10-24 NOTE — Assessment & Plan Note (Signed)
 Last vitamin D  Lab Results  Component Value Date   VD25OH 39.20 10/24/2023   Low, to start oral replacement

## 2023-10-24 NOTE — Assessment & Plan Note (Signed)
 By oximetry with heavy polish, ECG reveiwed - NSR 68,  to f/u any worsening symptoms or concerns

## 2023-10-24 NOTE — Assessment & Plan Note (Signed)
 Lab Results  Component Value Date   HGBA1C 7.1 (H) 10/24/2023   uncontrolled, pt to continue current medical treatment metformin  ER 500 mg - 2 every day, declines change for now

## 2023-10-24 NOTE — Assessment & Plan Note (Signed)
 BP Readings from Last 3 Encounters:  10/24/23 124/78  08/12/23 136/80  07/29/23 110/70   Stable, pt to continue medical treatment norvasc  10 every day, hyzaar 50 12.5 qd

## 2023-10-24 NOTE — Assessment & Plan Note (Signed)
 No hernia noted, but pain with walking - ? Msk vs hip joint related - for right hip xray, consider f/u with sport medicine

## 2023-10-24 NOTE — Assessment & Plan Note (Signed)

## 2023-10-24 NOTE — Progress Notes (Signed)
 Patient ID: Jennifer Waters, female   DOB: 1960/10/21, 63 y.o.   MRN: [id number]         Chief Complaint:: wellness exam and dm, bradycardia, right groin pain, htn       HPI:  Jennifer Waters is a 63 y.o. female here for wellness exam; up to date                        Also lost 6 lbs with better diet.  Has increased stress with 2 siblings over 80 with health issues.  Has 84 yo grandson with possible autism.  Pt denies chest pain, increased sob or doe, wheezing, orthopnea, PND, increased LE swelling, palpitations, dizziness or syncope, though did have HR 40 by oximeter today with heavy nail polish.   Pt denies polydipsia, polyuria, or new focal neuro s/s, tolerating metformiin well.   Pt denies fever, wt loss, night sweats, loss of appetite, or other constitutional symptoms  Also has 2 wks onset right groin medial pain only with waking but sharp stabbing almost making her fall multiple times.     Wt Readings from Last 3 Encounters:  10/24/23 231 lb (104.8 kg)  08/12/23 236 lb (107 kg)  07/29/23 237 lb 6.4 oz (107.7 kg)   BP Readings from Last 3 Encounters:  10/24/23 124/78  08/12/23 136/80  07/29/23 110/70   Immunization History  Administered Date(s) Administered   Influenza, Seasonal, Injecte, Preservative Fre 04/26/2023   Influenza,inj,Quad PF,6+ Mos 05/01/2015, 05/04/2016, 02/25/2017, 02/17/2018, 03/07/2019, 02/28/2020, 02/17/2021, 03/19/2022   PFIZER(Purple Top)SARS-COV-2 Vaccination 01/16/2020, 02/13/2020   PNEUMOCOCCAL CONJUGATE-20 09/18/2021   Tdap 06/03/2017   Zoster Recombinant(Shingrix ) 11/21/2019, 02/28/2020   There are no preventive care reminders to display for this patient.     Past Medical History:  Diagnosis Date   Arthritis    Depression    Hypertension    Migraines    Obesity    Past Surgical History:  Procedure Laterality Date   ABDOMINAL HYSTERECTOMY     BREAST MASS EXCISION     benign done as a child   COLONOSCOPY  01/2019   TRANSTHORACIC  ECHOCARDIOGRAM  12/2018    EF 55-60%.  Impaired relaxation but indeterminate filling pressures.  Normal RV size and function.  Normal valves.  Normal aortic root.    reports that she quit smoking about 2 years ago. Her smoking use included cigarettes. She started smoking about 22 years ago. She has a 10 pack-year smoking history. She has never used smokeless tobacco. She reports that she does not currently use alcohol after a past usage of about 5.0 standard drinks of alcohol per week. She reports that she does not currently use drugs after having used the following drugs: Marijuana. Frequency: 1.00 time per week. family history includes Arrhythmia in her sister; Arthritis in her mother and sister; Brain cancer in her brother; Diabetes in her brother; Edema in her sister; Lung cancer in her father; Migraines in her mother; Ovarian cancer in her mother. Allergies  Allergen Reactions   Chantix  [Varenicline ] Nausea Only   Current Outpatient Medications on File Prior to Visit  Medication Sig Dispense Refill   Acetaminophen 500 MG capsule 1 capsule as needed Orally every 6 hrs     albuterol  (VENTOLIN  HFA) 108 (90 Base) MCG/ACT inhaler Inhale 2 puffs into the lungs every 6 (six) hours as needed for wheezing or shortness of breath. 8 g 2   amLODipine  (NORVASC ) 10 MG tablet Take 1 tablet (  10 mg total) by mouth daily. 90 tablet 3   atorvastatin  (LIPITOR) 10 MG tablet Take 1 tablet (10 mg total) by mouth daily. 90 tablet 3   cetirizine  (ZYRTEC ) 10 MG tablet 1 tablet     cyclobenzaprine  (FLEXERIL ) 5 MG tablet Take 1 tablet (5 mg total) by mouth 3 (three) times daily as needed for muscle spasms. 90 tablet 2   doxycycline  (VIBRA -TABS) 100 MG tablet Take 1 tablet (100 mg total) by mouth 2 (two) times daily. 20 tablet 0   fluticasone  (FLONASE ) 50 MCG/ACT nasal spray 1 spray in each nostril     HYDROcodone  bit-homatropine (HYCODAN) 5-1.5 MG/5ML syrup Take 5 mLs by mouth every 6 (six) hours as needed for up to 10  days. 180 mL 0   losartan -hydrochlorothiazide (HYZAAR) 50-12.5 MG tablet Take 1 tablet by mouth daily. 90 tablet 3   meloxicam  (MOBIC ) 15 MG tablet Take 1 tablet (15 mg total) by mouth daily as needed for pain. 90 tablet 3   metFORMIN  (GLUCOPHAGE -XR) 500 MG 24 hr tablet Take 2 tablets (1,000 mg total) by mouth daily with breakfast. 180 tablet 3   methylPREDNISolone  (MEDROL  DOSEPAK) 4 MG TBPK tablet Take 6 tablets on day 1.  Take 5 tablets on day 2.  Take 4 tablets on day 3.  Take 3 tablets on day 4.  Take 2 tablets on day 5.  Take 1 tablet on day 6. 21 tablet 0   omeprazole  (PRILOSEC) 20 MG capsule TAKE 1 CAPSULE BY MOUTH EVERY DAY 90 capsule 0   predniSONE  (DELTASONE ) 10 MG tablet 3 tabs by mouth per day for 3 days,2tabs per day for 3 days,1tab per day for 3 days 18 tablet 0   venlafaxine  XR (EFFEXOR -XR) 150 MG 24 hr capsule TAKE 1 CAPSULE BY MOUTH ONCE DAILY WITH BREAKFAST 90 capsule 2   No current facility-administered medications on file prior to visit.        ROS:  All others reviewed and negative.  Objective        PE:  BP 124/78 (BP Location: Left Arm, Patient Position: Sitting, Cuff Size: Normal)   Pulse (!) 40 Comment: has on long nails  Temp 98 F (36.7 C) (Oral)   Ht 5\' 9"  (1.753 m)   Wt 231 lb (104.8 kg)   SpO2 90%   BMI 34.11 kg/m                 Constitutional: Pt appears in NAD               HENT: Head: NCAT.                Right Ear: External ear normal.                 Left Ear: External ear normal.                Eyes: . Pupils are equal, round, and reactive to light. Conjunctivae and EOM are normal               Nose: without d/c or deformity               Neck: Neck supple. Gross normal ROM               Cardiovascular: Normal rate and regular rhythm.                 Pulmonary/Chest: Effort normal and breath sounds without rales or wheezing.  Abd:  Soft, NT, ND, + BS, no organomegaly               Neurological: Pt is alert. At baseline orientation,  motor grossly intact               Skin: Skin is warm. No rashes, no other new lesions, LE edema - none               Psychiatric: Pt behavior is normal without agitation   Micro: none  Cardiac tracings I have personally interpreted today:  ECG - nsr 68  Pertinent Radiological findings (summarize): none   Lab Results  Component Value Date   WBC 16.8 (H) 10/24/2023   HGB 14.1 10/24/2023   HCT 42.5 10/24/2023   PLT 285.0 10/24/2023   GLUCOSE 114 (H) 10/24/2023   CHOL 139 10/24/2023   TRIG 71.0 10/24/2023   HDL 59.30 10/24/2023   LDLCALC 66 10/24/2023   ALT 22 10/24/2023   AST 15 10/24/2023   NA 138 10/24/2023   K 3.8 10/24/2023   CL 101 10/24/2023   CREATININE 0.81 10/24/2023   BUN 17 10/24/2023   CO2 30 10/24/2023   TSH 1.24 10/24/2023   HGBA1C 7.1 (H) 10/24/2023   MICROALBUR 0.8 10/24/2023   Assessment/Plan:  Jennifer Waters is a 63 y.o. Black or African American [2] female with  has a past medical history of Arthritis, Depression, Hypertension, Migraines, and Obesity.  Encounter for well adult exam with abnormal findings Age and sex appropriate education and counseling updated with regular exercise and diet Referrals for preventative services - none needed Immunizations addressed - none needed Smoking counseling  - none needed Evidence for depression or other mood disorder - none significant Most recent labs reviewed. I have personally reviewed and have noted: 1) the patient's medical and social history 2) The patient's current medications and supplements 3) The patient's height, weight, and BMI have been recorded in the chart   Essential hypertension BP Readings from Last 3 Encounters:  10/24/23 124/78  08/12/23 136/80  07/29/23 110/70   Stable, pt to continue medical treatment norvasc  10 every day, hyzaar 50 12.5 qd   Vitamin D  deficiency Last vitamin D  Lab Results  Component Value Date   VD25OH 39.20 10/24/2023   Low, to start oral  replacement   Diabetes (HCC) Lab Results  Component Value Date   HGBA1C 7.1 (H) 10/24/2023   uncontrolled, pt to continue current medical treatment metformin  ER 500 mg - 2 every day, declines change for now    Bradycardia By oximetry with heavy polish, ECG reveiwed - NSR 68,  to f/u any worsening symptoms or concerns  Right inguinal pain No hernia noted, but pain with walking - ? Msk vs hip joint related - for right hip xray, consider f/u with sport medicine  Followup: Return in about 6 months (around 04/25/2024).  Rosalia Colonel, MD 10/24/2023 8:25 PM [facility] - [facility]

## 2023-10-24 NOTE — Patient Instructions (Signed)
 Your EKG was done today  Please continue all other medications as before, including the meloxicam  as needed for pain  Please have the pharmacy call with any other refills you may need.  Please continue your efforts at being more active, low cholesterol diet, and weight control.  You are otherwise up to date with prevention measures today.  Please keep your appointments with your specialists as you may have planned  Please go to the XRAY Department in the first floor for the x-ray testing  Please go to the LAB at the blood drawing area for the tests to be done  You will be contacted by phone if any changes need to be made immediately.  Otherwise, you will receive a letter about your results with an explanation, but please check with MyChart first.  Please make an Appointment to return in 6 months, or sooner if needed

## 2023-10-25 DIAGNOSIS — Z1231 Encounter for screening mammogram for malignant neoplasm of breast: Secondary | ICD-10-CM | POA: Diagnosis not present

## 2023-10-25 LAB — HM MAMMOGRAPHY

## 2023-10-26 ENCOUNTER — Telehealth: Payer: Self-pay | Admitting: Internal Medicine

## 2023-10-26 NOTE — Telephone Encounter (Signed)
 Copied from CRM [id number]. Topic: Clinical - Medication Question >> Oct 26, 2023  9:57 AM Bambi Bonine D wrote: Reason for CRM: Patient stated that she did a refill request for the venlafaxine  XR (EFFEXOR -XR) 150 MG 24 hr capsule at [facility] pharmacy and they stated that she would need a new prescription because she never got the medication sent there. Patient would like for Dr.John to write a new prescription for the medication and get it sent to the preferred [facility] pharmacy.

## 2023-10-27 ENCOUNTER — Other Ambulatory Visit: Payer: Self-pay

## 2023-10-27 MED ORDER — VENLAFAXINE HCL ER 150 MG PO CP24: 150.0000 mg | ORAL_CAPSULE | Freq: Every day | ORAL | 2 refills | Status: AC

## 2023-10-27 NOTE — Telephone Encounter (Signed)
Refill has been sent to [facility] 

## 2023-11-05 ENCOUNTER — Ambulatory Visit: Payer: Self-pay | Admitting: Internal Medicine

## 2024-01-30 ENCOUNTER — Other Ambulatory Visit: Payer: Self-pay | Admitting: Internal Medicine

## 2024-02-23 ENCOUNTER — Other Ambulatory Visit: Payer: Self-pay

## 2024-02-23 ENCOUNTER — Inpatient Hospital Stay (HOSPITAL_COMMUNITY)
Admission: EM | Admit: 2024-02-23 | Discharge: 2024-02-25 | DRG: 190 | Disposition: A | Discharge status: Home / Self Care | Attending: Internal Medicine | Admitting: Internal Medicine

## 2024-02-23 ENCOUNTER — Ambulatory Visit: Payer: Self-pay

## 2024-02-23 ENCOUNTER — Encounter (HOSPITAL_COMMUNITY): Payer: Self-pay | Admitting: Emergency Medicine

## 2024-02-23 ENCOUNTER — Emergency Department (HOSPITAL_COMMUNITY)

## 2024-02-23 ENCOUNTER — Ambulatory Visit: Admitting: Internal Medicine

## 2024-02-23 DIAGNOSIS — R0902 Hypoxemia: Secondary | ICD-10-CM

## 2024-02-23 DIAGNOSIS — Z8261 Family history of arthritis: Secondary | ICD-10-CM

## 2024-02-23 DIAGNOSIS — E119 Type 2 diabetes mellitus without complications: Secondary | ICD-10-CM

## 2024-02-23 DIAGNOSIS — F32A Depression, unspecified: Secondary | ICD-10-CM | POA: Diagnosis not present

## 2024-02-23 DIAGNOSIS — F419 Anxiety disorder, unspecified: Secondary | ICD-10-CM | POA: Diagnosis not present

## 2024-02-23 DIAGNOSIS — Z791 Long term (current) use of non-steroidal anti-inflammatories (NSAID): Secondary | ICD-10-CM | POA: Diagnosis not present

## 2024-02-23 DIAGNOSIS — F17210 Nicotine dependence, cigarettes, uncomplicated: Secondary | ICD-10-CM | POA: Diagnosis not present

## 2024-02-23 DIAGNOSIS — Z1152 Encounter for screening for COVID-19: Secondary | ICD-10-CM

## 2024-02-23 DIAGNOSIS — Z794 Long term (current) use of insulin: Secondary | ICD-10-CM | POA: Diagnosis not present

## 2024-02-23 DIAGNOSIS — J309 Allergic rhinitis, unspecified: Secondary | ICD-10-CM | POA: Diagnosis not present

## 2024-02-23 DIAGNOSIS — F17290 Nicotine dependence, other tobacco product, uncomplicated: Secondary | ICD-10-CM | POA: Diagnosis not present

## 2024-02-23 DIAGNOSIS — Z7984 Long term (current) use of oral hypoglycemic drugs: Secondary | ICD-10-CM

## 2024-02-23 DIAGNOSIS — I10 Essential (primary) hypertension: Secondary | ICD-10-CM | POA: Diagnosis present

## 2024-02-23 DIAGNOSIS — R069 Unspecified abnormalities of breathing: Secondary | ICD-10-CM | POA: Diagnosis not present

## 2024-02-23 DIAGNOSIS — J441 Chronic obstructive pulmonary disease with (acute) exacerbation: Principal | ICD-10-CM | POA: Diagnosis present

## 2024-02-23 DIAGNOSIS — Z833 Family history of diabetes mellitus: Secondary | ICD-10-CM | POA: Diagnosis not present

## 2024-02-23 DIAGNOSIS — R062 Wheezing: Secondary | ICD-10-CM | POA: Diagnosis not present

## 2024-02-23 DIAGNOSIS — E669 Obesity, unspecified: Secondary | ICD-10-CM | POA: Diagnosis present

## 2024-02-23 DIAGNOSIS — J40 Bronchitis, not specified as acute or chronic: Principal | ICD-10-CM

## 2024-02-23 DIAGNOSIS — Z23 Encounter for immunization: Secondary | ICD-10-CM | POA: Diagnosis not present

## 2024-02-23 DIAGNOSIS — E1169 Type 2 diabetes mellitus with other specified complication: Secondary | ICD-10-CM | POA: Diagnosis not present

## 2024-02-23 DIAGNOSIS — J9801 Acute bronchospasm: Secondary | ICD-10-CM | POA: Diagnosis not present

## 2024-02-23 DIAGNOSIS — D72829 Elevated white blood cell count, unspecified: Secondary | ICD-10-CM | POA: Diagnosis present

## 2024-02-23 DIAGNOSIS — Z888 Allergy status to other drugs, medicaments and biological substances status: Secondary | ICD-10-CM

## 2024-02-23 DIAGNOSIS — Z6833 Body mass index (BMI) 33.0-33.9, adult: Secondary | ICD-10-CM

## 2024-02-23 DIAGNOSIS — J9601 Acute respiratory failure with hypoxia: Secondary | ICD-10-CM | POA: Diagnosis present

## 2024-02-23 DIAGNOSIS — I700 Atherosclerosis of aorta: Secondary | ICD-10-CM | POA: Diagnosis not present

## 2024-02-23 DIAGNOSIS — J432 Centrilobular emphysema: Secondary | ICD-10-CM | POA: Diagnosis not present

## 2024-02-23 DIAGNOSIS — R590 Localized enlarged lymph nodes: Secondary | ICD-10-CM | POA: Diagnosis not present

## 2024-02-23 DIAGNOSIS — R0602 Shortness of breath: Secondary | ICD-10-CM | POA: Diagnosis not present

## 2024-02-23 DIAGNOSIS — R059 Cough, unspecified: Secondary | ICD-10-CM | POA: Diagnosis not present

## 2024-02-23 DIAGNOSIS — Z79899 Other long term (current) drug therapy: Secondary | ICD-10-CM | POA: Diagnosis not present

## 2024-02-23 DIAGNOSIS — E785 Hyperlipidemia, unspecified: Secondary | ICD-10-CM | POA: Diagnosis not present

## 2024-02-23 LAB — RESP PANEL BY RT-PCR (RSV, FLU A&B, COVID)  RVPGX2
Influenza A by PCR: NEGATIVE
Influenza B by PCR: NEGATIVE
Resp Syncytial Virus by PCR: NEGATIVE
SARS Coronavirus 2 by RT PCR: NEGATIVE

## 2024-02-23 LAB — CBC
HCT: 42.8 % (ref 36.0–46.0)
Hemoglobin: 13.6 g/dL (ref 12.0–15.0)
MCH: 28.9 pg (ref 26.0–34.0)
MCHC: 31.8 g/dL (ref 30.0–36.0)
MCV: 90.9 fL (ref 80.0–100.0)
Platelets: 259 K/uL (ref 150–400)
RBC: 4.71 MIL/uL (ref 3.87–5.11)
RDW: 12.6 % (ref 11.5–15.5)
WBC: 11.3 K/uL — ABNORMAL HIGH (ref 4.0–10.5)
nRBC: 0 % (ref 0.0–0.2)

## 2024-02-23 LAB — BASIC METABOLIC PANEL WITH GFR
Anion gap: 14 (ref 5–15)
BUN: 9 mg/dL (ref 8–23)
CO2: 23 mmol/L (ref 22–32)
Calcium: 10 mg/dL (ref 8.9–10.3)
Chloride: 101 mmol/L (ref 98–111)
Creatinine, Ser: 0.82 mg/dL (ref 0.44–1.00)
GFR, Estimated: >60 mL/min (ref >60)
Glucose, Bld: 178 mg/dL — ABNORMAL HIGH (ref 70–99)
Potassium: 3.5 mmol/L (ref 3.5–5.1)
Sodium: 138 mmol/L (ref 135–145)

## 2024-02-23 MED ORDER — PREDNISONE 20 MG PO TABS
40.0000 mg | ORAL_TABLET | Freq: Every day | ORAL | Status: DC
Start: 2024-02-25 — End: 2024-02-25
  Administered 2024-02-25: 40 mg via ORAL
  Filled 2024-02-23: qty 2

## 2024-02-23 MED ORDER — ALBUTEROL SULFATE (2.5 MG/3ML) 0.083% IN NEBU
10.0000 mg/h | INHALATION_SOLUTION | Freq: Once | RESPIRATORY_TRACT | Status: AC
  Administered 2024-02-23: 10 mg/h via RESPIRATORY_TRACT
  Filled 2024-02-23: qty 12

## 2024-02-23 MED ORDER — IPRATROPIUM-ALBUTEROL 0.5-2.5 (3) MG/3ML IN SOLN
3.0000 mL | Freq: Once | RESPIRATORY_TRACT | Status: AC
  Administered 2024-02-23: 3 mL via RESPIRATORY_TRACT
  Filled 2024-02-23: qty 3

## 2024-02-23 MED ORDER — ONDANSETRON HCL 4 MG/2ML IJ SOLN: 4.0000 mg | Freq: Four times a day (QID) | INTRAMUSCULAR | Status: DC | PRN

## 2024-02-23 MED ORDER — CYCLOBENZAPRINE HCL 5 MG PO TABS
5.0000 mg | ORAL_TABLET | Freq: Three times a day (TID) | ORAL | Status: DC | PRN
  Administered 2024-02-24: 5 mg via ORAL
  Filled 2024-02-23: qty 1

## 2024-02-23 MED ORDER — AMLODIPINE BESYLATE 5 MG PO TABS
10.0000 mg | ORAL_TABLET | Freq: Every day | ORAL | Status: DC
  Administered 2024-02-24 – 2024-02-25 (×2): 10 mg via ORAL
  Filled 2024-02-23 (×2): qty 2

## 2024-02-23 MED ORDER — NICOTINE POLACRILEX 2 MG MT GUM: 2.0000 mg | CHEWING_GUM | OROMUCOSAL | Status: DC | PRN

## 2024-02-23 MED ORDER — ALBUTEROL SULFATE HFA 108 (90 BASE) MCG/ACT IN AERS
4.0000 | INHALATION_SPRAY | Freq: Once | RESPIRATORY_TRACT | Status: AC
  Administered 2024-02-23: 4 via RESPIRATORY_TRACT
  Filled 2024-02-23: qty 6.7

## 2024-02-23 MED ORDER — SODIUM CHLORIDE 0.9 % IV SOLN
1.0000 g | INTRAVENOUS | Status: DC
  Administered 2024-02-24 (×2): 1 g via INTRAVENOUS
  Filled 2024-02-23 (×2): qty 10

## 2024-02-23 MED ORDER — SODIUM CHLORIDE 0.9% FLUSH
3.0000 mL | Freq: Two times a day (BID) | INTRAVENOUS | Status: DC
  Administered 2024-02-24 – 2024-02-25 (×4): 3 mL via INTRAVENOUS

## 2024-02-23 MED ORDER — IPRATROPIUM-ALBUTEROL 0.5-2.5 (3) MG/3ML IN SOLN
3.0000 mL | RESPIRATORY_TRACT | Status: DC | PRN
  Administered 2024-02-24: 3 mL via RESPIRATORY_TRACT
  Filled 2024-02-23: qty 3

## 2024-02-23 MED ORDER — ACETAMINOPHEN 325 MG PO TABS
650.0000 mg | ORAL_TABLET | Freq: Four times a day (QID) | ORAL | Status: DC | PRN
  Administered 2024-02-24 – 2024-02-25 (×2): 650 mg via ORAL
  Filled 2024-02-23 (×2): qty 2

## 2024-02-23 MED ORDER — VENLAFAXINE HCL ER 75 MG PO CP24
150.0000 mg | ORAL_CAPSULE | Freq: Every day | ORAL | Status: DC
  Administered 2024-02-24 – 2024-02-25 (×2): 150 mg via ORAL
  Filled 2024-02-23 (×2): qty 2

## 2024-02-23 MED ORDER — LOSARTAN POTASSIUM-HCTZ 50-12.5 MG PO TABS: 1.0000 | ORAL_TABLET | Freq: Every day | ORAL | Status: DC

## 2024-02-23 MED ORDER — INSULIN ASPART 100 UNIT/ML IJ SOLN
0.0000 [IU] | Freq: Three times a day (TID) | INTRAMUSCULAR | Status: DC
  Administered 2024-02-24: 1 [IU] via SUBCUTANEOUS
  Administered 2024-02-24: 2 [IU] via SUBCUTANEOUS
  Administered 2024-02-24: 1 [IU] via SUBCUTANEOUS

## 2024-02-23 MED ORDER — SENNOSIDES-DOCUSATE SODIUM 8.6-50 MG PO TABS: 1.0000 | ORAL_TABLET | Freq: Every evening | ORAL | Status: DC | PRN

## 2024-02-23 MED ORDER — ONDANSETRON HCL 4 MG PO TABS
4.0000 mg | ORAL_TABLET | Freq: Four times a day (QID) | ORAL | Status: DC | PRN
Start: 2024-02-23 — End: 2024-02-25

## 2024-02-23 MED ORDER — ATORVASTATIN CALCIUM 10 MG PO TABS
10.0000 mg | ORAL_TABLET | Freq: Every day | ORAL | Status: DC
  Administered 2024-02-24 – 2024-02-25 (×2): 10 mg via ORAL
  Filled 2024-02-23 (×2): qty 1

## 2024-02-23 MED ORDER — ACETAMINOPHEN 650 MG RE SUPP: 650.0000 mg | Freq: Four times a day (QID) | RECTAL | Status: DC | PRN

## 2024-02-23 MED ORDER — ENOXAPARIN SODIUM 40 MG/0.4ML IJ SOSY
40.0000 mg | PREFILLED_SYRINGE | INTRAMUSCULAR | Status: DC
  Administered 2024-02-24 – 2024-02-25 (×2): 40 mg via SUBCUTANEOUS
  Filled 2024-02-23 (×2): qty 0.4

## 2024-02-23 MED ORDER — IOHEXOL 350 MG/ML SOLN
75.0000 mL | Freq: Once | INTRAVENOUS | Status: AC | PRN
  Administered 2024-02-23: 75 mL via INTRAVENOUS

## 2024-02-23 MED ORDER — INSULIN ASPART 100 UNIT/ML IJ SOLN
0.0000 [IU] | Freq: Every day | INTRAMUSCULAR | Status: DC
  Administered 2024-02-24: 3 [IU] via SUBCUTANEOUS
  Administered 2024-02-24: 4 [IU] via SUBCUTANEOUS
  Filled 2024-02-23: qty 0.05

## 2024-02-23 MED ORDER — METHYLPREDNISOLONE SODIUM SUCC 125 MG IJ SOLR
125.0000 mg | Freq: Two times a day (BID) | INTRAMUSCULAR | Status: AC
  Administered 2024-02-24 (×2): 125 mg via INTRAVENOUS
  Filled 2024-02-23 (×2): qty 2

## 2024-02-23 NOTE — Telephone Encounter (Signed)
 Copied from CRM [id number]. Topic: Appointments - Appointment Cancel/Reschedule >> Feb 23, 2024  1:51 PM [name] wrote: Patient/patient representative is calling to cancel or reschedule an appointment. Refer to attachments for appointment information. patient daughter stated she had to call the EMS to take the patient to the hospital because she was not breathing well and unable to get out of her bed

## 2024-02-23 NOTE — ED Provider Notes (Signed)
 [place] EMERGENCY DEPARTMENT AT [facility] Provider Note   CSN: [id number] Arrival date & time: 02/23/24  1422     Patient presents with: Shortness of Breath   CORRINE TILLIS is a 63 y.o. female.   HPI     This is a 63 year old female who presents with cough and shortness of breath.  Patient reports that she began to feel more short of breath last night.  She is a current smoker.  No known history of asthma or COPD.  Has not had fevers or chills.  Reports ongoing nonproductive cough and shortness of breath.  Not normally on oxygen.  Per EMS she had wheezing and was given albuterol  and Atrovent with Solu-Medrol .  No leg swelling.  Denies chest pain.  Prior to Admission medications   Medication Sig Start Date End Date Taking? Authorizing Provider  Acetaminophen  500 MG capsule 1 capsule as needed Orally every 6 hrs    [provider]  albuterol  (VENTOLIN  HFA) 108 (90 Base) MCG/ACT inhaler Inhale 2 puffs into the lungs every 6 (six) hours as needed for wheezing or shortness of breath. 10/17/23   Norleen Lynwood ORN, MD  amLODipine  (NORVASC ) 10 MG tablet Take 1 tablet (10 mg total) by mouth daily. 08/18/23   Anner Alm ORN, MD  atorvastatin  (LIPITOR) 10 MG tablet Take 1 tablet (10 mg total) by mouth daily. 08/18/23   Anner Alm ORN, MD  cetirizine  (ZYRTEC ) 10 MG tablet 1 tablet    [provider]  cyclobenzaprine  (FLEXERIL ) 5 MG tablet TAKE 1 TABLET(5 MG) BY MOUTH THREE TIMES DAILY AS NEEDED FOR MUSCLE SPASMS 01/30/24   Norleen Lynwood ORN, MD  doxycycline  (VIBRA -TABS) 100 MG tablet Take 1 tablet (100 mg total) by mouth 2 (two) times daily. 10/17/23   Norleen Lynwood ORN, MD  fluticasone  (FLONASE ) 50 MCG/ACT nasal spray 1 spray in each nostril    [provider]  losartan -hydrochlorothiazide  (HYZAAR) 50-12.5 MG tablet Take 1 tablet by mouth daily. 08/18/23   Anner Alm ORN, MD  meloxicam  (MOBIC ) 15 MG tablet Take 1 tablet (15 mg total) by mouth daily as needed for  pain. 04/26/23   Norleen Lynwood ORN, MD  metFORMIN  (GLUCOPHAGE -XR) 500 MG 24 hr tablet Take 2 tablets (1,000 mg total) by mouth daily with breakfast. 04/26/23   Norleen Lynwood ORN, MD  methylPREDNISolone  (MEDROL  DOSEPAK) 4 MG TBPK tablet Take 6 tablets on day 1.  Take 5 tablets on day 2.  Take 4 tablets on day 3.  Take 3 tablets on day 4.  Take 2 tablets on day 5.  Take 1 tablet on day 6. 07/29/23   Leonce Katz, DO  omeprazole  (PRILOSEC) 20 MG capsule TAKE 1 CAPSULE BY MOUTH EVERY DAY 06/23/17   Dragnev, Romualdo Capers, NP  predniSONE  (DELTASONE ) 10 MG tablet 3 tabs by mouth per day for 3 days,2tabs per day for 3 days,1tab per day for 3 days 10/17/23   Norleen Lynwood ORN, MD  venlafaxine  XR (EFFEXOR -XR) 150 MG 24 hr capsule Take 1 capsule (150 mg total) by mouth daily with breakfast. 10/27/23   Norleen Lynwood ORN, MD    Allergies: Chantix  [varenicline ]    Review of Systems  Constitutional:  Positive for chills. Negative for fever.  Respiratory:  Positive for cough, shortness of breath and wheezing.   Cardiovascular:  Negative for chest pain.  All other systems reviewed and are negative.   Updated Vital Signs BP 136/76   Pulse (!) 107   Temp (!) 97.5  F (36.4 C) (Oral)   Resp (!) 25   SpO2 92%   Physical Exam Vitals and nursing note reviewed.  Constitutional:      Appearance: She is well-developed. She is obese. She is not ill-appearing.  HENT:     Head: Normocephalic and atraumatic.  Eyes:     Pupils: Pupils are equal, round, and reactive to light.  Cardiovascular:     Rate and Rhythm: Regular rhythm. Tachycardia present.     Heart sounds: Normal heart sounds.  Pulmonary:     Effort: Pulmonary effort is normal. No respiratory distress.     Breath sounds: Wheezing and rhonchi present.  Abdominal:     General: Bowel sounds are normal.     Palpations: Abdomen is soft.  Musculoskeletal:     Cervical back: Neck supple.  Skin:    General: Skin is warm and dry.  Neurological:     Mental Status:  She is alert and oriented to person, place, and time.  Psychiatric:        Mood and Affect: Mood normal.     (all labs ordered are listed, but only abnormal results are displayed) Labs Reviewed  BASIC METABOLIC PANEL WITH GFR - Abnormal; Notable for the following components:      Result Value   Glucose, Bld 178 (*)    All other components within normal limits  CBC - Abnormal; Notable for the following components:   WBC 11.3 (*)    All other components within normal limits  RESP PANEL BY RT-PCR (RSV, FLU A&B, COVID)  [id number]    EKG: EKG Interpretation Date/Time:  Thursday February 23 2024 14:45:13 EDT Ventricular Rate:  106 PR Interval:  143 QRS Duration:  59 QT Interval:  339 QTC Calculation: 451 R Axis:   85  Text Interpretation: Sinus tachycardia Consider right atrial enlargement Borderline right axis deviation Borderline T abnormalities, lateral leads Confirmed by Bari Pfeiffer ([id number]) on 02/23/2024 4:38:50 PM  Radiology: [facility] Chest 2 View Result Date: 02/23/2024 CLINICAL DATA:  Shortness of breath. EXAM: CHEST - 2 VIEW COMPARISON:  February 17, 2021. FINDINGS: The heart size and mediastinal contours are within normal limits. Both lungs are clear. The visualized skeletal structures are unremarkable. IMPRESSION: No active cardiopulmonary disease. Electronically Signed   By: Lynwood Landy Raddle M.D.   On: 02/23/2024 16:20     .Critical Care  Performed by: Bari Pfeiffer FALCON, MD Authorized by: Bari Pfeiffer FALCON, MD   Critical care provider statement:    Critical care time (minutes):  30   Critical care was necessary to treat or prevent imminent or life-threatening deterioration of the following conditions:  Respiratory failure   Critical care was time spent personally by me on the following activities:  Development of treatment plan with patient or surrogate, discussions with consultants, evaluation of patient's response to treatment, examination of patient, ordering and review of  laboratory studies, ordering and review of radiographic studies, ordering and performing treatments and interventions, pulse oximetry, re-evaluation of patient's condition and review of old charts    Medications Ordered in the ED  albuterol  (VENTOLIN  HFA) 108 (90 Base) MCG/ACT inhaler 4 puff (4 puffs Inhalation Given 02/23/24 1523)  ipratropium-albuterol  (DUONEB) 0.5-2.5 (3) MG/3ML nebulizer solution 3 mL (3 mLs Nebulization Given 02/23/24 1743)  ipratropium-albuterol  (DUONEB) 0.5-2.5 (3) MG/3ML nebulizer solution 3 mL (3 mLs Nebulization Given 02/23/24 1957)  iohexol  (OMNIPAQUE ) 350 MG/ML injection 75 mL (75 mLs Intravenous Contrast Given 02/23/24 2112)    Clinical Course as of  02/23/24 2129  Thu Feb 23, 2024  1738 Attempted ambulation.  Patient got very winded.  On my repeat evaluation she is now rhonchorous and wheezing again.  Repeat DuoNeb ordered.  She is tachycardic in the 140s which seems out of proportion.  Highly suspect bronchitis or bronchospasm; however will obtain a CT PE study as well. [CH]  1952 Significantly improved after DuoNeb.  Looks clinically much better.  Awaiting CT imaging.  Has some continued wheezing.  Will repeat neb.  Oxygen weaned off and satting 93% on room air. [CH]  2128 Patient just got off the bedside commode.  On recheck, she is tachypneic and now wheezing again.  O2 sats low 90s.  Will plan for admission.  CT reviewed and I do not see any evidence of obvious PE. [CH]    Clinical Course User Index [CH] Elim Economou, Charmaine FALCON, MD                                 Medical Decision Making Amount and/or Complexity of Data Reviewed Labs: ordered. Radiology: ordered.  Risk Prescription drug management. Decision regarding hospitalization.   This patient presents to the ED for concern of cough, shortness of breath, this involves an extensive number of treatment options, and is a complaint that carries with it a high risk of complications and morbidity.  I considered the  following differential and admission for this acute, potentially life threatening condition.  The differential diagnosis includes viral illness such as COVID or influenza, pneumonia, bronchitis, undiagnosed COPD or asthma, less likely PE ACS  MDM:    This is a 63 year old female who presents with shortness of breath and cough.  She is wheezing on exam and tachypneic.  Mildly tachycardic but did receive a DuoNeb.  She is a current smoker but does not have any diagnosis of asthma or COPD.  Patient was given Solu-Medrol  and a DuoNeb and route and reports significant improvement of symptoms.  I was able to wean off her oxygen.  Chest x-ray does not show any evidence of obvious pneumonia and COVID and flu testing are negative.  CBC notable for white count of 11.3.  No metabolic derangements.  Patient was initially given an inhaler because of her brisk response to a DuoNeb.  However, she got up to go to the bathroom and became abruptly tachypneic, tachycardic, and short of breath.  Second DuoNeb was ordered.  Because of out of proportion symptoms with ambulation, PE study was obtained.  Again, patient did respond well to a DuoNeb.  PE study obtained and does not show any obvious PE.  Awaiting formal read.  However, again patient got up with minimal exertion to the bedside commode and was again tachypneic and continued wheezing.  Suspect she may have undiagnosed reactive airway disease or COPD.  Likely with some acute bronchitis component.  Feel that she will need admission.  O2 sats borderline in the low 90s.  (Labs, imaging, consults)  Labs: I Ordered, and personally interpreted labs.  The pertinent results include: CBC, BMP, COVID, flu  Imaging Studies ordered: I ordered imaging studies including x-ray I independently visualized and interpreted imaging. I agree with the radiologist interpretation  Additional history obtained from chart review.  External records from outside source obtained and reviewed  including prior evaluations  Cardiac Monitoring: The patient was maintained on a cardiac monitor.  If on the cardiac monitor, I personally viewed and interpreted the cardiac  monitored which showed an underlying rhythm of: Sinus tachycardia  Reevaluation: After the interventions noted above, I reevaluated the patient and found that they have :stayed the same  Social Determinants of Health:  lives independently, smoker  Disposition: Admit  Co morbidities that complicate the patient evaluation  Past Medical History:  Diagnosis Date   Arthritis    Depression    Hypertension    Migraines    Obesity      Medicines Meds ordered this encounter  Medications   albuterol  (VENTOLIN  HFA) 108 (90 Base) MCG/ACT inhaler 4 puff   ipratropium-albuterol  (DUONEB) 0.5-2.5 (3) MG/3ML nebulizer solution 3 mL   ipratropium-albuterol  (DUONEB) 0.5-2.5 (3) MG/3ML nebulizer solution 3 mL   iohexol  (OMNIPAQUE ) 350 MG/ML injection 75 mL    I have reviewed the patients home medicines and have made adjustments as needed  Problem List / ED Course: Problem List Items Addressed This Visit   None Visit Diagnoses       Bronchitis    -  Primary     Hypoxia                    Final diagnoses:  Bronchitis  Hypoxia    ED Discharge Orders     None          Bari Charmaine FALCON, MD 02/23/24 2133

## 2024-02-23 NOTE — H&P (Signed)
 History and Physical    CADYNCE GARRETTE [name] DOB: Sep 26, 1960 DOA: 02/23/2024  PCP: Norleen Lynwood ORN, MD   Patient coming from: Home   Chief Complaint: DOE, wheezing, cough   HPI: LEANDA PADMORE is a 63 y.o. female with medical history significant for hypertension, hyperlipidemia, type 2 diabetes mellitus, depression, migraines, and current smoker who presents with shortness of breath, cough, and wheezing.  Patient developed a nonproductive cough yesterday and then exertional dyspnea and wheezing overnight.  She has had progressive dyspnea with ongoing cough and wheezing throughout the day today.  She is unable to go about her usual activities due to severe exertional dyspnea.  She has had mild rhinorrhea that she attributes to chronic allergic rhinitis but denies sore throat, fever, or chills.    Patient states that she has never been diagnosed with asthma or COPD.  She has been smoking close to 1 pack of cigarettes per day for roughly 40 years.  She would like to quit smoking, recently tried vaping, hoping that that would help her to quit, but she develops migraines when she vapes.  EMS was called and administered 10 mg albuterol , 0.5 mg Atrovent, and 125 mg IV Solu-Medrol  prior to arrival in the ED.  ED Course: Upon arrival to the ED, patient is found to be afebrile and saturating low 90s on room air with tachypnea, tachycardia, and elevated blood pressure.  Labs are most notable for normal creatinine, WBC 11,300, normal hemoglobin, and negative COVID, influenza, and RSV PCR.  CTA chest is negative for PE or other acute intrathoracic abnormality but notable for centrilobular emphysema.  Patient was treated in the ED with DuoNeb x 2 and 10 mg albuterol  treatment.  She reports significant improvement with this but continues to develop respiratory distress with minimal activity.  Review of Systems:  All other systems reviewed and apart from HPI, are negative.  Past Medical  History:  Diagnosis Date   Arthritis    Depression    Hypertension    Migraines    Non-insulin  dependent type 2 diabetes mellitus (HCC) 09/18/2021   Obesity     Past Surgical History:  Procedure Laterality Date   ABDOMINAL HYSTERECTOMY     BREAST MASS EXCISION     benign done as a child   COLONOSCOPY  01/2019   TRANSTHORACIC ECHOCARDIOGRAM  12/2018    EF 55-60%.  Impaired relaxation but indeterminate filling pressures.  Normal RV size and function.  Normal valves.  Normal aortic root.    Social History:   reports that she quit smoking about 2 years ago. Her smoking use included cigarettes. She started smoking about 22 years ago. She has a 10 pack-year smoking history. She has never used smokeless tobacco. She reports that she does not currently use alcohol after a past usage of about 5.0 standard drinks of alcohol per week. She reports that she does not currently use drugs after having used the following drugs: Marijuana. Frequency: 1.00 time per week.  Allergies  Allergen Reactions   Chantix  [Varenicline ] Nausea Only    Family History  Problem Relation Age of Onset   Arthritis Mother    Ovarian cancer Mother    Migraines Mother    Lung cancer Father    Arrhythmia Sister    Brain cancer Brother    Edema Sister    Arthritis Sister    Diabetes Brother    Colon polyps Neg Hx    Colon cancer Neg Hx    Esophageal cancer  Neg Hx    Rectal cancer Neg Hx    Stomach cancer Neg Hx      Prior to Admission medications   Medication Sig Start Date End Date Taking? Authorizing Provider  Acetaminophen  500 MG capsule 1 capsule as needed Orally every 6 hrs    [provider]  albuterol  (VENTOLIN  HFA) 108 (90 Base) MCG/ACT inhaler Inhale 2 puffs into the lungs every 6 (six) hours as needed for wheezing or shortness of breath. 10/17/23   Norleen Lynwood ORN, MD  amLODipine  (NORVASC ) 10 MG tablet Take 1 tablet (10 mg total) by mouth daily. 08/18/23   Anner Alm ORN, MD  atorvastatin   (LIPITOR) 10 MG tablet Take 1 tablet (10 mg total) by mouth daily. 08/18/23   Anner Alm ORN, MD  cetirizine  (ZYRTEC ) 10 MG tablet 1 tablet    [provider]  cyclobenzaprine  (FLEXERIL ) 5 MG tablet TAKE 1 TABLET(5 MG) BY MOUTH THREE TIMES DAILY AS NEEDED FOR MUSCLE SPASMS 01/30/24   Norleen Lynwood ORN, MD  fluticasone  (FLONASE ) 50 MCG/ACT nasal spray 1 spray in each nostril    [provider]  losartan -hydrochlorothiazide  (HYZAAR) 50-12.5 MG tablet Take 1 tablet by mouth daily. 08/18/23   Anner Alm ORN, MD  meloxicam  (MOBIC ) 15 MG tablet Take 1 tablet (15 mg total) by mouth daily as needed for pain. 04/26/23   Norleen Lynwood ORN, MD  metFORMIN  (GLUCOPHAGE -XR) 500 MG 24 hr tablet Take 2 tablets (1,000 mg total) by mouth daily with breakfast. 04/26/23   Norleen Lynwood ORN, MD  methylPREDNISolone  (MEDROL  DOSEPAK) 4 MG TBPK tablet Take 6 tablets on day 1.  Take 5 tablets on day 2.  Take 4 tablets on day 3.  Take 3 tablets on day 4.  Take 2 tablets on day 5.  Take 1 tablet on day 6. 07/29/23   Leonce Katz, DO  omeprazole  (PRILOSEC) 20 MG capsule TAKE 1 CAPSULE BY MOUTH EVERY DAY 06/23/17   Kathlyne Romualdo Capers, NP  venlafaxine  XR (EFFEXOR -XR) 150 MG 24 hr capsule Take 1 capsule (150 mg total) by mouth daily with breakfast. 10/27/23   Norleen Lynwood ORN, MD    Physical Exam: Vitals:   02/23/24 1830 02/23/24 1858 02/23/24 2135 02/23/24 2149  BP: 136/76  (!) 144/89   Pulse: (!) 110 (!) 107 (!) 111 (!) 110  Resp: 20 (!) 25 (!) 26 (!) 24  Temp:   (!) 96.5 F (35.8 C)   TempSrc:   Oral   SpO2: 95% 92% 90% 90%    Constitutional: NAD, calm  Eyes: PERTLA, lids and conjunctivae normal ENMT: Mucous membranes are moist. Posterior pharynx clear of any exudate or lesions.   Neck: supple, no masses  Respiratory: Decreased breath sounds bilaterally. Prolonged expiratory phase. Wheezing.  Cardiovascular: S1 & S2 heard, regular rate and rhythm. No extremity edema.   Abdomen: No tenderness, soft. Bowel  sounds active.  Musculoskeletal: no clubbing / cyanosis. No joint deformity upper and lower extremities.   Skin: no significant rashes, lesions, ulcers. Warm, dry, well-perfused. Neurologic: CN 2-12 grossly intact. Moving all extremities. Alert and oriented.  Psychiatric: Pleasant. Cooperative.    Labs and Imaging on Admission: I have personally reviewed following labs and imaging studies  CBC: Recent Labs  Lab 02/23/24 1528  WBC 11.3*  HGB 13.6  HCT 42.8  MCV 90.9  PLT 259   Basic Metabolic Panel: Recent Labs  Lab 02/23/24 1528  NA 138  K 3.5  CL 101  CO2 23  GLUCOSE 178*  BUN 9  CREATININE 0.82  CALCIUM  10.0   GFR: CrCl cannot be calculated (Unknown ideal weight.). Liver Function Tests: No results for input(s): AST, ALT, ALKPHOS, BILITOT, PROT, ALBUMIN in the last 168 hours. No results for input(s): LIPASE, AMYLASE in the last 168 hours. No results for input(s): AMMONIA in the last 168 hours. Coagulation Profile: No results for input(s): INR, PROTIME in the last 168 hours. Cardiac Enzymes: No results for input(s): CKTOTAL, CKMB, CKMBINDEX, TROPONINI in the last 168 hours. BNP (last 3 results) No results for input(s): PROBNP in the last 8760 hours. HbA1C: No results for input(s): HGBA1C in the last 72 hours. CBG: No results for input(s): GLUCAP in the last 168 hours. Lipid Profile: No results for input(s): CHOL, HDL, LDLCALC, TRIG, CHOLHDL, LDLDIRECT in the last 72 hours. Thyroid  Function Tests: No results for input(s): TSH, T4TOTAL, FREET4, T3FREE, THYROIDAB in the last 72 hours. Anemia Panel: No results for input(s): VITAMINB12, FOLATE, FERRITIN, TIBC, IRON, RETICCTPCT in the last 72 hours. Urine analysis:    Component Value Date/Time   COLORURINE YELLOW 10/24/2023 1038   APPEARANCEUR CLEAR 10/24/2023 1038   LABSPEC 1.025 10/24/2023 1038   PHURINE 6.0 10/24/2023 1038   GLUCOSEU  NEGATIVE 10/24/2023 1038   HGBUR NEGATIVE 10/24/2023 1038   BILIRUBINUR NEGATIVE 10/24/2023 1038   KETONESUR NEGATIVE 10/24/2023 1038   PROTEINUR NEGATIVE 02/28/2020 1241   UROBILINOGEN 0.2 10/24/2023 1038   NITRITE NEGATIVE 10/24/2023 1038   LEUKOCYTESUR NEGATIVE 10/24/2023 1038   Sepsis Labs: @LABRCNTIP (procalcitonin:4,lacticidven:4) ) Recent Results (from the past 240 hours)  Resp panel by RT-PCR (RSV, Flu A&B, Covid) Anterior Nasal Swab     Status: None   Collection Time: 02/23/24  3:28 PM   Specimen: Anterior Nasal Swab  Result Value Ref Range Status   SARS Coronavirus 2 by RT PCR NEGATIVE NEGATIVE Final    Comment: (NOTE) SARS-CoV-2 target nucleic acids are NOT DETECTED.  The SARS-CoV-2 RNA is generally detectable in upper respiratory specimens during the acute phase of infection. The lowest concentration of SARS-CoV-2 viral copies this assay can detect is 138 copies/mL. A negative result does not preclude SARS-Cov-2 infection and should not be used as the sole basis for treatment or other patient management decisions. A negative result may occur with  improper specimen collection/handling, submission of specimen other than nasopharyngeal swab, presence of viral mutation(s) within the areas targeted by this assay, and inadequate number of viral copies(<138 copies/mL). A negative result must be combined with clinical observations, patient history, and epidemiological information. The expected result is Negative.  Fact Sheet for Patients:  BloggerCourse.com  Fact Sheet for Healthcare Providers:  SeriousBroker.it  This test is no t yet approved or cleared by the [place]  FDA and  has been authorized for detection and/or diagnosis of SARS-CoV-2 by FDA under an Emergency Use Authorization (EUA). This EUA will remain  in effect (meaning this test can be used) for the duration of the COVID-19 declaration under Section  564(b)(1) of the Act, 21 U.S.C.section 360bbb-3(b)(1), unless the authorization is terminated  or revoked sooner.       Influenza A by PCR NEGATIVE NEGATIVE Final   Influenza B by PCR NEGATIVE NEGATIVE Final    Comment: (NOTE) The Xpert Xpress SARS-CoV-2/FLU/RSV plus assay is intended as an aid in the diagnosis of influenza from Nasopharyngeal swab specimens and should not be used as a sole basis for treatment. Nasal washings and aspirates are unacceptable for Xpert Xpress SARS-CoV-2/FLU/RSV testing.  Fact Sheet for Patients: BloggerCourse.com  Fact Sheet for Healthcare Providers: SeriousBroker.it  This test is not yet approved or cleared by the [place]  FDA and has been authorized for detection and/or diagnosis of SARS-CoV-2 by FDA under an Emergency Use Authorization (EUA). This EUA will remain in effect (meaning this test can be used) for the duration of the COVID-19 declaration under Section 564(b)(1) of the Act, 21 U.S.C. section 360bbb-3(b)(1), unless the authorization is terminated or revoked.     Resp Syncytial Virus by PCR NEGATIVE NEGATIVE Final    Comment: (NOTE) Fact Sheet for Patients: BloggerCourse.com  Fact Sheet for Healthcare Providers: SeriousBroker.it  This test is not yet approved or cleared by the [place]  FDA and has been authorized for detection and/or diagnosis of SARS-CoV-2 by FDA under an Emergency Use Authorization (EUA). This EUA will remain in effect (meaning this test can be used) for the duration of the COVID-19 declaration under Section 564(b)(1) of the Act, 21 U.S.C. section 360bbb-3(b)(1), unless the authorization is terminated or revoked.  Performed at [facility], [address], [place], [place] [address]      Radiological Exams on Admission: CT Angio Chest PE W and/or Wo Contrast Result Date:  02/23/2024 CLINICAL DATA:  Concern for pulmonary edema. EXAM: CT ANGIOGRAPHY CHEST WITH CONTRAST TECHNIQUE: Multidetector CT imaging of the chest was performed using the standard protocol during bolus administration of intravenous contrast. Multiplanar CT image reconstructions and MIPs were obtained to evaluate the vascular anatomy. RADIATION DOSE REDUCTION: This exam was performed according to the departmental dose-optimization program which includes automated exposure control, adjustment of the mA and/or kV according to patient size and/or use of iterative reconstruction technique. CONTRAST:  75mL OMNIPAQUE  IOHEXOL  350 MG/ML SOLN COMPARISON:  Chest radiograph dated 02/23/2024. FINDINGS: Cardiovascular: There is no cardiomegaly or pericardial effusion. Mild atherosclerotic calcification of the thoracic aorta. No aneurysmal dilatation or dissection. The origins of the great vessels of the aortic arch are patent. No pulmonary artery embolus identified. Mediastinum/Nodes: Mildly enlarged right hilar lymph node measures 12 mm. No mediastinal adenopathy. The esophagus is grossly unremarkable no mediastinal fluid collection. Lungs/Pleura: Background of centrilobular emphysema. No focal consolidation, pleural effusion, pneumothorax. The central airways are patent. Upper Abdomen: No acute abnormality. Musculoskeletal: No acute osseous pathology. Review of the MIP images confirms the above findings. IMPRESSION: 1. No acute intrathoracic pathology. No CT evidence of pulmonary artery embolus. 2. Aortic Atherosclerosis (ICD10-I70.0) and Emphysema (ICD10-J43.9). Electronically Signed   By: Vanetta Chou M.D.   On: 02/23/2024 21:27   DG Chest 2 View Result Date: 02/23/2024 CLINICAL DATA:  Shortness of breath. EXAM: CHEST - 2 VIEW COMPARISON:  February 17, 2021. FINDINGS: The heart size and mediastinal contours are within normal limits. Both lungs are clear. The visualized skeletal structures are unremarkable. IMPRESSION: No  active cardiopulmonary disease. Electronically Signed   By: Lynwood Landy Raddle M.D.   On: 02/23/2024 16:20    EKG: Independently reviewed. Sinus tachycardia, rate 106.   Assessment/Plan   1. COPD exacerbation   - Pt denies hx of COPD or asthma but has 40 pk-yr smoking hx, emphysematous changes on CT in ED, and exam suggestive of obstructive lung disease   - Culture sputum, start antibiotic, continue systemic steroid and short-acting bronchodilators, encourage smoking cessation     2. Hypertension  - Continue amlodipine , losartan , hydrochlorothiazide     3. Type II DM  - A1c was 7.1% in May 2025  - Check CBGs and use low-intensity SSI for now    4. HLD  -  Lipitor    5. Depression  - Effexor      DVT prophylaxis: Lovenox   Code Status: Full  Level of Care: Level of care: Progressive Family Communication: none present  Disposition Plan:  Patient is from: home  Anticipated d/c is to: home  Anticipated d/c date is: Possibly as early as 02/24/24  Patient currently: pending improved respiratory status Consults called: none  Admission status: Observation     Evalene GORMAN Sprinkles, MD [facility]  02/23/2024, 10:12 PM

## 2024-02-23 NOTE — ED Notes (Signed)
 Patient was ambulated to the bathroom. While walking to the bathroom, O 2 saturations were between 93-95. While in the bathroom the patient became very short of breath. She was taken back to her room and placed on a non re breather.

## 2024-02-23 NOTE — Telephone Encounter (Signed)
 FYI Only or Action Required?: FYI only for provider.  Patient was last seen in primary care on 10/24/2023 by Norleen Lynwood ORN, MD.  Called Nurse Triage reporting Wheezing.  Symptoms began yesterday.  Interventions attempted: Prescription medications: albuterol .  Symptoms are: unchanged.  Triage Disposition: Urgent Home Treatment With Follow-up Call  Patient/caregiver understands and will follow disposition?: Yes    Copied from CRM #[id number]. Topic: Clinical - Red Word Triage >> Feb 23, 2024  9:00 AM Berneda FALCON wrote: Red Word that prompted transfer to Nurse Triage: Patient began yesterday with a cough, but began wheezing 3X last night when she got up to go to the restroom. Feels like she is having trouble breathing. If she gets up, she cannot make it to the bathroom due to wheezing. Reason for Disposition  [1] Wheezing or coughing AND [2] hasn't used neb or inhaler (up to 3 treatments given 20 minutes apart) AND [3] it's available    Patient will call back if rescue treatments not effective.  Answer Assessment - Initial Assessment Questions Additional info:  Patient will take inhaler rescue dosing  puffs Q 20 Minutes X 3, if no relief will call back. Per request she is scheduled with pcp today.    1. RESPIRATORY STATUS: Describe your breathing? (e.g., wheezing, shortness of breath, unable to speak, severe coughing)      wheezing 2. ONSET: When did this asthma attack begin?      yesterday 3. TRIGGER: What do you think triggered this attack? (e.g., URI, exposure to pollen or other allergen, tobacco smoke)      Unsure 4. PEAK EXPIRATORY FLOW RATE (PEFR): Do you use a peak flow meter? If Yes, ask: What's the current peak flow? What's your personal best peak flow?       5. SEVERITY: How bad is this attack?      Worse overnight, Worse with ambulation. Speaking in full sentences on the call, no wheezing observed by this Clinical research associate.  6. ASTHMA MEDICINES:  What treatments have you  tried?      Used rescue inhaler with some relief 7. INHALED QUICK-RELIEF TREATMENTS FOR THIS ATTACK: What treatments have you given yourself so far? and How many and how often? If using an inhaler, ask, How many puffs? Note: Routine treatments are 2 puffs every 4 hours as needed. Rescue treatments are 4 puffs repeated every 20 minutes, up to three times as needed.      Used overnight with some relief. Has not tried rescue treatment at this time.  8. OTHER SYMPTOMS: Do you have any other symptoms? (e.g., chest pain, coughing up yellow sputum, fever, runny nose)     Cough, short of breath when ambulating, resolves with rest. Denies chest pain, fever, flu symptoms. Denies all other symptoms.  9. O2 SATURATION MONITOR:  Do you use an oxygen saturation monitor (pulse oximeter) at home? If Yes, What is your reading (oxygen level) today? What is your usual oxygen saturation reading? (e.g., 95%)  Protocols used: Asthma Attack-A-AH

## 2024-02-23 NOTE — ED Triage Notes (Signed)
 BIB EMS for SOB starting 1.5 hours ago during rest. Cough for last 1.5 days. Rhonchi/expiratory wheezing. 10 of albuterol  and 0.5 Atrovent 125 solumedrol 20g PIV RAC

## 2024-02-24 DIAGNOSIS — E1169 Type 2 diabetes mellitus with other specified complication: Secondary | ICD-10-CM | POA: Diagnosis present

## 2024-02-24 DIAGNOSIS — J441 Chronic obstructive pulmonary disease with (acute) exacerbation: Secondary | ICD-10-CM | POA: Diagnosis present

## 2024-02-24 DIAGNOSIS — Z79899 Other long term (current) drug therapy: Secondary | ICD-10-CM | POA: Diagnosis not present

## 2024-02-24 DIAGNOSIS — Z8261 Family history of arthritis: Secondary | ICD-10-CM | POA: Diagnosis not present

## 2024-02-24 DIAGNOSIS — Z6833 Body mass index (BMI) 33.0-33.9, adult: Secondary | ICD-10-CM | POA: Diagnosis not present

## 2024-02-24 DIAGNOSIS — J40 Bronchitis, not specified as acute or chronic: Secondary | ICD-10-CM | POA: Diagnosis present

## 2024-02-24 DIAGNOSIS — Z833 Family history of diabetes mellitus: Secondary | ICD-10-CM | POA: Diagnosis not present

## 2024-02-24 DIAGNOSIS — F17210 Nicotine dependence, cigarettes, uncomplicated: Secondary | ICD-10-CM | POA: Diagnosis present

## 2024-02-24 DIAGNOSIS — J9601 Acute respiratory failure with hypoxia: Secondary | ICD-10-CM | POA: Diagnosis present

## 2024-02-24 DIAGNOSIS — F32A Depression, unspecified: Secondary | ICD-10-CM | POA: Diagnosis present

## 2024-02-24 DIAGNOSIS — Z23 Encounter for immunization: Secondary | ICD-10-CM | POA: Diagnosis not present

## 2024-02-24 DIAGNOSIS — Z888 Allergy status to other drugs, medicaments and biological substances status: Secondary | ICD-10-CM | POA: Diagnosis not present

## 2024-02-24 DIAGNOSIS — Z794 Long term (current) use of insulin: Secondary | ICD-10-CM | POA: Diagnosis not present

## 2024-02-24 DIAGNOSIS — J309 Allergic rhinitis, unspecified: Secondary | ICD-10-CM | POA: Diagnosis present

## 2024-02-24 DIAGNOSIS — I10 Essential (primary) hypertension: Secondary | ICD-10-CM | POA: Diagnosis present

## 2024-02-24 DIAGNOSIS — E669 Obesity, unspecified: Secondary | ICD-10-CM | POA: Diagnosis present

## 2024-02-24 DIAGNOSIS — F419 Anxiety disorder, unspecified: Secondary | ICD-10-CM | POA: Diagnosis present

## 2024-02-24 DIAGNOSIS — J432 Centrilobular emphysema: Secondary | ICD-10-CM | POA: Diagnosis present

## 2024-02-24 DIAGNOSIS — Z791 Long term (current) use of non-steroidal anti-inflammatories (NSAID): Secondary | ICD-10-CM | POA: Diagnosis not present

## 2024-02-24 DIAGNOSIS — Z7984 Long term (current) use of oral hypoglycemic drugs: Secondary | ICD-10-CM | POA: Diagnosis not present

## 2024-02-24 DIAGNOSIS — Z1152 Encounter for screening for COVID-19: Secondary | ICD-10-CM | POA: Diagnosis not present

## 2024-02-24 DIAGNOSIS — D72829 Elevated white blood cell count, unspecified: Secondary | ICD-10-CM | POA: Diagnosis present

## 2024-02-24 DIAGNOSIS — J9801 Acute bronchospasm: Secondary | ICD-10-CM | POA: Diagnosis not present

## 2024-02-24 DIAGNOSIS — E785 Hyperlipidemia, unspecified: Secondary | ICD-10-CM | POA: Diagnosis present

## 2024-02-24 DIAGNOSIS — F17290 Nicotine dependence, other tobacco product, uncomplicated: Secondary | ICD-10-CM | POA: Diagnosis present

## 2024-02-24 LAB — BASIC METABOLIC PANEL WITH GFR
Anion gap: 20 — ABNORMAL HIGH (ref 5–15)
BUN: 14 mg/dL (ref 8–23)
CO2: 19 mmol/L — ABNORMAL LOW (ref 22–32)
Calcium: 10.6 mg/dL — ABNORMAL HIGH (ref 8.9–10.3)
Chloride: 100 mmol/L (ref 98–111)
Creatinine, Ser: 0.93 mg/dL (ref 0.44–1.00)
GFR, Estimated: >60 mL/min (ref >60)
Glucose, Bld: 217 mg/dL — ABNORMAL HIGH (ref 70–99)
Potassium: 3.8 mmol/L (ref 3.5–5.1)
Sodium: 139 mmol/L (ref 135–145)

## 2024-02-24 LAB — CBC
HCT: 43.7 % (ref 36.0–46.0)
Hemoglobin: 14.4 g/dL (ref 12.0–15.0)
MCH: 29.8 pg (ref 26.0–34.0)
MCHC: 33 g/dL (ref 30.0–36.0)
MCV: 90.5 fL (ref 80.0–100.0)
Platelets: 318 K/uL (ref 150–400)
RBC: 4.83 MIL/uL (ref 3.87–5.11)
RDW: 12.7 % (ref 11.5–15.5)
WBC: 18.5 K/uL — ABNORMAL HIGH (ref 4.0–10.5)
nRBC: 0 % (ref 0.0–0.2)

## 2024-02-24 LAB — GLUCOSE, CAPILLARY
Glucose-Capillary: 173 mg/dL — ABNORMAL HIGH (ref 70–99)
Glucose-Capillary: 184 mg/dL — ABNORMAL HIGH (ref 70–99)
Glucose-Capillary: 225 mg/dL — ABNORMAL HIGH (ref 70–99)
Glucose-Capillary: 257 mg/dL — ABNORMAL HIGH (ref 70–99)
Glucose-Capillary: 319 mg/dL — ABNORMAL HIGH (ref 70–99)

## 2024-02-24 LAB — HIV ANTIBODY (ROUTINE TESTING W REFLEX): HIV Screen 4th Generation wRfx: NONREACTIVE

## 2024-02-24 MED ORDER — INFLUENZA VIRUS VACC SPLIT PF (FLUZONE) 0.5 ML IM SUSY
0.5000 mL | PREFILLED_SYRINGE | INTRAMUSCULAR | Status: AC
  Administered 2024-02-25: 0.5 mL via INTRAMUSCULAR
  Filled 2024-02-24: qty 0.5

## 2024-02-24 MED ORDER — LOSARTAN POTASSIUM 50 MG PO TABS
50.0000 mg | ORAL_TABLET | Freq: Every day | ORAL | Status: DC
  Administered 2024-02-24 – 2024-02-25 (×2): 50 mg via ORAL
  Filled 2024-02-24 (×3): qty 1

## 2024-02-24 MED ORDER — HYDROCHLOROTHIAZIDE 12.5 MG PO TABS
12.5000 mg | ORAL_TABLET | Freq: Every day | ORAL | Status: DC
  Administered 2024-02-24 – 2024-02-25 (×2): 12.5 mg via ORAL
  Filled 2024-02-24 (×2): qty 1

## 2024-02-24 NOTE — Progress Notes (Signed)
 PROGRESS NOTE  Jennifer Waters  [name] DOB: 1960/12/10 DOA: 02/23/2024 PCP: Norleen Lynwood ORN, MD   Brief Narrative: Patient is a 63 year old female with history of hypertension, hyperlipidemia, type 2 diabetes, depression, migraines, currently smoker who presents with shortness of breath, cough, wheezing from home.  She had progressive dyspnea.  She has never been diagnosed with asthma or COPD.  Has been smoking a pack of cigarettes a day for last 40 years.  On presentation, she was tachycardic, saturating low 90s on room air, tachypneic, hypotensive.  Labs showed WC count of 11.3.  COVID/flu/rsv  was negative.CTA chest was negative for PE or acute intrathoracic abnormality but showed centrilobular emphysema.  Patient was admitted for the management of COPD exacerbation.  Assessment & Plan:  Principal Problem:   Acute bronchospasm Active Problems:   Hyperlipidemia associated with type 2 diabetes mellitus (HCC)   Essential hypertension   Anxiety and depression   Non-insulin  dependent type 2 diabetes mellitus (HCC)  Acute COPD exacerbation: No history of COPD or asthma.  Lifelong smoker. Chest imaging did not show any pneumonia but shows emphysematous changes suggesting obstructive lung disease. Started on steroids, bronchodilators.  She has also been started on ceftriaxone .  Continue current management  Acute hypoxic respiratory failure: Currently on 2 L of oxygen per minute.  Not on oxygen at home.  Hopefully we can wean her to room air.  Tobacco abuse:  Has been smoking a pack of cigarettes a day for last 40 years.  Counseled for cessation.  Started on nicotine  patch  Leukocytosis: Progressively worsened after hospitalization.  This is most likely from steroids.  Continue to monitor.  Low suspicion for infectious process  Hypertension: On amlodipine , losartan , hydrochlorothiazide .  Continue same.  Type 2 diabetes: Last A1c was 7.1 as per [id number].  Continue current insulin   regimen.  Monitor blood sugars  Hyperlipidemia: Continue Lipitor  Depression: Continue Effexor   Obesity: BMI of 33.9         DVT prophylaxis:enoxaparin  (LOVENOX ) injection 40 mg Start: 02/24/24 1000     Code Status: Full Code  Family Communication:None at bedside   Patient status:Obs  Patient is from :Home  Anticipated discharge un:Ynfz  Estimated DC date:1-2 days   Consultants: None  Procedures:None  Antimicrobials:  Anti-infectives (From admission, onward)    Start     Dose/Rate Route Frequency Ordered Stop   02/23/24 2200  cefTRIAXone  (ROCEPHIN ) 1 g in sodium chloride  0.9 % 100 mL IVPB        1 g 200 mL/hr over 30 Minutes Intravenous Every 24 hours 02/23/24 2153 02/28/24 2159       Subjective:  Patient seen and examined at bedside today.  Overall comfortable.  Lying on bed.  Was on 1 L of oxygen during my evaluation.  I weaned her to room air.  She denies any worsening shortness of breath or cough.  She says she feels much better today.  She says she feels short of breath with ambulation.  Objective: Vitals:   02/23/24 2149 02/23/24 2321 02/24/24 0008 02/24/24 0420  BP:   (!) 148/76 138/88  Pulse: (!) 110 (!) 110 75 (!) 108  Resp: (!) 24 (!) 22 18 18   Temp:   97.7 F (36.5 C) 97.8 F (36.6 C)  TempSrc:   Oral Oral  SpO2: 90% 92%  100%  Weight:   104.2 kg   Height:   5' 9 (1.753 m)     Intake/Output Summary (Last 24 hours) at 02/24/2024 0743 Last  data filed at 02/24/2024 0324 Gross per 24 hour  Intake 100 ml  Output --  Net 100 ml   Filed Weights   02/24/24 0008  Weight: 104.2 kg    Examination:  General exam: Overall comfortable, not in distress,obese HEENT: PERRL Respiratory system: Diminished air sounds bilaterally, mild expiratory bilateral wheezing Cardiovascular system: S1 & S2 heard, RRR.  Gastrointestinal system: Abdomen is nondistended, soft and nontender. Central nervous system: Alert and oriented Extremities: No edema, no  clubbing ,no cyanosis Skin: No rashes, no ulcers,no icterus     Data Reviewed: I have personally reviewed following labs and imaging studies  CBC: Recent Labs  Lab 02/23/24 1528 02/24/24 0428  WBC 11.3* 18.5*  HGB 13.6 14.4  HCT 42.8 43.7  MCV 90.9 90.5  PLT 259 318   Basic Metabolic Panel: Recent Labs  Lab 02/23/24 1528 02/24/24 0428  NA 138 139  K 3.5 3.8  CL 101 100  CO2 23 19*  GLUCOSE 178* 217*  BUN 9 14  CREATININE 0.82 0.93  CALCIUM  10.0 10.6*     Recent Results (from the past 240 hours)  Resp panel by RT-PCR (RSV, Flu A&B, Covid) Anterior Nasal Swab     Status: None   Collection Time: 02/23/24  3:28 PM   Specimen: Anterior Nasal Swab  Result Value Ref Range Status   SARS Coronavirus 2 by RT PCR NEGATIVE NEGATIVE Final    Comment: (NOTE) SARS-CoV-2 target nucleic acids are NOT DETECTED.  The SARS-CoV-2 RNA is generally detectable in upper respiratory specimens during the acute phase of infection. The lowest concentration of SARS-CoV-2 viral copies this assay can detect is 138 copies/mL. A negative result does not preclude SARS-Cov-2 infection and should not be used as the sole basis for treatment or other patient management decisions. A negative result may occur with  improper specimen collection/handling, submission of specimen other than nasopharyngeal swab, presence of viral mutation(s) within the areas targeted by this assay, and inadequate number of viral copies(<138 copies/mL). A negative result must be combined with clinical observations, patient history, and epidemiological information. The expected result is Negative.  Fact Sheet for Patients:  BloggerCourse.com  Fact Sheet for Healthcare Providers:  SeriousBroker.it  This test is no t yet approved or cleared by the [place]  FDA and  has been authorized for detection and/or diagnosis of SARS-CoV-2 by FDA under an Emergency Use  Authorization (EUA). This EUA will remain  in effect (meaning this test can be used) for the duration of the COVID-19 declaration under Section 564(b)(1) of the Act, 21 U.S.C.section 360bbb-3(b)(1), unless the authorization is terminated  or revoked sooner.       Influenza A by PCR NEGATIVE NEGATIVE Final   Influenza B by PCR NEGATIVE NEGATIVE Final    Comment: (NOTE) The Xpert Xpress SARS-CoV-2/FLU/RSV plus assay is intended as an aid in the diagnosis of influenza from Nasopharyngeal swab specimens and should not be used as a sole basis for treatment. Nasal washings and aspirates are unacceptable for Xpert Xpress SARS-CoV-2/FLU/RSV testing.  Fact Sheet for Patients: BloggerCourse.com  Fact Sheet for Healthcare Providers: SeriousBroker.it  This test is not yet approved or cleared by the [place]  FDA and has been authorized for detection and/or diagnosis of SARS-CoV-2 by FDA under an Emergency Use Authorization (EUA). This EUA will remain in effect (meaning this test can be used) for the duration of the COVID-19 declaration under Section 564(b)(1) of the Act, 21 U.S.C. section 360bbb-3(b)(1), unless the authorization is terminated or  revoked.     Resp Syncytial Virus by PCR NEGATIVE NEGATIVE Final    Comment: (NOTE) Fact Sheet for Patients: BloggerCourse.com  Fact Sheet for Healthcare Providers: SeriousBroker.it  This test is not yet approved or cleared by the [place]  FDA and has been authorized for detection and/or diagnosis of SARS-CoV-2 by FDA under an Emergency Use Authorization (EUA). This EUA will remain in effect (meaning this test can be used) for the duration of the COVID-19 declaration under Section 564(b)(1) of the Act, 21 U.S.C. section 360bbb-3(b)(1), unless the authorization is terminated or revoked.  Performed at [facility],  [address], [place], [place] [address]      Radiology Studies: CT Angio Chest PE W and/or Wo Contrast Result Date: 02/23/2024 CLINICAL DATA:  Concern for pulmonary edema. EXAM: CT ANGIOGRAPHY CHEST WITH CONTRAST TECHNIQUE: Multidetector CT imaging of the chest was performed using the standard protocol during bolus administration of intravenous contrast. Multiplanar CT image reconstructions and MIPs were obtained to evaluate the vascular anatomy. RADIATION DOSE REDUCTION: This exam was performed according to the departmental dose-optimization program which includes automated exposure control, adjustment of the mA and/or kV according to patient size and/or use of iterative reconstruction technique. CONTRAST:  75mL OMNIPAQUE  IOHEXOL  350 MG/ML SOLN COMPARISON:  Chest radiograph dated 02/23/2024. FINDINGS: Cardiovascular: There is no cardiomegaly or pericardial effusion. Mild atherosclerotic calcification of the thoracic aorta. No aneurysmal dilatation or dissection. The origins of the great vessels of the aortic arch are patent. No pulmonary artery embolus identified. Mediastinum/Nodes: Mildly enlarged right hilar lymph node measures 12 mm. No mediastinal adenopathy. The esophagus is grossly unremarkable no mediastinal fluid collection. Lungs/Pleura: Background of centrilobular emphysema. No focal consolidation, pleural effusion, pneumothorax. The central airways are patent. Upper Abdomen: No acute abnormality. Musculoskeletal: No acute osseous pathology. Review of the MIP images confirms the above findings. IMPRESSION: 1. No acute intrathoracic pathology. No CT evidence of pulmonary artery embolus. 2. Aortic Atherosclerosis (ICD10-I70.0) and Emphysema (ICD10-J43.9). Electronically Signed   By: Vanetta Chou M.D.   On: 02/23/2024 21:27   DG Chest 2 View Result Date: 02/23/2024 CLINICAL DATA:  Shortness of breath. EXAM: CHEST - 2 VIEW COMPARISON:  February 17, 2021. FINDINGS: The heart size and mediastinal  contours are within normal limits. Both lungs are clear. The visualized skeletal structures are unremarkable. IMPRESSION: No active cardiopulmonary disease. Electronically Signed   By: Lynwood Landy Raddle M.D.   On: 02/23/2024 16:20    Scheduled Meds:  amLODipine   10 mg Oral Daily   atorvastatin   10 mg Oral Daily   enoxaparin  (LOVENOX ) injection  40 mg Subcutaneous Q24H   hydrochlorothiazide   12.5 mg Oral Daily   [START ON 02/25/2024] influenza vac split trivalent PF  0.5 mL Intramuscular Tomorrow-1000   insulin  aspart  0-5 Units Subcutaneous QHS   insulin  aspart  0-6 Units Subcutaneous TID WC   losartan   50 mg Oral Daily   methylPREDNISolone  (SOLU-MEDROL ) injection  125 mg Intravenous Q12H   Followed by   NOREEN ON 02/25/2024] predniSONE   40 mg Oral Q breakfast   sodium chloride  flush  3 mL Intravenous Q12H   venlafaxine  XR  150 mg Oral Q breakfast   Continuous Infusions:  cefTRIAXone  (ROCEPHIN )  IV Stopped (02/24/24 0103)     LOS: 0 days   Ivonne Mustache, MD [facility] P9/10/2023, 7:43 AM

## 2024-02-24 NOTE — Plan of Care (Signed)

## 2024-02-25 ENCOUNTER — Other Ambulatory Visit (HOSPITAL_COMMUNITY): Payer: Self-pay

## 2024-02-25 DIAGNOSIS — J9801 Acute bronchospasm: Secondary | ICD-10-CM | POA: Diagnosis not present

## 2024-02-25 LAB — GLUCOSE, CAPILLARY
Glucose-Capillary: 136 mg/dL — ABNORMAL HIGH (ref 70–99)
Glucose-Capillary: 150 mg/dL — ABNORMAL HIGH (ref 70–99)

## 2024-02-25 LAB — CBC
HCT: 45.3 % (ref 36.0–46.0)
Hemoglobin: 14.8 g/dL (ref 12.0–15.0)
MCH: 29.7 pg (ref 26.0–34.0)
MCHC: 32.7 g/dL (ref 30.0–36.0)
MCV: 90.8 fL (ref 80.0–100.0)
Platelets: 311 K/uL (ref 150–400)
RBC: 4.99 MIL/uL (ref 3.87–5.11)
RDW: 12.9 % (ref 11.5–15.5)
WBC: 22.7 K/uL — ABNORMAL HIGH (ref 4.0–10.5)
nRBC: 0 % (ref 0.0–0.2)

## 2024-02-25 MED ORDER — GUAIFENESIN ER 600 MG PO TB12
600.0000 mg | ORAL_TABLET | Freq: Two times a day (BID) | ORAL | 0 refills | Status: AC
  Filled 2024-02-25: qty 20, 10d supply, fill #0

## 2024-02-25 MED ORDER — FLUTICASONE FUROATE-VILANTEROL 100-25 MCG/ACT IN AEPB
1.0000 | INHALATION_SPRAY | Freq: Every day | RESPIRATORY_TRACT | 0 refills | Status: DC
Start: 2024-02-25 — End: 2024-03-12
  Filled 2024-02-25: qty 60, 30d supply, fill #0

## 2024-02-25 MED ORDER — PREDNISONE 10 MG PO TABS
ORAL_TABLET | ORAL | 0 refills | Status: AC
  Filled 2024-02-25: qty 21, 9d supply, fill #0

## 2024-02-25 MED ORDER — FLUTICASONE FUROATE-VILANTEROL 100-25 MCG/ACT IN AEPB
1.0000 | INHALATION_SPRAY | Freq: Every day | RESPIRATORY_TRACT | Status: DC
  Filled 2024-02-25: qty 28

## 2024-02-25 MED ORDER — NICOTINE POLACRILEX 2 MG MT LOZG
2.0000 mg | LOZENGE | OROMUCOSAL | 0 refills | Status: AC | PRN
  Filled 2024-02-25: qty 72, 10d supply, fill #0

## 2024-02-25 NOTE — Discharge Summary (Signed)
 Physician Discharge Summary  Jennifer Waters [name] DOB: Mar 21, 1961 DOA: 02/23/2024  PCP: Norleen Lynwood ORN, MD  Admit date: 02/23/2024 Discharge date: 02/25/2024  Admitted From: Home Disposition:  Home  Discharge Condition:Stable CODE STATUS:FULL Diet recommendation: Heart Healthy     Brief/Interim Summary: Patient is a 63 year old female with history of hypertension, hyperlipidemia, type 2 diabetes, depression, migraines, currently smoker who presents with shortness of breath, cough, wheezing from home.  She had progressive dyspnea.  She has never been diagnosed with asthma or COPD.  Has been smoking a pack of cigarettes a day for last 40 years.  On presentation, she was tachycardic, saturating low 90s on room air, tachypneic, hypotensive.  Labs showed WC count of 11.3.  COVID/flu/rsv  was negative.CTA chest was negative for PE or acute intrathoracic abnormality but showed centrilobular emphysema.  Patient was admitted for the management of COPD exacerbation.  Started on IV steroids.  Initially required oxygen supplementation but has been now weaned to room air.  Feels much better today.  Still having some wheezing but feels ready to go home today.  We have provided a referral to pulmonology for outpatient follow-up.  Medically stable for discharge home today  Following problems were addressed during the hospitalization:    Acute COPD exacerbation: No history of COPD or asthma.  Lifelong smoker. Chest imaging did not show any pneumonia but shows emphysematous changes suggesting obstructive lung disease. Started on steroids, bronchodilators.   Feels much better today.  Has been weaned to room air.  She will continue slow taper of prednisone  at home.  Also started on Breo Ellipta .  Referral provided for pulmonology appointment   Acute hypoxic respiratory failure: Now weaned to room air   Tobacco abuse:  Has been smoking a pack of cigarettes a day for last 40 years.  Counseled for  cessation.     Leukocytosis: Progressively worsened after hospitalization.  This is most likely from steroids.  Continue to monitor.  Low suspicion for infectious process   Hypertension: On amlodipine , losartan , hydrochlorothiazide .  Continue same.   Type 2 diabetes: Last A1c was 7.1 as per [id number].  Takes metformin  at home  Hyperlipidemia: Continue Lipitor   Depression: Continue Effexor    Obesity: BMI of 33.9   Discharge Diagnoses:  Principal Problem:   Acute bronchospasm Active Problems:   Hyperlipidemia associated with type 2 diabetes mellitus (HCC)   Essential hypertension   Anxiety and depression   Non-insulin  dependent type 2 diabetes mellitus (HCC)   COPD exacerbation (HCC)    Discharge Instructions  Discharge Instructions     Diet - low sodium heart healthy   Complete by: As directed    Discharge instructions   Complete by: As directed    1)Please take your medications as instructed 2)Quit smoking 3)Follow up with your PCP next week 4)We have provided referral to pulmonology as an outpatient.  You might be called for appointment   Increase activity slowly   Complete by: As directed    Pulmonary Visit   Complete by: As directed    COPD   Reason for referral: Other Pulmonary      Allergies as of 02/25/2024       Reactions   Chantix  [varenicline ] Nausea Only        Medication List     STOP taking these medications    methylPREDNISolone  4 MG Tbpk tablet Commonly known as: MEDROL  DOSEPAK       TAKE these medications    Acetaminophen  500 MG capsule  1 capsule as needed Orally every 6 hrs   albuterol  108 (90 Base) MCG/ACT inhaler Commonly known as: VENTOLIN  HFA Inhale 2 puffs into the lungs every 6 (six) hours as needed for wheezing or shortness of breath.   amLODipine  10 MG tablet Commonly known as: NORVASC  Take 1 tablet (10 mg total) by mouth daily.   atorvastatin  10 MG tablet Commonly known as: LIPITOR Take 1 tablet (10 mg total) by  mouth daily.   cetirizine  10 MG tablet Commonly known as: ZYRTEC  1 tablet   cyclobenzaprine  5 MG tablet Commonly known as: FLEXERIL  TAKE 1 TABLET(5 MG) BY MOUTH THREE TIMES DAILY AS NEEDED FOR MUSCLE SPASMS What changed: See the new instructions.   fluticasone  50 MCG/ACT nasal spray Commonly known as: FLONASE  1 spray in each nostril   fluticasone  furoate-vilanterol 100-25 MCG/ACT Aepb Commonly known as: BREO ELLIPTA  Inhale 1 puff into the lungs daily.   guaiFENesin  600 MG 12 hr tablet Commonly known as: Mucinex  Take 1 tablet (600 mg total) by mouth 2 (two) times daily for 7 days.   losartan -hydrochlorothiazide  50-12.5 MG tablet Commonly known as: HYZAAR Take 1 tablet by mouth daily.   meloxicam  15 MG tablet Commonly known as: Mobic  Take 1 tablet (15 mg total) by mouth daily as needed for pain. What changed: when to take this   metFORMIN  500 MG 24 hr tablet Commonly known as: GLUCOPHAGE -XR Take 2 tablets (1,000 mg total) by mouth daily with breakfast.   nicotine  polacrilex 2 MG gum Commonly known as: NICORETTE  Take 1 each (2 mg total) by mouth as needed for smoking cessation.   omeprazole  20 MG capsule Commonly known as: PRILOSEC TAKE 1 CAPSULE BY MOUTH EVERY DAY   predniSONE  10 MG tablet Commonly known as: DELTASONE  Take 1 tablet (10 mg total) by mouth daily with breakfast. Take 4 pills daily for 2 days then 3 pills daily for 2 days then 2 pills daily for 2 days then 1 pill daily for 3 days then stop Start taking on: February 26, 2024   venlafaxine  XR 150 MG 24 hr capsule Commonly known as: EFFEXOR -XR Take 1 capsule (150 mg total) by mouth daily with breakfast.        Follow-up Information     Norleen Lynwood ORN, MD. Schedule an appointment as soon as possible for a visit in 1 week(s).   Specialties: Internal Medicine, Radiology Contact information: [address] [place] [address] [phone]                Allergies  Allergen Reactions    Chantix  [Varenicline ] Nausea Only    Consultations: None   Procedures/Studies: CT Angio Chest PE W and/or Wo Contrast Result Date: 02/23/2024 CLINICAL DATA:  Concern for pulmonary edema. EXAM: CT ANGIOGRAPHY CHEST WITH CONTRAST TECHNIQUE: Multidetector CT imaging of the chest was performed using the standard protocol during bolus administration of intravenous contrast. Multiplanar CT image reconstructions and MIPs were obtained to evaluate the vascular anatomy. RADIATION DOSE REDUCTION: This exam was performed according to the departmental dose-optimization program which includes automated exposure control, adjustment of the mA and/or kV according to patient size and/or use of iterative reconstruction technique. CONTRAST:  75mL OMNIPAQUE  IOHEXOL  350 MG/ML SOLN COMPARISON:  Chest radiograph dated 02/23/2024. FINDINGS: Cardiovascular: There is no cardiomegaly or pericardial effusion. Mild atherosclerotic calcification of the thoracic aorta. No aneurysmal dilatation or dissection. The origins of the great vessels of the aortic arch are patent. No pulmonary artery embolus identified. Mediastinum/Nodes: Mildly enlarged right hilar lymph  node measures 12 mm. No mediastinal adenopathy. The esophagus is grossly unremarkable no mediastinal fluid collection. Lungs/Pleura: Background of centrilobular emphysema. No focal consolidation, pleural effusion, pneumothorax. The central airways are patent. Upper Abdomen: No acute abnormality. Musculoskeletal: No acute osseous pathology. Review of the MIP images confirms the above findings. IMPRESSION: 1. No acute intrathoracic pathology. No CT evidence of pulmonary artery embolus. 2. Aortic Atherosclerosis (ICD10-I70.0) and Emphysema (ICD10-J43.9). Electronically Signed   By: Vanetta Chou M.D.   On: 02/23/2024 21:27   DG Chest 2 View Result Date: 02/23/2024 CLINICAL DATA:  Shortness of breath. EXAM: CHEST - 2 VIEW COMPARISON:  February 17, 2021. FINDINGS: The heart size  and mediastinal contours are within normal limits. Both lungs are clear. The visualized skeletal structures are unremarkable. IMPRESSION: No active cardiopulmonary disease. Electronically Signed   By: Lynwood Landy Raddle M.D.   On: 02/23/2024 16:20      Subjective: Patient seen and examined at bedside today.  Hemodynamically stable.  Comfortable today.  Not requiring oxygen at rest.  Still having some wheezes but denies any worsening shortness of breath or cough.  Medically stable for discharge home today  Discharge Exam: Vitals:   02/25/24 0543 02/25/24 0903  BP: (!) 148/87 (!) 140/95  Pulse: 84 (!) 107  Resp:    Temp: 97.8 F (36.6 C)   SpO2:     Vitals:   02/24/24 1302 02/24/24 2054 02/25/24 0543 02/25/24 0903  BP:  (!) 141/109 (!) 148/87 (!) 140/95  Pulse:  (!) 110 84 (!) 107  Resp:  18    Temp:  97.9 F (36.6 C) 97.8 F (36.6 C)   TempSrc:  Oral Oral   SpO2: 95% 92%    Weight:      Height:        General: Pt is alert, awake, not in acute distress, obese Cardiovascular: RRR, S1/S2 +, no rubs, no gallops Respiratory: CTA bilaterally, no wheezing, no rhonchi Abdominal: Soft, NT, ND, bowel sounds + Extremities: no edema, no cyanosis    The results of significant diagnostics from this hospitalization (including imaging, microbiology, ancillary and laboratory) are listed below for reference.     Microbiology: Recent Results (from the past 240 hours)  Resp panel by RT-PCR (RSV, Flu A&B, Covid) Anterior Nasal Swab     Status: None   Collection Time: 02/23/24  3:28 PM   Specimen: Anterior Nasal Swab  Result Value Ref Range Status   SARS Coronavirus 2 by RT PCR NEGATIVE NEGATIVE Final    Comment: (NOTE) SARS-CoV-2 target nucleic acids are NOT DETECTED.  The SARS-CoV-2 RNA is generally detectable in upper respiratory specimens during the acute phase of infection. The lowest concentration of SARS-CoV-2 viral copies this assay can detect is 138 copies/mL. A negative result  does not preclude SARS-Cov-2 infection and should not be used as the sole basis for treatment or other patient management decisions. A negative result may occur with  improper specimen collection/handling, submission of specimen other than nasopharyngeal swab, presence of viral mutation(s) within the areas targeted by this assay, and inadequate number of viral copies(<138 copies/mL). A negative result must be combined with clinical observations, patient history, and epidemiological information. The expected result is Negative.  Fact Sheet for Patients:  BloggerCourse.com  Fact Sheet for Healthcare Providers:  SeriousBroker.it  This test is no t yet approved or cleared by the [place]  FDA and  has been authorized for detection and/or diagnosis of SARS-CoV-2 by FDA under an Emergency Use Authorization (EUA).  This EUA will remain  in effect (meaning this test can be used) for the duration of the COVID-19 declaration under Section 564(b)(1) of the Act, 21 U.S.C.section 360bbb-3(b)(1), unless the authorization is terminated  or revoked sooner.       Influenza A by PCR NEGATIVE NEGATIVE Final   Influenza B by PCR NEGATIVE NEGATIVE Final    Comment: (NOTE) The Xpert Xpress SARS-CoV-2/FLU/RSV plus assay is intended as an aid in the diagnosis of influenza from Nasopharyngeal swab specimens and should not be used as a sole basis for treatment. Nasal washings and aspirates are unacceptable for Xpert Xpress SARS-CoV-2/FLU/RSV testing.  Fact Sheet for Patients: BloggerCourse.com  Fact Sheet for Healthcare Providers: SeriousBroker.it  This test is not yet approved or cleared by the [place]  FDA and has been authorized for detection and/or diagnosis of SARS-CoV-2 by FDA under an Emergency Use Authorization (EUA). This EUA will remain in effect (meaning this test can be used) for  the duration of the COVID-19 declaration under Section 564(b)(1) of the Act, 21 U.S.C. section 360bbb-3(b)(1), unless the authorization is terminated or revoked.     Resp Syncytial Virus by PCR NEGATIVE NEGATIVE Final    Comment: (NOTE) Fact Sheet for Patients: BloggerCourse.com  Fact Sheet for Healthcare Providers: SeriousBroker.it  This test is not yet approved or cleared by the [place]  FDA and has been authorized for detection and/or diagnosis of SARS-CoV-2 by FDA under an Emergency Use Authorization (EUA). This EUA will remain in effect (meaning this test can be used) for the duration of the COVID-19 declaration under Section 564(b)(1) of the Act, 21 U.S.C. section 360bbb-3(b)(1), unless the authorization is terminated or revoked.  Performed at [facility], [address], [place], [place] [address]      Labs: BNP (last 3 results) No results for input(s): BNP in the last 8760 hours. Basic Metabolic Panel: Recent Labs  Lab 02/23/24 1528 02/24/24 0428  NA 138 139  K 3.5 3.8  CL 101 100  CO2 23 19*  GLUCOSE 178* 217*  BUN 9 14  CREATININE 0.82 0.93  CALCIUM  10.0 10.6*   Liver Function Tests: No results for input(s): AST, ALT, ALKPHOS, BILITOT, PROT, ALBUMIN in the last 168 hours. No results for input(s): LIPASE, AMYLASE in the last 168 hours. No results for input(s): AMMONIA in the last 168 hours. CBC: Recent Labs  Lab 02/23/24 1528 02/24/24 0428 02/25/24 0552  WBC 11.3* 18.5* 22.7*  HGB 13.6 14.4 14.8  HCT 42.8 43.7 45.3  MCV 90.9 90.5 90.8  PLT 259 318 311   Cardiac Enzymes: No results for input(s): CKTOTAL, CKMB, CKMBINDEX, TROPONINI in the last 168 hours. BNP: Invalid input(s): POCBNP CBG: Recent Labs  Lab 02/24/24 0726 02/24/24 1200 02/24/24 1559 02/24/24 2047 02/25/24 0754  GLUCAP 184* 225* 173* 319* 136*   D-Dimer No results for  input(s): DDIMER in the last 72 hours. Hgb A1c No results for input(s): HGBA1C in the last 72 hours. Lipid Profile No results for input(s): CHOL, HDL, LDLCALC, TRIG, CHOLHDL, LDLDIRECT in the last 72 hours. Thyroid  function studies No results for input(s): TSH, T4TOTAL, T3FREE, THYROIDAB in the last 72 hours.  Invalid input(s): FREET3 Anemia work up No results for input(s): VITAMINB12, FOLATE, FERRITIN, TIBC, IRON, RETICCTPCT in the last 72 hours. Urinalysis    Component Value Date/Time   COLORURINE YELLOW 10/24/2023 1038   APPEARANCEUR CLEAR 10/24/2023 1038   LABSPEC 1.025 10/24/2023 1038   PHURINE 6.0 10/24/2023 1038   GLUCOSEU NEGATIVE 10/24/2023  1038   HGBUR NEGATIVE 10/24/2023 1038   BILIRUBINUR NEGATIVE 10/24/2023 1038   KETONESUR NEGATIVE 10/24/2023 1038   PROTEINUR NEGATIVE 02/28/2020 1241   UROBILINOGEN 0.2 10/24/2023 1038   NITRITE NEGATIVE 10/24/2023 1038   LEUKOCYTESUR NEGATIVE 10/24/2023 1038   Sepsis Labs Recent Labs  Lab 02/23/24 1528 02/24/24 0428 02/25/24 0552  WBC 11.3* 18.5* 22.7*   Microbiology Recent Results (from the past 240 hours)  Resp panel by RT-PCR (RSV, Flu A&B, Covid) Anterior Nasal Swab     Status: None   Collection Time: 02/23/24  3:28 PM   Specimen: Anterior Nasal Swab  Result Value Ref Range Status   SARS Coronavirus 2 by RT PCR NEGATIVE NEGATIVE Final    Comment: (NOTE) SARS-CoV-2 target nucleic acids are NOT DETECTED.  The SARS-CoV-2 RNA is generally detectable in upper respiratory specimens during the acute phase of infection. The lowest concentration of SARS-CoV-2 viral copies this assay can detect is 138 copies/mL. A negative result does not preclude SARS-Cov-2 infection and should not be used as the sole basis for treatment or other patient management decisions. A negative result may occur with  improper specimen collection/handling, submission of specimen other than nasopharyngeal swab,  presence of viral mutation(s) within the areas targeted by this assay, and inadequate number of viral copies(<138 copies/mL). A negative result must be combined with clinical observations, patient history, and epidemiological information. The expected result is Negative.  Fact Sheet for Patients:  BloggerCourse.com  Fact Sheet for Healthcare Providers:  SeriousBroker.it  This test is no t yet approved or cleared by the [place]  FDA and  has been authorized for detection and/or diagnosis of SARS-CoV-2 by FDA under an Emergency Use Authorization (EUA). This EUA will remain  in effect (meaning this test can be used) for the duration of the COVID-19 declaration under Section 564(b)(1) of the Act, 21 U.S.C.section 360bbb-3(b)(1), unless the authorization is terminated  or revoked sooner.       Influenza A by PCR NEGATIVE NEGATIVE Final   Influenza B by PCR NEGATIVE NEGATIVE Final    Comment: (NOTE) The Xpert Xpress SARS-CoV-2/FLU/RSV plus assay is intended as an aid in the diagnosis of influenza from Nasopharyngeal swab specimens and should not be used as a sole basis for treatment. Nasal washings and aspirates are unacceptable for Xpert Xpress SARS-CoV-2/FLU/RSV testing.  Fact Sheet for Patients: BloggerCourse.com  Fact Sheet for Healthcare Providers: SeriousBroker.it  This test is not yet approved or cleared by the [place]  FDA and has been authorized for detection and/or diagnosis of SARS-CoV-2 by FDA under an Emergency Use Authorization (EUA). This EUA will remain in effect (meaning this test can be used) for the duration of the COVID-19 declaration under Section 564(b)(1) of the Act, 21 U.S.C. section 360bbb-3(b)(1), unless the authorization is terminated or revoked.     Resp Syncytial Virus by PCR NEGATIVE NEGATIVE Final    Comment: (NOTE) Fact Sheet for  Patients: BloggerCourse.com  Fact Sheet for Healthcare Providers: SeriousBroker.it  This test is not yet approved or cleared by the [place]  FDA and has been authorized for detection and/or diagnosis of SARS-CoV-2 by FDA under an Emergency Use Authorization (EUA). This EUA will remain in effect (meaning this test can be used) for the duration of the COVID-19 declaration under Section 564(b)(1) of the Act, 21 U.S.C. section 360bbb-3(b)(1), unless the authorization is terminated or revoked.  Performed at [facility], [address], [place], [place] [address]     Please note: You were  cared for by a hospitalist during your hospital stay. Once you are discharged, your primary care physician will handle any further medical issues. Please note that NO REFILLS for any discharge medications will be authorized once you are discharged, as it is imperative that you return to your primary care physician (or establish a relationship with a primary care physician if you do not have one) for your post hospital discharge needs so that they can reassess your need for medications and monitor your lab values.    Time coordinating discharge: 40 minutes  SIGNED:   Ivonne Mustache, MD  [facility] 02/25/2024, 11:07 AM Pager [phone]  If 7PM-7AM, please contact night-coverage [email or web address] Password [email or web address]

## 2024-02-25 NOTE — Progress Notes (Signed)
 Discharge meds in a secure bag delivered to pt in room by this RN

## 2024-02-25 NOTE — Progress Notes (Signed)
   02/25/24 1241  TOC Brief Assessment  Insurance and Status Reviewed  Patient has primary care physician Yes  Home environment has been reviewed single family home  Prior level of function: independent  Prior/Current Home Services No current home services  Social Drivers of Health Review SDOH reviewed no interventions necessary  Readmission risk has been reviewed Yes  Transition of care needs no transition of care needs at this time    Signed: Heather Saltness, MSW, LCSW Clinical Social Worker Inpatient Care Management 02/25/2024 12:41 PM

## 2024-02-27 ENCOUNTER — Telehealth: Payer: Self-pay | Admitting: *Deleted

## 2024-02-27 NOTE — Transitions of Care (Post Inpatient/ED Visit) (Signed)
 02/27/2024  Name: Jennifer Waters MRN: [id number] DOB: Nov 26, 1960  Today's TOC FU Call Status: Today's TOC FU Call Status:: Successful TOC FU Call Completed TOC FU Call Complete Date: 02/27/24 Patient's Name and Date of Birth confirmed.  Transition Care Management Follow-up Telephone Call Date of Discharge: 02/25/24 Discharge Facility: [facility]) Type of Discharge: Inpatient Admission Primary Inpatient Discharge Diagnosis:: Acute bronchospasm How have you been since you were released from the hospital?: Better Any questions or concerns?: No  Items Reviewed: Did you receive and understand the discharge instructions provided?: Yes Medications obtained,verified, and reconciled?: Yes (Medications Reviewed) Any new allergies since your discharge?: No Dietary orders reviewed?: No Do you have support at home?: Yes People in Home [RPT]: child(ren), adult Name of Support/Comfort Primary Source: Jennifer Waters  Medications Reviewed Today: Medications Reviewed Today     Reviewed by Jennifer Cathlean VEAR, RN (Case Manager) on 02/27/24 at 1137  Med List Status: <None>   Medication Order Taking? Sig Documenting Provider Last Dose Status Informant  Acetaminophen  500 MG capsule [id number] Yes 1 capsule as needed Orally every 6 hrs [provider]  Active Self, Pharmacy Records  albuterol  (VENTOLIN  HFA) 108 (90 Base) MCG/ACT inhaler [id number] Yes Inhale 2 puffs into the lungs every 6 (six) hours as needed for wheezing or shortness of breath. Norleen Lynwood ORN, MD  Active Self, Pharmacy Records  amLODipine  (NORVASC ) 10 MG tablet [id number] Yes Take 1 tablet (10 mg total) by mouth daily. Anner Alm ORN, MD  Active Self, Pharmacy Records  atorvastatin  (LIPITOR) 10 MG tablet [id number] Yes Take 1 tablet (10 mg total) by mouth daily. Anner Alm ORN, MD  Active Self, Pharmacy Records  cetirizine  (ZYRTEC ) 10 MG tablet [id number] Yes 1 tablet [provider]  Active Self, Pharmacy  Records  cyclobenzaprine  (FLEXERIL ) 5 MG tablet [id number] Yes TAKE 1 TABLET(5 MG) BY MOUTH THREE TIMES DAILY AS NEEDED FOR MUSCLE SPASMS  Patient taking differently: Take 15 mg by mouth daily at 6 (six) AM.   Norleen Lynwood ORN, MD  Active Self, Pharmacy Records  fluticasone  (FLONASE ) 50 MCG/ACT nasal spray [id number] Yes 1 spray in each nostril [provider]  Active Self, Pharmacy Records  fluticasone  furoate-vilanterol (BREO ELLIPTA ) 100-25 MCG/ACT AEPB [id number] Yes Inhale 1 puff into the lungs daily. Jillian Buttery, MD  Active   guaiFENesin  (MUCINEX ) 600 MG 12 hr tablet [id number] Yes Take 1 tablet (600 mg total) by mouth 2 (two) times daily for 7 days. Jillian Buttery, MD  Active   losartan -hydrochlorothiazide  (HYZAAR) 50-12.5 MG tablet [id number] Yes Take 1 tablet by mouth daily. Anner Alm ORN, MD  Active Self, Pharmacy Records  meloxicam  (MOBIC ) 15 MG tablet [id number] Yes Take 1 tablet (15 mg total) by mouth daily as needed for pain.  Patient taking differently: Take 15 mg by mouth daily.   Norleen Lynwood ORN, MD  Active Self, Pharmacy Records  metFORMIN  (GLUCOPHAGE -XR) 500 MG 24 hr tablet [id number] Yes Take 2 tablets (1,000 mg total) by mouth daily with breakfast. Norleen Lynwood ORN, MD  Active Self, Pharmacy Records  nicotine  polacrilex (COMMIT) 2 MG lozenge [id number] Yes Take 1 lozenge (2 mg total) by mouth as needed for smoking cessation. Jillian Buttery, MD  Active   omeprazole  (PRILOSEC) 20 MG capsule [id number] Yes TAKE 1 CAPSULE BY MOUTH EVERY DAY Dragnev, Romualdo Capers, NP  Active Self, Pharmacy Records  predniSONE  (DELTASONE ) 10 MG tablet [id number] Yes Take 4 tablets (40 mg total) by mouth daily with breakfast for 2 days, THEN  3 tablets (30 mg total) daily with breakfast for 2 days, THEN 2 tablets (20 mg total) daily with breakfast for 2 days, THEN 1 tablet (10 mg total) daily with breakfast for 3 days. Jillian Buttery, MD  Active   venlafaxine  XR (EFFEXOR -XR) 150 MG 24 hr capsule  [id number]  Take 1 capsule (150 mg total) by mouth daily with breakfast. Norleen Lynwood ORN, MD  Active Self, Pharmacy Records            Home Care and Equipment/Supplies: Were Home Health Services Ordered?: NA Any new equipment or medical supplies ordered?: NA  Functional Questionnaire: Do you need assistance with bathing/showering or dressing?: No Do you need assistance with meal preparation?: No Do you need assistance with eating?: No Do you have difficulty maintaining continence: No Do you need assistance with getting out of bed/getting out of a chair/moving?: No Do you have difficulty managing or taking your medications?: No  Follow up appointments reviewed: PCP Follow-up appointment confirmed?: Yes Date of PCP follow-up appointment?: 03/06/24 Follow-up Provider: Dr Lynwood Norleen Specialist [facility] Follow-up appointment confirmed?: No Reason Specialist Follow-Up Not Confirmed: Patient has Specialist Provider Number and will Call for Appointment (Patient has specialist number and is looking for a call from them) Do you need transportation to your follow-up appointment?: No Do you understand care options if your condition(s) worsen?: Yes-patient verbalized understanding  SDOH Interventions Today    Flowsheet Row Most Recent Value  SDOH Interventions   Food Insecurity Interventions Intervention Not Indicated  Housing Interventions Intervention Not Indicated  Transportation Interventions Intervention Not Indicated  Utilities Interventions Intervention Not Indicated   RN discussed quit smoking  RN discussed referral to pulmonology Discussed and offered 30 day TOC program.  Patient   declined.  The patient has been provided with contact information for the care management team and has been advised to call with any health -related questions or concerns.  The patient verbalized understanding with current plan of care.  The patient is directed to their insurance card regarding  availability of benefits coverage.  Cathlean Headland BSN RN [facility] Health Care Management Coordinator [email or web address] Direct Dial: [phone]  Fax: [phone] Website: [email or web address]

## 2024-03-06 ENCOUNTER — Inpatient Hospital Stay: Admitting: Internal Medicine

## 2024-03-12 ENCOUNTER — Encounter: Payer: Self-pay | Admitting: Internal Medicine

## 2024-03-12 ENCOUNTER — Ambulatory Visit (INDEPENDENT_AMBULATORY_CARE_PROVIDER_SITE_OTHER): Admitting: Internal Medicine

## 2024-03-12 VITALS — BP 132/82 | HR 80 | Temp 98.2°F | Ht 69.0 in | Wt 232.8 lb

## 2024-03-12 DIAGNOSIS — M79671 Pain in right foot: Secondary | ICD-10-CM

## 2024-03-12 DIAGNOSIS — K219 Gastro-esophageal reflux disease without esophagitis: Secondary | ICD-10-CM

## 2024-03-12 DIAGNOSIS — Z7984 Long term (current) use of oral hypoglycemic drugs: Secondary | ICD-10-CM

## 2024-03-12 DIAGNOSIS — E1169 Type 2 diabetes mellitus with other specified complication: Secondary | ICD-10-CM | POA: Diagnosis not present

## 2024-03-12 DIAGNOSIS — J449 Chronic obstructive pulmonary disease, unspecified: Secondary | ICD-10-CM

## 2024-03-12 DIAGNOSIS — E559 Vitamin D deficiency, unspecified: Secondary | ICD-10-CM | POA: Diagnosis not present

## 2024-03-12 DIAGNOSIS — R0683 Snoring: Secondary | ICD-10-CM

## 2024-03-12 DIAGNOSIS — M79672 Pain in left foot: Secondary | ICD-10-CM | POA: Diagnosis not present

## 2024-03-12 DIAGNOSIS — J441 Chronic obstructive pulmonary disease with (acute) exacerbation: Secondary | ICD-10-CM

## 2024-03-12 DIAGNOSIS — I10 Essential (primary) hypertension: Secondary | ICD-10-CM | POA: Diagnosis not present

## 2024-03-12 DIAGNOSIS — E119 Type 2 diabetes mellitus without complications: Secondary | ICD-10-CM

## 2024-03-12 DIAGNOSIS — E785 Hyperlipidemia, unspecified: Secondary | ICD-10-CM

## 2024-03-12 MED ORDER — FLUTICASONE FUROATE-VILANTEROL 100-25 MCG/ACT IN AEPB: 1.0000 | INHALATION_SPRAY | Freq: Every day | RESPIRATORY_TRACT | 11 refills | Status: AC

## 2024-03-12 MED ORDER — PANTOPRAZOLE SODIUM 40 MG PO TBEC: 40.0000 mg | DELAYED_RELEASE_TABLET | Freq: Every day | ORAL | 3 refills | Status: AC

## 2024-03-12 MED ORDER — FLUTICASONE PROPIONATE 50 MCG/ACT NA SUSP: 2.0000 | Freq: Every day | NASAL | 6 refills | Status: AC

## 2024-03-12 NOTE — Assessment & Plan Note (Signed)
 Clinically resolved, to finish steroid taper, cont breo asd and albuterol  hfa pan

## 2024-03-12 NOTE — Assessment & Plan Note (Signed)
 BP Readings from Last 3 Encounters:  03/12/24 132/82  02/25/24 (!) 140/95  10/24/23 124/78   Stable, pt to continue medical treatment norvasc  10 every day, hyzaar 50 12.5 qd

## 2024-03-12 NOTE — Assessment & Plan Note (Signed)
 Last vitamin D  Lab Results  Component Value Date   VD25OH 39.20 10/24/2023   Low, to start oral replacement

## 2024-03-12 NOTE — Patient Instructions (Addendum)
 Ok to restart the Flonase  that you mentioned  Please take all new medication as prescribed - the protonix  40 mg per day  Please continue all other medications as before, and refills have been done   Please have the pharmacy call with any other refills you may need.  Please continue your efforts at being more active, low cholesterol diet, and weight control.  Please keep your appointments with your specialists as you may have planned  - Pulmonary for the COPD, but also to check for Sleep Apnea  You will be contacted regarding the referral for: Podiatry for the feet pain  Please make an Appointment to return in 6 months, or sooner if needed

## 2024-03-12 NOTE — Assessment & Plan Note (Signed)
 Lab Results  Component Value Date   HGBA1C 7.1 (H) 10/24/2023   Mild uncontrolled, pt to continue current medical treatment metformin  ER 500 mg - 2 every day,

## 2024-03-12 NOTE — Assessment & Plan Note (Signed)
 Lab Results  Component Value Date   LDLCALC 66 10/24/2023   Stable, pt to continue current statin lipitor 10 mg qd

## 2024-03-12 NOTE — Assessment & Plan Note (Signed)
 With mild worsening, for protonxi 40 qd

## 2024-03-12 NOTE — Assessment & Plan Note (Signed)
 Likely to need orthotics - for podiatry referral

## 2024-03-12 NOTE — Progress Notes (Signed)
 Patient ID: Jennifer Waters, female   DOB: December 04, 1960, 63 y.o.   MRN: [id number]        Chief Complaint: follow up s/p hospn 9/4- 9/6       HPI:  Jennifer Waters is a 63 y.o. female here with above copd exacerbation with daughter for support; pt treated with IV steroid and o2 and bronchodilators, to which she did well, weaned off o2 and finished steroid post d/c.  Tolerating Breo well, needs refill.  Pt denies chest pain, increased sob or doe, wheezing, orthopnea, PND, increased LE swelling, palpitations, dizziness or syncope.   Pt denies polydipsia, polyuria, or new focal neuro s/s.    Pt denies fever, wt loss, night sweats, and pt did not require antibiotic. Still smoking, not ready to quit.  Does also have marked snoring at night per family with AM headaches required 4 tylenol  every am.  Has had mild worsening reflux, but no abd pain, dysphagia, n/v, bowel change or blood.  Also had bilateral mid foot plantar pain worse to walk. Plans to restart flonase  for allergic rhinitis.       Wt Readings from Last 3 Encounters:  03/12/24 232 lb 12.8 oz (105.6 kg)  02/24/24 229 lb 11.2 oz (104.2 kg)  10/24/23 231 lb (104.8 kg)   BP Readings from Last 3 Encounters:  03/12/24 132/82  02/25/24 (!) 140/95  10/24/23 124/78         Past Medical History:  Diagnosis Date   Arthritis    Depression    Hypertension    Migraines    Non-insulin  dependent type 2 diabetes mellitus (HCC) 09/18/2021   Obesity    Past Surgical History:  Procedure Laterality Date   ABDOMINAL HYSTERECTOMY     BREAST MASS EXCISION     benign done as a child   COLONOSCOPY  01/2019   TRANSTHORACIC ECHOCARDIOGRAM  12/2018    EF 55-60%.  Impaired relaxation but indeterminate filling pressures.  Normal RV size and function.  Normal valves.  Normal aortic root.    reports that she quit smoking about 2 years ago. Her smoking use included cigarettes. She started smoking about 22 years ago. She has a 10 pack-year smoking history.  She has never used smokeless tobacco. She reports that she does not currently use alcohol after a past usage of about 5.0 standard drinks of alcohol per week. She reports that she does not currently use drugs after having used the following drugs: Marijuana. Frequency: 1.00 time per week. family history includes Arrhythmia in her sister; Arthritis in her mother and sister; Brain cancer in her brother; Diabetes in her brother; Edema in her sister; Lung cancer in her father; Migraines in her mother; Ovarian cancer in her mother. Allergies  Allergen Reactions   Chantix  [Varenicline ] Nausea Only   Current Outpatient Medications on File Prior to Visit  Medication Sig Dispense Refill   Acetaminophen  500 MG capsule 1 capsule as needed Orally every 6 hrs     albuterol  (VENTOLIN  HFA) 108 (90 Base) MCG/ACT inhaler Inhale 2 puffs into the lungs every 6 (six) hours as needed for wheezing or shortness of breath. 8 g 2   amLODipine  (NORVASC ) 10 MG tablet Take 1 tablet (10 mg total) by mouth daily. 90 tablet 3   atorvastatin  (LIPITOR) 10 MG tablet Take 1 tablet (10 mg total) by mouth daily. 90 tablet 3   cetirizine  (ZYRTEC ) 10 MG tablet 1 tablet     cyclobenzaprine  (FLEXERIL ) 5 MG tablet TAKE 1  TABLET(5 MG) BY MOUTH THREE TIMES DAILY AS NEEDED FOR MUSCLE SPASMS (Patient taking differently: Take 15 mg by mouth daily at 6 (six) AM.) 90 tablet 2   losartan -hydrochlorothiazide  (HYZAAR) 50-12.5 MG tablet Take 1 tablet by mouth daily. 90 tablet 3   meloxicam  (MOBIC ) 15 MG tablet Take 1 tablet (15 mg total) by mouth daily as needed for pain. (Patient taking differently: Take 15 mg by mouth daily.) 90 tablet 3   metFORMIN  (GLUCOPHAGE -XR) 500 MG 24 hr tablet Take 2 tablets (1,000 mg total) by mouth daily with breakfast. 180 tablet 3   nicotine  polacrilex (COMMIT) 2 MG lozenge Take 1 lozenge (2 mg total) by mouth as needed for smoking cessation. 72 lozenge 0   omeprazole  (PRILOSEC) 20 MG capsule TAKE 1 CAPSULE BY MOUTH  EVERY DAY 90 capsule 0   venlafaxine  XR (EFFEXOR -XR) 150 MG 24 hr capsule Take 1 capsule (150 mg total) by mouth daily with breakfast. 90 capsule 2   No current facility-administered medications on file prior to visit.        ROS:  All others reviewed and negative.  Objective        PE:  BP 132/82   Pulse 80   Temp 98.2 F (36.8 C)   Ht 5' 9 (1.753 m)   Wt 232 lb 12.8 oz (105.6 kg)   SpO2 99%   BMI 34.38 kg/m                 Constitutional: Pt appears in NAD               HENT: Head: NCAT.                Right Ear: External ear normal.                 Left Ear: External ear normal.                Eyes: . Pupils are equal, round, and reactive to light. Conjunctivae and EOM are normal               Nose: without d/c or deformity               Neck: Neck supple. Gross normal ROM               Cardiovascular: Normal rate and regular rhythm.                 Pulmonary/Chest: Effort normal and breath sounds without rales or wheezing.                Abd:  Soft, NT, ND, + BS, no organomegaly               Neurological: Pt is alert. At baseline orientation, motor grossly intact               Skin: Skin is warm. No rashes, no other new lesions, LE edema - none               Psychiatric: Pt behavior is normal without agitation   Micro: none  Cardiac tracings I have personally interpreted today:  none  Pertinent Radiological findings (summarize): none   Lab Results  Component Value Date   WBC 22.7 (H) 02/25/2024   HGB 14.8 02/25/2024   HCT 45.3 02/25/2024   PLT 311 02/25/2024   GLUCOSE 217 (H) 02/24/2024   CHOL 139 10/24/2023   TRIG 71.0 10/24/2023   HDL 59.30 10/24/2023  LDLCALC 66 10/24/2023   ALT 22 10/24/2023   AST 15 10/24/2023   NA 139 02/24/2024   K 3.8 02/24/2024   CL 100 02/24/2024   CREATININE 0.93 02/24/2024   BUN 14 02/24/2024   CO2 19 (L) 02/24/2024   TSH 1.24 10/24/2023   HGBA1C 7.1 (H) 10/24/2023   MICROALBUR 0.8 10/24/2023   Assessment/Plan:   Jennifer Waters is a 63 y.o. Black or African American [2] female with  has a past medical history of Arthritis, Depression, Hypertension, Migraines, Non-insulin  dependent type 2 diabetes mellitus (HCC) (09/18/2021), and Obesity.  Vitamin D  deficiency Last vitamin D  Lab Results  Component Value Date   VD25OH 39.20 10/24/2023   Low, to start oral replacement   Non-insulin  dependent type 2 diabetes mellitus (HCC) Lab Results  Component Value Date   HGBA1C 7.1 (H) 10/24/2023   Mild uncontrolled, pt to continue current medical treatment metformin  ER 500 mg - 2 every day,    Hyperlipidemia associated with type 2 diabetes mellitus (HCC) Lab Results  Component Value Date   LDLCALC 66 10/24/2023   Stable, pt to continue current statin lipitor 10 mg qd   GERD (gastroesophageal reflux disease) With mild worsening, for protonxi 40 qd  Essential hypertension BP Readings from Last 3 Encounters:  03/12/24 132/82  02/25/24 (!) 140/95  10/24/23 124/78   Stable, pt to continue medical treatment norvasc  10 every day, hyzaar 50 12.5 qd   COPD exacerbation (HCC) Clinically resolved, to finish steroid taper, cont breo asd and albuterol  hfa pan  Snoring With AM headaches - high risk for OSA - pt for f/u with pulm as already referred  Bilateral foot pain Likely to need orthotics - for podiatry referral  Followup: Return in about 6 months (around 09/09/2024).  Lynwood Rush, MD 03/12/2024 8:07 PM [facility] Internal Medicine

## 2024-03-12 NOTE — Assessment & Plan Note (Signed)
 With AM headaches - high risk for OSA - pt for f/u with pulm as already referred

## 2024-03-28 ENCOUNTER — Other Ambulatory Visit: Payer: Self-pay | Admitting: Internal Medicine

## 2024-03-28 NOTE — Telephone Encounter (Unsigned)
 Copied from CRM #[id number]. Topic: Clinical - Medication Refill >> Mar 28, 2024  9:54 AM Maisie C wrote: Medication: meloxicam   Has the patient contacted their pharmacy? Yes No more refills  This is the patient's preferred pharmacy:  [facility] [address], [place] - [address] AT [address] [place] [address] Phone: [phone] Fax: [phone]   Is this the correct pharmacy for this prescription? Yes If no, delete pharmacy and type the correct one.   Has the prescription been filled recently? No  Is the patient out of the medication? Yes  Has the patient been seen for an appointment in the last year OR does the patient have an upcoming appointment? Yes  Can we respond through MyChart? No  Agent: Please be advised that Rx refills may take up to 3 business days. We ask that you follow-up with your pharmacy.

## 2024-03-29 ENCOUNTER — Ambulatory Visit

## 2024-03-29 ENCOUNTER — Other Ambulatory Visit: Payer: Self-pay

## 2024-03-29 DIAGNOSIS — T148XXA Other injury of unspecified body region, initial encounter: Secondary | ICD-10-CM

## 2024-03-29 DIAGNOSIS — G5752 Tarsal tunnel syndrome, left lower limb: Secondary | ICD-10-CM

## 2024-03-29 DIAGNOSIS — M7752 Other enthesopathy of left foot: Secondary | ICD-10-CM

## 2024-03-29 DIAGNOSIS — M7751 Other enthesopathy of right foot: Secondary | ICD-10-CM | POA: Diagnosis not present

## 2024-03-29 MED ORDER — GABAPENTIN 300 MG PO CAPS: 300.0000 mg | ORAL_CAPSULE | Freq: Every day | ORAL | 0 refills | Status: AC

## 2024-03-29 MED ORDER — MELOXICAM 15 MG PO TABS: 15.0000 mg | ORAL_TABLET | Freq: Every day | ORAL | 3 refills | Status: AC | PRN

## 2024-03-29 NOTE — Progress Notes (Signed)
  Subjective:  Patient ID: Jennifer Waters, female    DOB: 1961-05-04,  MRN: [id number]  Chief Complaint  Patient presents with   Foot Pain    Bilateral foot pain met 1-5 dorsal R foot is worse. Also has some tingling/numbness plantar.  Diabetic A1c 7.1 No anti coag    Discussed the use of AI scribe software for clinical note transcription with the patient, who gave verbal consent to proceed.  History of Present Illness Jennifer Waters is a 63 year old female who presents with bilateral foot pain and numbness.  She experiences progressive pain and numbness in both feet, with a tingling sensation primarily at the bottom of her feet and occasional 'zinging' sensations towards her toes, especially on the left foot. These symptoms affect her ability to walk comfortably.  Pain also occurs on the top of her left foot, particularly in areas lacking support, and varies with different types of shoes, worsening with less supportive footwear. On the right foot, soreness is present on the top of her small toes when wearing certain shoes, resembling nerve pain, but does not affect the big toe.      Objective:    Physical Exam Neurovascular status: Radiating/zinging sensations with compression of the tibial nerve in the left foot and superficial peroneal nerve in the right foot. Right SPN and left Tibial nerve exhibit positive Tinel's sign. She relates to some numbness in the plantar aspect of the left foot and soreness to the dorsal aspect of right foot digits 2-5. Protective sensation grossly intact. Pain to bilateral dorsal midfoot when wearing non-supportive shoe, unable to reproduce the pain clinically.  MUSCULOSKELETAL: Mild pes planus foot shape bilaterally with full tendon strength. No contributing deformities. No osteophyte formation dorsal left foot.  V/Derm: intact with palpable pedal pulses and no open lesions or wounds     Results RADIOLOGY Foot X-ray: 3WB views of each foot were  taken today. No acute osseous abnormalities. No signs of arthritis in the midfoot joints, joint spaces diffusely well preserved. Mild pes planus foot structure with increased talar declination.    Assessment:   1. Capsulitis of metatarsophalangeal (MTP) joint of left foot   2. Capsulitis of metatarsophalangeal (MTP) joint of right foot   3. Tarsal tunnel syndrome of left side   4. Nerve compression      Plan:  Patient was evaluated and treated and all questions answered.  Assessment and Plan Assessment & Plan Left foot tarsal tunnel syndrome Chronic pain and numbness with zinging sensations upon tibial nerve compression, consistent with tarsal tunnel syndrome. No arthritis on x-ray. Full tendon strength. - Order nerve conduction study for the left foot. - Prescribe gabapentin  at night for nerve pain. 300mg  PO once daily at nighttime before bed.  - Advise supportive shoes with stiff soles.  Right foot superficial peroneal nerve mononeuropathy Pain and numbness with radiating sensations upon superficial peroneal nerve compression. Full tendon strength. - Order nerve conduction study for the right foot. - Prescribe gabapentin  at night for nerve pain. - Advise supportive shoes with stiff soles.  Pain to midfoot, L>R - Use supportive shoes with stiff soles   RTC after EMG/NCV

## 2024-03-29 NOTE — Addendum Note (Signed)
 Addended by: MAGDALEN BARTER on: 03/29/2024 01:01 PM   Modules accepted: Orders

## 2024-04-05 ENCOUNTER — Ambulatory Visit

## 2024-04-05 VITALS — BP 112/78 | HR 100 | Temp 98.0°F | Ht 69.0 in | Wt 227.6 lb

## 2024-04-05 DIAGNOSIS — F17210 Nicotine dependence, cigarettes, uncomplicated: Secondary | ICD-10-CM | POA: Diagnosis not present

## 2024-04-05 DIAGNOSIS — J441 Chronic obstructive pulmonary disease with (acute) exacerbation: Secondary | ICD-10-CM

## 2024-04-05 DIAGNOSIS — Z720 Tobacco use: Secondary | ICD-10-CM

## 2024-04-05 MED ORDER — BUPROPION HCL ER (SR) 150 MG PO TB12: 150.0000 mg | ORAL_TABLET | Freq: Two times a day (BID) | ORAL | 3 refills | Status: AC

## 2024-04-05 NOTE — Patient Instructions (Addendum)
  VISIT SUMMARY: Today, we discussed your recent COPD exacerbation and hospitalization, your smoking history, and your interest in quitting smoking. We also talked about lung cancer screening due to your smoking history.  YOUR PLAN: -CHRONIC OBSTRUCTIVE PULMONARY DISEASE (COPD) WITH RECENT EXACERBATION AND EMPHYSEMA: COPD is a chronic lung disease that makes it hard to breathe, and emphysema is a type of COPD that damages the air sacs in your lungs. We will continue your current inhaler and albuterol  for emergencies, and we will order pulmonary function tests (PFTs) to assess your lung function. You will also receive a sample of a new inhaler that does not require mouth rinsing.  -TOBACCO USE DISORDER: Tobacco use disorder is a dependence on tobacco products. You are motivated to quit smoking, and we will start you on Wellbutrin , which you will take 150 mg daily for one week, then 150 mg twice daily. You can also use over-the-counter nicotine  patches and lozenges as needed. Set a quit date and start Wellbutrin  one week before that date. For additional support, you can call 1-800-QUIT-NOW for free nicotine  patches. Exercise is recommended to help with quitting and to prevent weight gain.  -ANNUAL LUNG CANCER SCREENING: Due to your smoking history, you are eligible for annual lung cancer screening. Early detection can be beneficial, although there is a risk of false positives. We will schedule a low-dose CT scan for lung cancer screening in September 2026.  INSTRUCTIONS: Please follow up with the pulmonary function tests (PFTs) as ordered. Continue using your current inhaler and albuterol  as needed. Start Wellbutrin  as prescribed and set a quit date for smoking. Schedule your annual low-dose CT scan for lung cancer screening in September 2026.

## 2024-04-05 NOTE — Progress Notes (Signed)
 Subjective:   PATIENT ID: Jennifer Waters GENDER: female DOB: [birth year], MRN: [id number]   HPI Discussed the use of AI scribe software for clinical note transcription with the patient, who gave verbal consent to proceed.  History of Present Illness Jennifer Waters is a 63 year old female with COPD who presents with a recent exacerbation. She is accompanied by her daughter.  She was recently hospitalized for severe breathing difficulties, requiring a two-day stay. During her admission, she received steroids and nebulizer treatments. Upon discharge, she was prescribed prednisone  to continue at home and was given an inhaler, possibly Symbicort, which requires rinsing the mouth after use. She also has an albuterol  inhaler for emergencies, which she has not yet needed to use.  She has a long history of smoking, having started at age 36. She currently smokes about half a pack per day, down from a previous one pack per day, and has been smoking for approximately 45 years. This recent exacerbation was her first experience with significant breathing difficulties, which began with a cough and progressed to severe shortness of breath, limiting her ability to walk even short distances in her home.  Her family history includes a father who had emphysema and a brother with COPD. She has concerns about recent home construction, which involved significant dust exposure, potentially contributing to her respiratory issues.  She has received a flu shot during her recent hospital stay and has previously received a pneumonia shot at her primary care provider's office.  She is interested in quitting smoking, having previously tried Chantix , which caused nausea.     Past Medical History:  Diagnosis Date   Arthritis    Depression    Hypertension    Migraines    Non-insulin  dependent type 2 diabetes mellitus (HCC) 09/18/2021   Obesity      Family History  Problem Relation Age of Onset    Arthritis Mother    Ovarian cancer Mother    Migraines Mother    Lung cancer Father    Arrhythmia Sister    Brain cancer Brother    Edema Sister    Arthritis Sister    Diabetes Brother    Colon polyps Neg Hx    Colon cancer Neg Hx    Esophageal cancer Neg Hx    Rectal cancer Neg Hx    Stomach cancer Neg Hx      Social History   Socioeconomic History   Marital status: Divorced    Spouse name: Not on file   Number of children: 1   Years of education: 14   Highest education level: Not on file  Occupational History   Occupation: Disablity  Tobacco Use   Smoking status: Former    Current packs/day: 0.00    Average packs/day: 0.5 packs/day for 20.0 years (10.0 ttl pk-yrs)    Types: Cigarettes    Start date: 05/21/2001    Quit date: 05/21/2021    Years since quitting: 2.8   Smokeless tobacco: Never   Tobacco comments:    Smokes half a pack a day   Vaping Use   Vaping status: Never Used  Substance and Sexual Activity   Alcohol use: Not Currently    Alcohol/week: 5.0 standard drinks of alcohol    Types: 5 Glasses of wine per week   Drug use: Not Currently    Frequency: 1.0 times per week    Types: Marijuana   Sexual activity: Yes    Birth control/protection: Surgical  Other  Topics Concern   Not on file  Social History Narrative   Fun: Puzzles, cats   Denies any religious beliefs effecting health care. Lives with daughter in a one story home.  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  Education: some college.      Lives with her daughter and grandchild   Social Drivers of Health   Financial Resource Strain: Low Risk  (06/23/2023)   Overall Financial Resource Strain (CARDIA)    Difficulty of Paying Living Expenses: Not very hard  Food Insecurity: No Food Insecurity (02/27/2024)   Hunger Vital Sign    Worried About Running Out of Food in the Last Year: Never true    Ran Out of Food in the Last Year: Never true  Transportation Needs: No Transportation Needs (02/27/2024)   PRAPARE  - Administrator, Civil Service (Medical): No    Lack of Transportation (Non-Medical): No  Physical Activity: Inactive (06/23/2023)   Exercise Vital Sign    Days of Exercise per Week: 0 days    Minutes of Exercise per Session: 0 min  Stress: No Stress Concern Present (06/23/2023)   [employer] of Occupational Health - Occupational Stress Questionnaire    Feeling of Stress : Not at all  Social Connections: Moderately Integrated (02/24/2024)   Social Connection and Isolation Panel    Frequency of Communication with Friends and Family: More than three times a week    Frequency of Social Gatherings with Friends and Family: Once a week    Attends Religious Services: 1 to 4 times per year    Active Member of [employer] or Organizations: No    Attends Banker Meetings: Never    Marital Status: Married  Catering manager Violence: Unknown (02/27/2024)   Humiliation, Afraid, Rape, and Kick questionnaire    Fear of Current or Ex-Partner: No    Emotionally Abused: No    Physically Abused: No    Sexually Abused: Not on file     Allergies  Allergen Reactions   Chantix  [Varenicline ] Nausea Only     Outpatient Medications Prior to Visit  Medication Sig Dispense Refill   Acetaminophen  500 MG capsule 1 capsule as needed Orally every 6 hrs     albuterol  (VENTOLIN  HFA) 108 (90 Base) MCG/ACT inhaler Inhale 2 puffs into the lungs every 6 (six) hours as needed for wheezing or shortness of breath. 8 g 2   amLODipine  (NORVASC ) 10 MG tablet Take 1 tablet (10 mg total) by mouth daily. 90 tablet 3   atorvastatin  (LIPITOR) 10 MG tablet Take 1 tablet (10 mg total) by mouth daily. 90 tablet 3   cetirizine  (ZYRTEC ) 10 MG tablet 1 tablet     cyclobenzaprine  (FLEXERIL ) 5 MG tablet TAKE 1 TABLET(5 MG) BY MOUTH THREE TIMES DAILY AS NEEDED FOR MUSCLE SPASMS (Patient taking differently: Take 15 mg by mouth daily at 6 (six) AM.) 90 tablet 2   fluticasone  (FLONASE ) 50 MCG/ACT nasal spray Place 2  sprays into both nostrils daily. 16 g 6   fluticasone  furoate-vilanterol (BREO ELLIPTA ) 100-25 MCG/ACT AEPB Inhale 1 puff into the lungs daily. 60 each 11   gabapentin  (NEURONTIN ) 300 MG capsule Take 1 capsule (300 mg total) by mouth daily after supper. 60 capsule 0   losartan -hydrochlorothiazide  (HYZAAR) 50-12.5 MG tablet Take 1 tablet by mouth daily. 90 tablet 3   meloxicam  (MOBIC ) 15 MG tablet Take 1 tablet (15 mg total) by mouth daily as needed for pain. 90 tablet 3   metFORMIN  (  GLUCOPHAGE -XR) 500 MG 24 hr tablet Take 2 tablets (1,000 mg total) by mouth daily with breakfast. 180 tablet 3   nicotine  polacrilex (COMMIT) 2 MG lozenge Take 1 lozenge (2 mg total) by mouth as needed for smoking cessation. 72 lozenge 0   omeprazole  (PRILOSEC) 20 MG capsule TAKE 1 CAPSULE BY MOUTH EVERY DAY 90 capsule 0   pantoprazole  (PROTONIX ) 40 MG tablet Take 1 tablet (40 mg total) by mouth daily. 90 tablet 3   venlafaxine  XR (EFFEXOR -XR) 150 MG 24 hr capsule Take 1 capsule (150 mg total) by mouth daily with breakfast. 90 capsule 2   No facility-administered medications prior to visit.    ROS Reviewed all systems and reported negative except as above     Objective:   Vitals:   04/05/24 1109  BP: 112/78  Pulse: 100  Temp: 98 F (36.7 C)  TempSrc: Oral  SpO2: 92%  Weight: 227 lb 9.6 oz (103.2 kg)  Height: 5' 9 (1.753 m)    Physical Exam Constitutional:      Appearance: Normal appearance.  HENT:     Head: Normocephalic.     Nose: Nose normal.     Mouth/Throat:     Mouth: Mucous membranes are dry.  Cardiovascular:     Rate and Rhythm: Normal rate and regular rhythm.  Pulmonary:     Effort: Pulmonary effort is normal.  Abdominal:     General: Abdomen is flat.  Skin:    General: Skin is warm.     Capillary Refill: Capillary refill takes less than 2 seconds.  Neurological:     Mental Status: She is alert.    Physical Exam CHEST: Lungs clear to auscultation, no  wheezing. CARDIOVASCULAR: Heart regular rhythm, no murmurs.     CBC    Component Value Date/Time   WBC 22.7 (H) 02/25/2024 0552   RBC 4.99 02/25/2024 0552   HGB 14.8 02/25/2024 0552   HCT 45.3 02/25/2024 0552   PLT 311 02/25/2024 0552   MCV 90.8 02/25/2024 0552   MCH 29.7 02/25/2024 0552   MCHC 32.7 02/25/2024 0552   RDW 12.9 02/25/2024 0552   LYMPHSABS 6.2 (H) 10/24/2023 1038   MONOABS 1.0 10/24/2023 1038   EOSABS 0.1 10/24/2023 1038   BASOSABS 0.1 10/24/2023 1038     Chest imaging:  PFT:     No data to display          Labs:    Echo:       Assessment & Plan:   Assessment and Plan Assessment & Plan Chronic obstructive pulmonary disease (COPD) with recent exacerbation and emphysema Recent exacerbation required hospitalization. CT shows emphysema. Differential includes COPD, adult-onset asthma, or viral infection. PFTs needed for diagnosis confirmation. - Order PFTs to assess lung function. - Start Stiolto for COPD instead of symbicort - Continue albuterol  as rescue inhaler.  Tobacco use disorder Long-term smoker, motivated to quit. Previous Chantix  caused nausea. Wellbutrin  and NRT planned. Discussed smoking risks and quitting benefits. - Prescribe Wellbutrin  150 mg daily for one week, then 150 mg twice daily. - Advise over-the-counter nicotine  patches and lozenges as needed. - Encourage setting a quit date and starting Wellbutrin  one week prior. - Provide 1-800-QUIT-NOW information for free nicotine  patches. - Recommend exercise to aid cessation and prevent weight gain.  Smoking/Tobacco Cessation Counseling JANNE FAULK is a current user of tobacco or nicotine  products. She is ready to quit at this time. Counseling provided today addressed the risks of continued use and the benefits  of cessation. Discussed tobacco/nicotine  use history, readiness to quit, and evidence-based treatment options including behavioral strategies, support resources, and  pharmacologic therapies. Provided encouragement and educational materials on steps and resources to quit smoking. Patient questions were addressed, and follow-up recommended for continued support. Total time spent on counseling: 4 minutes.     Annual lung cancer screening Eligible for screening due to smoking history. Discussed early detection benefits and false positive risks. Agreed to screening. - Schedule annual low-dose CT scan for lung cancer screening in September 2026.        Zola Herter, MD [facility] Office: [phone]

## 2024-04-20 ENCOUNTER — Encounter: Payer: Self-pay | Admitting: Pharmacist

## 2024-04-20 NOTE — Progress Notes (Signed)
 Pharmacy Quality Measure Review  This patient is appearing on a report for being at risk of failing the adherence measure for diabetes medications this calendar year.   Medication: Metformin  Last fill date: 04/10/24 for 90 day supply  Insurance report was not up to date. No action needed at this time.   Darrelyn Drum, PharmD, BCPS, CPP Clinical Pharmacist Practitioner [facility] [phone]

## 2024-05-11 ENCOUNTER — Other Ambulatory Visit: Payer: Self-pay | Admitting: Internal Medicine

## 2024-05-11 ENCOUNTER — Other Ambulatory Visit: Payer: Self-pay

## 2024-05-23 ENCOUNTER — Other Ambulatory Visit: Payer: Self-pay | Admitting: Internal Medicine

## 2024-05-24 ENCOUNTER — Telehealth: Payer: Self-pay

## 2024-05-24 ENCOUNTER — Ambulatory Visit

## 2024-05-24 VITALS — BP 130/78 | HR 89

## 2024-05-24 DIAGNOSIS — R053 Chronic cough: Secondary | ICD-10-CM

## 2024-05-24 DIAGNOSIS — J449 Chronic obstructive pulmonary disease, unspecified: Secondary | ICD-10-CM

## 2024-05-24 DIAGNOSIS — Z87891 Personal history of nicotine dependence: Secondary | ICD-10-CM | POA: Diagnosis not present

## 2024-05-24 DIAGNOSIS — J441 Chronic obstructive pulmonary disease with (acute) exacerbation: Secondary | ICD-10-CM

## 2024-05-24 DIAGNOSIS — K219 Gastro-esophageal reflux disease without esophagitis: Secondary | ICD-10-CM | POA: Diagnosis not present

## 2024-05-24 DIAGNOSIS — Z720 Tobacco use: Secondary | ICD-10-CM

## 2024-05-24 LAB — PULMONARY FUNCTION TEST
DL/VA % pred: 69 %
DL/VA: 2.85 ml/min/mmHg/L
DLCO unc % pred: 57 %
DLCO unc: 12.76 ml/min/mmHg
FEF 25-75 Post: 1.22 L/s
FEF 25-75 Pre: 0.96 L/s
FEF2575-%Change-Post: 26 %
FEF2575-%Pred-Post: 49 %
FEF2575-%Pred-Pre: 39 %
FEV1-%Change-Post: 7 %
FEV1-%Pred-Post: 65 %
FEV1-%Pred-Pre: 60 %
FEV1-Post: 1.83 L
FEV1-Pre: 1.71 L
FEV1FVC-%Change-Post: 1 %
FEV1FVC-%Pred-Pre: 84 %
FEV6-%Change-Post: 5 %
FEV6-%Pred-Post: 76 %
FEV6-%Pred-Pre: 72 %
FEV6-Post: 2.7 L
FEV6-Pre: 2.57 L
FEV6FVC-%Change-Post: 1 %
FEV6FVC-%Pred-Post: 103 %
FEV6FVC-%Pred-Pre: 102 %
FVC-%Change-Post: 6 %
FVC-%Pred-Post: 75 %
FVC-%Pred-Pre: 71 %
FVC-Post: 2.76 L
FVC-Pre: 2.6 L
Post FEV1/FVC ratio: 66 %
Post FEV6/FVC ratio: 100 %
Pre FEV1/FVC ratio: 66 %
Pre FEV6/FVC Ratio: 99 %
RV % pred: 142 %
RV: 3.12 L
TLC % pred: 108 %
TLC: 6.02 L

## 2024-05-24 MED ORDER — BENZONATATE 100 MG PO CAPS: 200.0000 mg | ORAL_CAPSULE | Freq: Two times a day (BID) | ORAL | 1 refills | Status: AC | PRN

## 2024-05-24 NOTE — Patient Instructions (Signed)
  VISIT SUMMARY: Today, we discussed your progress after quitting smoking and addressed your ongoing symptoms related to COPD and GERD. You have been experiencing hoarseness and a persistent cough since you stopped smoking. We reviewed your current medications and made some adjustments to help manage your symptoms better.  YOUR PLAN: -CHRONIC OBSTRUCTIVE PULMONARY DISEASE WITH EMPHYSEMA: COPD is a chronic lung condition that makes it hard to breathe. Your recent symptoms are likely due to your lungs starting to heal after quitting smoking. Continue using the Stiolto inhaler as prescribed (2 puffs daily), and you have been referred to virtual pulmonary rehab. Follow the GERD diet instructions provided and use the prescribed cough pearls at night. Contact our office if you notice any worsening of your COPD symptoms.  -TOBACCO USE DISORDER, IN REMISSION: You have successfully quit smoking, which is essential for preventing further lung damage. Continue taking Wellbutrin  150 mg twice daily to help manage cravings and anxiety. You can also use nicotine  patches and lozenges if needed to control cravings. Keep up the great work on staying smoke-free!  -ANNUAL LUNG CANCER SCREENING: Due to your history of smoking, you are eligible for annual lung cancer screenings. Make sure to follow up for your next screening as scheduled.  -GASTROESOPHAGEAL REFLUX DISEASE: GERD is a condition where stomach acid frequently flows back into the tube connecting your mouth and stomach, causing irritation. This may be contributing to your nighttime cough. Follow the GERD diet instructions and use the prescribed cough pearls at night to help manage your symptoms.  INSTRUCTIONS: Please follow up for your annual lung cancer screening as scheduled. Contact our office if you experience any symptoms of a COPD exacerbation, such as increased shortness of breath, wheezing, or changes in your cough.  GERD diet instructions  Foods to  Avoid:  Acidic foods: Citrus fruits, tomatoes, tomato-based products, vinegar, garlic, onions  Fatty foods: Fried foods, fatty meats, whole milk, cheese  Spicy foods: Chili peppers, black pepper, mustard  Chocolate: Contains caffeine and theobromine, which relax the lower esophageal sphincter (LES)  Alcohol: Relaxes the LES and increases stomach acid production  Caffeine: Found in coffee, tea, and some sodas, it can stimulate stomach acid production    Foods to Eat:  Lean proteins: Chicken, fish, eggs Non-acidic fruits: Apples, bananas, pears, grapes Vegetables: Steamed, roasted, or boiled vegetables (e.g., broccoli, carrots, green beans) Whole grains: Brown rice, quinoa, oatmeal Low-fat dairy: Skim milk, low-fat yogurt, low-fat cheese Water: Staying hydrated helps dilute stomach acid    Other Dietary Recommendations: Eat smaller, more frequent meals. Avoid eating within 2-3 hours of bedtime. Chew food thoroughly. Limit sugary drinks and processed foods. Consider a Mediterranean-style diet, which is rich in fruits, vegetables, and whole grains.          Contains text generated by [employer].

## 2024-05-24 NOTE — Telephone Encounter (Signed)
 Copied from CRM [id number]. Topic: Clinical - Medical Advice >> May 23, 2024 11:01 AM Dedra B wrote: Reason for CRM: Pt's grandson has Covid and she wants to know if she needs to get a Covid test. She dos not have nay symptoms right now. Pls call pt.

## 2024-05-24 NOTE — Progress Notes (Signed)
 Subjective:   PATIENT ID: Jennifer Waters GENDER: female DOB: [birth year], MRN: [id number]   HPI Discussed the use of AI scribe software for clinical note transcription with the patient, who gave verbal consent to proceed.  History of Present Illness Jennifer Waters is a 63 year old female with moderate COPD who presents for follow-up after quitting smoking.  She quit smoking cigarettes and marijuana approximately 20 days ago. She is currently using Wellbutrin  150 mg twice daily, which she finds helpful for managing cravings and anxiety. She is also using the Stiolto inhaler once daily, although the prescribed dose is two puffs daily.  She has been experiencing hoarseness and voice loss for the past two to three weeks, coinciding with her cessation of smoking. She describes difficulty talking and a persistent cough, which she is unsure if it is new or a continuation of an old cough. Her daughter notes that she wakes up coughing at night, a symptom that has been present for years.   She has a history of being admitted to the hospital for a COPD exacerbation prior to meetingme.  She has received a flu shot and a pneumonia vaccine in the past. She also takes medication for acid reflux, which is well-controlled with her current regimen.     Past Medical History:  Diagnosis Date   Arthritis    Depression    Hypertension    Migraines    Non-insulin  dependent type 2 diabetes mellitus (HCC) 09/18/2021   Obesity      Family History  Problem Relation Age of Onset   Arthritis Mother    Ovarian cancer Mother    Migraines Mother    Lung cancer Father    Arrhythmia Sister    Brain cancer Brother    Edema Sister    Arthritis Sister    Diabetes Brother    Colon polyps Neg Hx    Colon cancer Neg Hx    Esophageal cancer Neg Hx    Rectal cancer Neg Hx    Stomach cancer Neg Hx      Social History   Socioeconomic History   Marital status: Divorced    Spouse name: Not on file    Number of children: 1   Years of education: 14   Highest education level: Not on file  Occupational History   Occupation: Disablity  Tobacco Use   Smoking status: Former    Current packs/day: 0.00    Average packs/day: 0.5 packs/day for 20.0 years (10.0 ttl pk-yrs)    Types: Cigarettes    Start date: 05/21/2001    Quit date: 05/21/2021    Years since quitting: 3.0   Smokeless tobacco: Never   Tobacco comments:    Smokes half a pack a day   Vaping Use   Vaping status: Never Used  Substance and Sexual Activity   Alcohol use: Not Currently    Alcohol/week: 5.0 standard drinks of alcohol    Types: 5 Glasses of wine per week   Drug use: Not Currently    Frequency: 1.0 times per week    Types: Marijuana   Sexual activity: Yes    Birth control/protection: Surgical  Other Topics Concern   Not on file  Social History Narrative   Fun: Puzzles, cats   Denies any religious beliefs effecting health care. Lives with daughter in a one story home.  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  Education: some college.      Lives with her daughter and  grandchild   Social Drivers of Corporate Investment Banker Strain: Low Risk  (06/23/2023)   Overall Financial Resource Strain (CARDIA)    Difficulty of Paying Living Expenses: Not very hard  Food Insecurity: No Food Insecurity (02/27/2024)   Hunger Vital Sign    Worried About Running Out of Food in the Last Year: Never true    Ran Out of Food in the Last Year: Never true  Transportation Needs: No Transportation Needs (02/27/2024)   PRAPARE - Administrator, Civil Service (Medical): No    Lack of Transportation (Non-Medical): No  Physical Activity: Inactive (06/23/2023)   Exercise Vital Sign    Days of Exercise per Week: 0 days    Minutes of Exercise per Session: 0 min  Stress: No Stress Concern Present (06/23/2023)   [employer] of Occupational Health - Occupational Stress Questionnaire    Feeling of Stress : Not at all  Social  Connections: Moderately Integrated (02/24/2024)   Social Connection and Isolation Panel    Frequency of Communication with Friends and Family: More than three times a week    Frequency of Social Gatherings with Friends and Family: Once a week    Attends Religious Services: 1 to 4 times per year    Active Member of [employer] or Organizations: No    Attends Banker Meetings: Never    Marital Status: Married  Catering Manager Violence: Unknown (02/27/2024)   Humiliation, Afraid, Rape, and Kick questionnaire    Fear of Current or Ex-Partner: No    Emotionally Abused: No    Physically Abused: No    Sexually Abused: Not on file     Allergies  Allergen Reactions   Chantix  [Varenicline ] Nausea Only     Outpatient Medications Prior to Visit  Medication Sig Dispense Refill   Acetaminophen  500 MG capsule 1 capsule as needed Orally every 6 hrs     albuterol  (VENTOLIN  HFA) 108 (90 Base) MCG/ACT inhaler Inhale 2 puffs into the lungs every 6 (six) hours as needed for wheezing or shortness of breath. 8 g 2   amLODipine  (NORVASC ) 10 MG tablet Take 1 tablet (10 mg total) by mouth daily. 90 tablet 3   atorvastatin  (LIPITOR) 10 MG tablet Take 1 tablet (10 mg total) by mouth daily. 90 tablet 3   buPROPion  (WELLBUTRIN  SR) 150 MG 12 hr tablet Take 1 tablet (150 mg total) by mouth 2 (two) times daily. 60 tablet 3   cetirizine  (ZYRTEC ) 10 MG tablet 1 tablet     cyclobenzaprine  (FLEXERIL ) 5 MG tablet TAKE 1 TABLET(5 MG) BY MOUTH THREE TIMES DAILY AS NEEDED FOR MUSCLE SPASMS 90 tablet 2   fluticasone  (FLONASE ) 50 MCG/ACT nasal spray Place 2 sprays into both nostrils daily. 16 g 6   fluticasone  furoate-vilanterol (BREO ELLIPTA ) 100-25 MCG/ACT AEPB Inhale 1 puff into the lungs daily. 60 each 11   gabapentin  (NEURONTIN ) 300 MG capsule Take 1 capsule (300 mg total) by mouth daily after supper. 60 capsule 0   losartan -hydrochlorothiazide  (HYZAAR) 50-12.5 MG tablet Take 1 tablet by mouth daily. 90 tablet 3    meloxicam  (MOBIC ) 15 MG tablet Take 1 tablet (15 mg total) by mouth daily as needed for pain. 90 tablet 3   metFORMIN  (GLUCOPHAGE -XR) 500 MG 24 hr tablet TAKE 2 TABLETS(1000 MG) BY MOUTH DAILY WITH BREAKFAST 180 tablet 3   nicotine  polacrilex (COMMIT) 2 MG lozenge Take 1 lozenge (2 mg total) by mouth as needed for smoking cessation. 72 lozenge 0  omeprazole  (PRILOSEC) 20 MG capsule TAKE 1 CAPSULE BY MOUTH EVERY DAY 90 capsule 0   pantoprazole  (PROTONIX ) 40 MG tablet Take 1 tablet (40 mg total) by mouth daily. 90 tablet 3   venlafaxine  XR (EFFEXOR -XR) 150 MG 24 hr capsule Take 1 capsule (150 mg total) by mouth daily with breakfast. 90 capsule 2   No facility-administered medications prior to visit.    ROS Reviewed all systems and reported negative except as above     Objective:   Vitals:   05/24/24 1500  BP: 130/78  Pulse: 89    Physical Exam Physical Exam GENERAL: Appropriate to age, no acute distress. HEAD EYES EARS NOSE THROAT: Moist mucous membranes, atraumatic, normocephalic. CHEST: Clear to auscultation bilaterally, no wheezing, no crackles, no rales. CARDIAC: Regular rate and rhythm, normal S1, normal S2, no murmurs, no rubs, no gallops. ABDOMEN: Soft, nontender. NEUROLOGICAL: Motor and sensation grossly intact, alert and oriented times X 3. EXTREMITIES: Warm, well perfused, no edema.     CBC    Component Value Date/Time   WBC 22.7 (H) 02/25/2024 0552   RBC 4.99 02/25/2024 0552   HGB 14.8 02/25/2024 0552   HCT 45.3 02/25/2024 0552   PLT 311 02/25/2024 0552   MCV 90.8 02/25/2024 0552   MCH 29.7 02/25/2024 0552   MCHC 32.7 02/25/2024 0552   RDW 12.9 02/25/2024 0552   LYMPHSABS 6.2 (H) 10/24/2023 1038   MONOABS 1.0 10/24/2023 1038   EOSABS 0.1 10/24/2023 1038   BASOSABS 0.1 10/24/2023 1038     Chest imaging: I reviewed her CT chest performed on 02/23/24 which shows centrilobular emphysema.  PFT:    Latest Ref Rng & Units 05/24/2024    9:37 AM  PFT  Results  FVC-Pre L 2.60   FVC-Predicted Pre % 71   FVC-Post L 2.76   FVC-Predicted Post % 75   Pre FEV1/FVC % % 66   Post FEV1/FCV % % 66   FEV1-Pre L 1.71   FEV1-Predicted Pre % 60   FEV1-Post L 1.83   DLCO uncorrected ml/min/mmHg 12.76   DLCO UNC% % 57   DLVA Predicted % 69   TLC L 6.02   TLC % Predicted % 108   RV % Predicted % 142          Assessment & Plan:   Assessment and Plan Assessment & Plan Chronic obstructive pulmonary disease with emphysema Moderate to severe COPD confirmed by PFT. Recent exacerbation likely due to cilia regeneration post-smoking cessation. Voice hoarseness possibly related to smoking cessation or GERD. - Continue Stiolto inhaler, 2 puffs daily. - Referred to virtual pulmonary rehab. - Provided instructions for GERD diet. - Prescribed cough pearls for nighttime use. - Advised to contact office if experiencing COPD exacerbation symptoms. - Received flu shot and pneumonia shot   Tobacco use disorder, in remission Successfully quit smoking on November 19th, 2024. Smoking cessation crucial to prevent further lung damage. - Continue Wellbutrin  150 mg twice daily. - Encouraged continued smoking cessation. - Advised use of nicotine  patches and lozenges as needed for cravings.  Annual lung cancer screening Eligible for annual screening due to smoking history. Previous scan in September 2024. - Ensure follow-up for lung cancer screening.  Gastroesophageal reflux disease Nighttime cough possibly due to silent acid reflux. Currently on medication for acid reduction. - Implement GERD diet. - Prescribed cough pearls for nighttime use.        Zola Herter, MD [facility] Office: [phone]

## 2024-05-24 NOTE — Progress Notes (Signed)
 Full pft performed today

## 2024-05-24 NOTE — Patient Instructions (Signed)
 Full pft performed today

## 2024-05-25 NOTE — Telephone Encounter (Signed)
 No, ok to hold off on covid testing, but rememeber there are OTC tests that can be purchased if she starts having symptoms over the weekend.  Thanks!

## 2024-05-25 NOTE — Telephone Encounter (Signed)
 Please inform the pt of the following per PCP advice No, ok to hold off on covid testing, but rememeber there are OTC tests that can be purchased if she starts having symptoms over the weekend.  Thanks!

## 2024-05-29 ENCOUNTER — Ambulatory Visit: Payer: Self-pay

## 2024-05-29 NOTE — Telephone Encounter (Signed)
 FYI Only or Action Required?: FYI only for provider: appointment scheduled on 05/30/24.  Patient was last seen in primary care on 03/12/2024 by Norleen Lynwood ORN, MD.  Called Nurse Triage reporting Cough, Hoarse, and Sore Throat.  Symptoms began 2 weeks ago.  Interventions attempted: OTC medications: Tylenol .  Symptoms are: gradually worsening.  Triage Disposition: See Physician Within 24 Hours  Patient/caregiver understands and will follow disposition?: Yes             Copied from CRM [id number]. Topic: Clinical - Red Word Triage >> May 29, 2024  1:56 PM Sasha M wrote: Red Word that prompted transfer to Nurse Triage: sore throat, coughing green mucus, no voice. Started about 2 weeks ago, Renny is daughter on line Reason for Disposition  SEVERE sore throat pain  Answer Assessment - Initial Assessment Questions 1. DESCRIPTION: Describe your voice. (e.g., coarse, raspy, weaker, airy, scratchy, deeper)     Hoarse, scratchy. Daughter on phone for triage due to patient's voice.  2. SEVERITY: How bad is it?     Patient still has her voice but very scratchy and hoarse.  3. ONSET: When did the hoarseness begin?     X 2 week.  4. COUGH: Is there a cough? If Yes, ask: How bad is it?     Yes, green mucous coming up. Worse at night, some during the day more moderate.  5. FEVER: Do you have a fever? If Yes, ask: What is your temperature, how was it measured, and when did it start?     No.  6. ALLERGIES: Do you have any allergy symptoms? If Yes, ask: What are they? (e.g., nose stuffiness)     No.  7. IRRITANTS: Do you smoke? Have you been exposed to any irritating fumes? (e.g., smoke)     Recently quit smoking about a month ago.  8. CAUSE: What do you think is causing the hoarseness?     Her grandson had COVID last week.  9. OTHER SYMPTOMS: Do you have any other symptoms? (e.g., breathing difficulty, fever, foreign body, lymph node swelling in neck, rash,  sore throat, swallowing difficulty, weight loss)     Sore throat 10/10, daughter states she is treating with Tylenol .  Protocols used: [facility]

## 2024-05-30 ENCOUNTER — Ambulatory Visit: Admitting: Internal Medicine

## 2024-05-30 ENCOUNTER — Encounter: Payer: Self-pay | Admitting: Internal Medicine

## 2024-05-30 VITALS — BP 120/80 | HR 78 | Temp 98.2°F | Ht 69.0 in | Wt 236.0 lb

## 2024-05-30 DIAGNOSIS — J441 Chronic obstructive pulmonary disease with (acute) exacerbation: Secondary | ICD-10-CM

## 2024-05-30 DIAGNOSIS — F17211 Nicotine dependence, cigarettes, in remission: Secondary | ICD-10-CM

## 2024-05-30 MED ORDER — PROMETHAZINE-DM 6.25-15 MG/5ML PO SYRP: 5.0000 mL | ORAL_SOLUTION | Freq: Four times a day (QID) | ORAL | 0 refills | Status: AC | PRN

## 2024-05-30 MED ORDER — DOXYCYCLINE HYCLATE 100 MG PO TABS: 100.0000 mg | ORAL_TABLET | Freq: Two times a day (BID) | ORAL | 0 refills | Status: AC

## 2024-05-30 NOTE — Patient Instructions (Signed)
 We have sent in doxycycline  to take 1 pill twice a day for 1 week.  We have sent in promethazine /dm to use for cough up to 3 times a day for 5 mL.

## 2024-05-30 NOTE — Progress Notes (Signed)
° °  Subjective:   Patient ID: Jennifer Waters, female    DOB: 1960-10-21, 63 y.o.   MRN: [id number]  Discussed the use of AI scribe software for clinical note transcription with the patient, who gave verbal consent to proceed.  History of Present Illness Jennifer Waters is a 63 year old female who presents with a sore throat and hoarseness.  She has been experiencing a sore throat and hoarseness for the past two to three weeks, with severe pain especially when swallowing, radiating to her right ear. The symptoms have worsened over the last couple of days.  She has been coughing up mucus, described as 'drainage, like rain.' No fevers or chills. Her right ear hurts when her throat hurts, but there is no sinus pressure or nasal congestion.  She recently quit smoking, which has improved her breathing overall, although her breathing has worsened slightly since the onset of her sore throat. She has been using Tylenol  500 mg for pain relief, taking up to 2000 mg at night to help with sleep.  She continues to use a Breo inhaler and a nasal spray as part of her ongoing treatment regimen.  Review of Systems  Constitutional:  Positive for activity change. Negative for appetite change, chills, fatigue, fever and unexpected weight change.  HENT:  Positive for congestion, ear pain and sore throat. Negative for ear discharge, postnasal drip, rhinorrhea, sinus pressure, sinus pain, sneezing, tinnitus, trouble swallowing and voice change.   Eyes: Negative.   Respiratory:  Positive for cough and shortness of breath. Negative for chest tightness and wheezing.   Cardiovascular: Negative.   Gastrointestinal: Negative.   Musculoskeletal:  Positive for myalgias.  Neurological: Negative.     Objective:  Physical Exam Constitutional:      Appearance: She is well-developed.  HENT:     Head: Normocephalic and atraumatic.     Comments: Oropharynx with redness and clear drainage, nose with swollen  turbinates, TMs normal bilaterally.  Neck:     Thyroid : No thyromegaly.  Cardiovascular:     Rate and Rhythm: Normal rate and regular rhythm.  Pulmonary:     Effort: Pulmonary effort is normal. No respiratory distress.     Breath sounds: Rhonchi present. No wheezing or rales.  Abdominal:     Palpations: Abdomen is soft.  Musculoskeletal:        General: No tenderness.     Cervical back: Normal range of motion.  Lymphadenopathy:     Cervical: No cervical adenopathy.  Skin:    General: Skin is warm and dry.  Neurological:     Mental Status: She is alert and oriented to person, place, and time.     Vitals:   05/30/24 0817  BP: 120/80  Pulse: 78  Temp: 98.2 F (36.8 C)  TempSrc: Oral  SpO2: 97%  Weight: 236 lb (107 kg)  Height: 5' 9 (1.753 m)    Assessment and Plan Assessment & Plan COPD exacerbation   Going on about 2 weeks and worsening symptoms overall. She does have moderate to severe COPD and recent nicotine  cessation. Prescribe doxycycline  100 mg twice daily for 7 days for its efficacy in sinuses, throat, and lungs. Rx done for promethazine /dm cough medicine to help with sleep.   Nicotine  dependence, in remission   She has successfully quit smoking, resulting in improved breathing. Increased coughing is noted, consistent with post-smoking cessation changes.

## 2024-06-27 ENCOUNTER — Ambulatory Visit: Payer: Medicare Other

## 2024-06-27 VITALS — Ht 69.0 in | Wt 236.0 lb

## 2024-06-27 DIAGNOSIS — Z0000 Encounter for general adult medical examination without abnormal findings: Secondary | ICD-10-CM | POA: Diagnosis not present

## 2024-06-27 NOTE — Patient Instructions (Signed)
 Jennifer Waters,  Thank you for taking the time for your Medicare Wellness Visit. I appreciate your continued commitment to your health goals. Please review the care plan we discussed, and feel free to reach out if I can assist you further.  Please note that Annual Wellness Visits do not include a physical exam. Some assessments may be limited, especially if the visit was conducted virtually. If needed, we may recommend an in-person follow-up with your provider.  Ongoing Care Seeing your primary care provider every 3 to 6 months helps us  monitor your health and provide consistent, personalized care.   Referrals If a referral was made during today's visit and you haven't received any updates within two weeks, please contact the referred provider directly to check on the status.  Recommended Screenings:  Health Maintenance  Topic Date Due   Eye exam for diabetics  10/25/2023   Hemoglobin A1C  04/25/2024   Medicare Annual Wellness Visit  06/22/2024   Yearly kidney health urinalysis for diabetes  10/23/2024   Complete foot exam   10/23/2024   Breast Cancer Screening  10/24/2024   Yearly kidney function blood test for diabetes  02/23/2025   Colon Cancer Screening  02/23/2026   DTaP/Tdap/Td vaccine (2 - Td or Tdap) 06/04/2027   Pneumococcal Vaccine for age over 82  Completed   Flu Shot  Completed   Hepatitis C Screening  Completed   HIV Screening  Completed   Zoster (Shingles) Vaccine  Completed   Hepatitis B Vaccine  Aged Out   HPV Vaccine  Aged Out   Meningitis B Vaccine  Aged Out   COVID-19 Vaccine  Discontinued       06/27/2024   10:22 AM  Advanced Directives  Does Patient Have a Medical Advance Directive? No  Would patient like information on creating a medical advance directive? No - Patient declined    Vision: Annual vision screenings are recommended for early detection of glaucoma, cataracts, and diabetic retinopathy. These exams can also reveal signs of chronic conditions  such as diabetes and high blood pressure.  Dental: Annual dental screenings help detect early signs of oral cancer, gum disease, and other conditions linked to overall health, including heart disease and diabetes.

## 2024-06-27 NOTE — Progress Notes (Signed)
 "  Chief Complaint  Patient presents with   Medicare Wellness     Subjective:   Jennifer Waters is a 64 y.o. female who presents for a Medicare Annual Wellness Visit.  Visit info / Clinical Intake: Medicare Wellness Visit Type:: Subsequent Annual Wellness Visit Persons participating in visit and providing information:: patient Medicare Wellness Visit Mode:: Telephone If telephone:: video declined Since this visit was completed virtually, some vitals may be partially provided or unavailable. Missing vitals are due to the limitations of the virtual format.: Documented vitals are patient reported If Telephone or Video please confirm:: I connected with patient using audio/video enable telemedicine. I verified patient identity with two identifiers, discussed telehealth limitations, and patient agreed to proceed. Patient Location:: Home Provider Location:: Office Interpreter Needed?: No Pre-visit prep was completed: yes AWV questionnaire completed by patient prior to visit?: no Living arrangements:: (!) lives alone Patient's Overall Health Status Rating: good Typical amount of pain: none Does pain affect daily life?: no Are you currently prescribed opioids?: no  Dietary Habits and Nutritional Risks How many meals a day?: 2 Eats fruit and vegetables daily?: yes Most meals are obtained by: preparing own meals; eating out In the last 2 weeks, have you had any of the following?: none Diabetic:: (!) yes Any non-healing wounds?: no How often do you check your BS?: 0 Would you like to be referred to a Nutritionist or for Diabetic Management? : no  Functional Status Activities of Daily Living (to include ambulation/medication): Independent Ambulation: Independent with device- listed below Home Assistive Devices/Equipment: Eyeglasses Medication Administration: Independent Home Management (perform basic housework or laundry): Independent Manage your own finances?: yes Primary  transportation is: driving Concerns about vision?: no *vision screening is required for WTM* Concerns about hearing?: no  Fall Screening Falls in the past year?: 0 Number of falls in past year: 0 Was there an injury with Fall?: 0 Fall Risk Category Calculator: 0 Patient Fall Risk Level: Low Fall Risk  Fall Risk Patient at Risk for Falls Due to: No Fall Risks Fall risk Follow up: Falls evaluation completed; Falls prevention discussed  Home and Transportation Safety: All rugs have non-skid backing?: N/A, no rugs All stairs or steps have railings?: N/A, no stairs Grab bars in the bathtub or shower?: yes Have non-skid surface in bathtub or shower?: yes Good home lighting?: yes Regular seat belt use?: yes Hospital stays in the last year:: (!) yes How many hospital stays:: 2 Reason: SOB  Cognitive Assessment Difficulty concentrating, remembering, or making decisions? : no Will 6CIT or Mini Cog be Completed: yes What year is it?: 0 points What month is it?: 0 points Give patient an address phrase to remember (5 components): [address] [place], [place] About what time is it?: 0 points Count backwards from 20 to 1: 0 points Say the months of the year in reverse: 0 points Repeat the address phrase from earlier: 2 points ([place]) 6 CIT Score: 2 points  Advance Directives (For Healthcare) Does Patient Have a Medical Advance Directive?: No Would patient like information on creating a medical advance directive?: No - Patient declined  Reviewed/Updated  Reviewed/Updated: Reviewed All (Medical, Surgical, Family, Medications, Allergies, Care Teams, Patient Goals)    Allergies (verified) Chantix  [varenicline ]   Current Medications (verified) Outpatient Encounter Medications as of 06/27/2024  Medication Sig   Acetaminophen  500 MG capsule 1 capsule as needed Orally every 6 hrs   albuterol  (VENTOLIN  HFA) 108 (90 Base) MCG/ACT inhaler Inhale 2 puffs into the lungs  every 6 (six) hours  as needed for wheezing or shortness of breath.   amLODipine  (NORVASC ) 10 MG tablet Take 1 tablet (10 mg total) by mouth daily.   atorvastatin  (LIPITOR) 10 MG tablet Take 1 tablet (10 mg total) by mouth daily.   benzonatate  (TESSALON ) 100 MG capsule Take 2 capsules (200 mg total) by mouth 2 (two) times daily as needed for cough.   buPROPion  (WELLBUTRIN  SR) 150 MG 12 hr tablet Take 1 tablet (150 mg total) by mouth 2 (two) times daily.   cetirizine  (ZYRTEC ) 10 MG tablet 1 tablet   cyclobenzaprine  (FLEXERIL ) 5 MG tablet TAKE 1 TABLET(5 MG) BY MOUTH THREE TIMES DAILY AS NEEDED FOR MUSCLE SPASMS   fluticasone  (FLONASE ) 50 MCG/ACT nasal spray Place 2 sprays into both nostrils daily.   fluticasone  furoate-vilanterol (BREO ELLIPTA ) 100-25 MCG/ACT AEPB Inhale 1 puff into the lungs daily.   gabapentin  (NEURONTIN ) 300 MG capsule Take 1 capsule (300 mg total) by mouth daily after supper.   losartan -hydrochlorothiazide  (HYZAAR) 50-12.5 MG tablet Take 1 tablet by mouth daily.   meloxicam  (MOBIC ) 15 MG tablet Take 1 tablet (15 mg total) by mouth daily as needed for pain.   metFORMIN  (GLUCOPHAGE -XR) 500 MG 24 hr tablet TAKE 2 TABLETS(1000 MG) BY MOUTH DAILY WITH BREAKFAST   nicotine  polacrilex (COMMIT) 2 MG lozenge Take 1 lozenge (2 mg total) by mouth as needed for smoking cessation.   omeprazole  (PRILOSEC) 20 MG capsule TAKE 1 CAPSULE BY MOUTH EVERY DAY   pantoprazole  (PROTONIX ) 40 MG tablet Take 1 tablet (40 mg total) by mouth daily.   venlafaxine  XR (EFFEXOR -XR) 150 MG 24 hr capsule Take 1 capsule (150 mg total) by mouth daily with breakfast.   doxycycline  (VIBRA -TABS) 100 MG tablet Take 1 tablet (100 mg total) by mouth 2 (two) times daily. (Patient not taking: Reported on 06/27/2024)   promethazine -dextromethorphan (PROMETHAZINE -DM) 6.25-15 MG/5ML syrup Take 5 mLs by mouth 4 (four) times daily as needed. (Patient not taking: Reported on 06/27/2024)   No facility-administered encounter medications on file as of  06/27/2024.    History: Past Medical History:  Diagnosis Date   Arthritis    Depression    Hypertension    Migraines    Non-insulin  dependent type 2 diabetes mellitus (HCC) 09/18/2021   Obesity    Past Surgical History:  Procedure Laterality Date   ABDOMINAL HYSTERECTOMY     BREAST MASS EXCISION     benign done as a child   COLONOSCOPY  01/2019   TRANSTHORACIC ECHOCARDIOGRAM  12/2018    EF 55-60%.  Impaired relaxation but indeterminate filling pressures.  Normal RV size and function.  Normal valves.  Normal aortic root.   Family History  Problem Relation Age of Onset   Arthritis Mother    Ovarian cancer Mother    Migraines Mother    Lung cancer Father    Arrhythmia Sister    Brain cancer Brother    Edema Sister    Arthritis Sister    Diabetes Brother    Colon polyps Neg Hx    Colon cancer Neg Hx    Esophageal cancer Neg Hx    Rectal cancer Neg Hx    Stomach cancer Neg Hx    Social History   Occupational History   Occupation: Disablity  Tobacco Use   Smoking status: Former    Current packs/day: 0.00    Average packs/day: 0.5 packs/day for 20.0 years (10.0 ttl pk-yrs)    Types: Cigarettes    Start date:  05/21/2001    Quit date: 05/21/2021    Years since quitting: 3.1   Smokeless tobacco: Never   Tobacco comments:    Smokes half a pack a day   Vaping Use   Vaping status: Never Used  Substance and Sexual Activity   Alcohol use: Yes    Alcohol/week: 5.0 standard drinks of alcohol    Types: 5 Glasses of wine per week   Drug use: Not Currently    Frequency: 1.0 times per week    Types: Marijuana   Sexual activity: Yes    Birth control/protection: Surgical   Tobacco Counseling Counseling given: Yes Tobacco comments: Smokes half a pack a day   SDOH Screenings   Food Insecurity: No Food Insecurity (06/27/2024)  Housing: Unknown (06/27/2024)  Transportation Needs: No Transportation Needs (06/27/2024)  Utilities: Not At Risk (06/27/2024)  Alcohol Screen: Low Risk  (06/23/2023)  Depression (PHQ2-9): Low Risk (06/27/2024)  Financial Resource Strain: Low Risk (06/23/2023)  Physical Activity: Inactive (06/27/2024)  Social Connections: Moderately Isolated (06/27/2024)  Stress: No Stress Concern Present (06/27/2024)  Tobacco Use: Medium Risk (06/27/2024)  Health Literacy: Adequate Health Literacy (06/27/2024)   See flowsheets for full screening details  Depression Screen PHQ 2 & 9 Depression Scale- Over the past 2 weeks, how often have you been bothered by any of the following problems? Little interest or pleasure in doing things: 0 Feeling down, depressed, or hopeless (PHQ Adolescent also includes...irritable): 0 PHQ-2 Total Score: 0 Trouble falling or staying asleep, or sleeping too much: 0 Feeling tired or having little energy: 0 Poor appetite or overeating (PHQ Adolescent also includes...weight loss): 0 Feeling bad about yourself - or that you are a failure or have let yourself or your family down: 0 Trouble concentrating on things, such as reading the newspaper or watching television (PHQ Adolescent also includes...like school work): 0 Moving or speaking so slowly that other people could have noticed. Or the opposite - being so fidgety or restless that you have been moving around a lot more than usual: 0 Thoughts that you would be better off dead, or of hurting yourself in some way: 0 PHQ-9 Total Score: 0 If you checked off any problems, how difficult have these problems made it for you to do your work, take care of things at home, or get along with other people?: Not difficult at all  Depression Treatment Depression Interventions/Treatment : EYV7-0 Score <4 Follow-up Not Indicated     Goals Addressed               This Visit's Progress     Patient Stated (pt-stated)        Patient stated she plans to continue managing health             Objective:    Today's Vitals   06/27/24 1019  Weight: 236 lb (107 kg)  Height: 5' 9 (1.753 m)   Body  mass index is 34.85 kg/m.  Hearing/Vision screen Hearing Screening - Comments:: Denies hearing difficulties   Vision Screening - Comments:: Wears rx glasses - plans to schedule an appt w/Optometrist Immunizations and Health Maintenance Health Maintenance  Topic Date Due   OPHTHALMOLOGY EXAM  10/25/2023   HEMOGLOBIN A1C  04/25/2024   Diabetic kidney evaluation - Urine ACR  10/23/2024   FOOT EXAM  10/23/2024   Mammogram  10/24/2024   Diabetic kidney evaluation - eGFR measurement  02/23/2025   Medicare Annual Wellness (AWV)  06/27/2025   Colonoscopy  02/23/2026   DTaP/Tdap/Td (  2 - Td or Tdap) 06/04/2027   Pneumococcal Vaccine: 50+ Years  Completed   Influenza Vaccine  Completed   Hepatitis C Screening  Completed   HIV Screening  Completed   Zoster Vaccines- Shingrix   Completed   Hepatitis B Vaccines 19-59 Average Risk  Aged Out   HPV VACCINES  Aged Out   Meningococcal B Vaccine  Aged Out   COVID-19 Vaccine  Discontinued        Assessment/Plan:  This is a routine wellness examination for Yanni.  Ophthalmologist status: pt plans to schedule an appt to have eye exam  Patient Care Team: Norleen Lynwood ORN, MD as PCP - General (Internal Medicine) Anner Alm ORN, MD as PCP - Cardiology (Cardiology)  I have personally reviewed and noted the following in the patients chart:   Medical and social history Use of alcohol, tobacco or illicit drugs  Current medications and supplements including opioid prescriptions. Functional ability and status Nutritional status Physical activity Advanced directives List of other physicians Hospitalizations, surgeries, and ER visits in previous 12 months Vitals Screenings to include cognitive, depression, and falls Referrals and appointments  No orders of the defined types were placed in this encounter.  In addition, I have reviewed and discussed with patient certain preventive protocols, quality metrics, and best practice recommendations. A  written personalized care plan for preventive services as well as general preventive health recommendations were provided to patient.   Verdie CHRISTELLA Saba, CMA   06/27/2024   Return in 1 year (on 06/27/2025).  After Visit Summary: (Declined) Due to this being a telephonic visit, with patients personalized plan was offered to patient but patient Declined AVS at this time   Nurse Notes: Appointment(s) made: (scheduled 2027 AWV appt)  "

## 2024-07-23 ENCOUNTER — Telehealth: Payer: Self-pay

## 2024-07-23 ENCOUNTER — Other Ambulatory Visit (HOSPITAL_COMMUNITY): Payer: Self-pay

## 2024-07-23 NOTE — Telephone Encounter (Signed)
 Pharmacy Patient Advocate Encounter   Received notification from [facility] that prior authorization for Venlafaxine  ER 150 caps is required/requested.   Insurance verification completed.   The patient is insured through [facility].   Per test claim: The current 90 day co-pay is, $15.00.  No PA needed at this time. This test claim was processed through [facility]- copay amounts may vary at other pharmacies due to pharmacy/plan contracts, or as the patient moves through the different stages of their insurance plan.

## 2024-09-20 ENCOUNTER — Encounter: Admitting: Nurse Practitioner

## 2025-06-28 ENCOUNTER — Ambulatory Visit
